# Patient Record
Sex: Female | Born: 1950 | Race: White | Hispanic: No | Smoking: Never smoker
Health system: Southern US, Community
[De-identification: ages and names within clinical notes are randomized; demographics above are authoritative.]

## PROBLEM LIST (undated history)

## (undated) ENCOUNTER — Emergency Department (HOSPITAL_COMMUNITY): Payer: No Typology Code available for payment source

## (undated) DIAGNOSIS — M1990 Unspecified osteoarthritis, unspecified site: Secondary | ICD-10-CM

## (undated) DIAGNOSIS — Z8572 Personal history of non-Hodgkin lymphomas: Secondary | ICD-10-CM

## (undated) DIAGNOSIS — H539 Unspecified visual disturbance: Secondary | ICD-10-CM

## (undated) DIAGNOSIS — F32A Depression, unspecified: Secondary | ICD-10-CM

## (undated) DIAGNOSIS — T7840XA Allergy, unspecified, initial encounter: Secondary | ICD-10-CM

## (undated) DIAGNOSIS — I10 Essential (primary) hypertension: Secondary | ICD-10-CM

## (undated) DIAGNOSIS — R51 Headache: Secondary | ICD-10-CM

## (undated) DIAGNOSIS — F419 Anxiety disorder, unspecified: Secondary | ICD-10-CM

## (undated) DIAGNOSIS — R011 Cardiac murmur, unspecified: Secondary | ICD-10-CM

## (undated) DIAGNOSIS — C801 Malignant (primary) neoplasm, unspecified: Secondary | ICD-10-CM

## (undated) DIAGNOSIS — R519 Headache, unspecified: Secondary | ICD-10-CM

## (undated) HISTORY — DX: Allergy, unspecified, initial encounter: T78.40XA

## (undated) HISTORY — DX: Cardiac murmur, unspecified: R01.1

## (undated) HISTORY — DX: Unspecified osteoarthritis, unspecified site: M19.90

## (undated) HISTORY — DX: Headache: R51

## (undated) HISTORY — PX: ABDOMINAL HYSTERECTOMY: SHX81

## (undated) HISTORY — DX: Headache, unspecified: R51.9

## (undated) HISTORY — PX: COLECTOMY: SHX59

## (undated) HISTORY — DX: Depression, unspecified: F32.A

## (undated) HISTORY — DX: Anxiety disorder, unspecified: F41.9

## (undated) HISTORY — DX: Unspecified visual disturbance: H53.9

## (undated) HISTORY — DX: Personal history of non-Hodgkin lymphomas: Z85.72

## (undated) HISTORY — PX: APPENDECTOMY: SHX54

---

## 1997-10-30 ENCOUNTER — Ambulatory Visit: Admission: RE | Admit: 1997-10-30 | Discharge: 1997-10-30 | Payer: Self-pay | Admitting: Family Medicine

## 1998-07-15 ENCOUNTER — Ambulatory Visit (HOSPITAL_COMMUNITY): Admission: RE | Admit: 1998-07-15 | Discharge: 1998-07-15 | Payer: Self-pay | Admitting: Obstetrics and Gynecology

## 1998-07-15 ENCOUNTER — Encounter: Payer: Self-pay | Admitting: Obstetrics and Gynecology

## 1999-08-10 ENCOUNTER — Encounter: Payer: Self-pay | Admitting: Obstetrics and Gynecology

## 1999-08-10 ENCOUNTER — Ambulatory Visit (HOSPITAL_COMMUNITY): Admission: RE | Admit: 1999-08-10 | Discharge: 1999-08-10 | Payer: Self-pay | Admitting: Obstetrics and Gynecology

## 2000-08-29 ENCOUNTER — Ambulatory Visit (HOSPITAL_COMMUNITY): Admission: RE | Admit: 2000-08-29 | Discharge: 2000-08-29 | Payer: Self-pay | Admitting: Obstetrics and Gynecology

## 2000-08-29 ENCOUNTER — Encounter: Payer: Self-pay | Admitting: Obstetrics and Gynecology

## 2001-09-01 ENCOUNTER — Ambulatory Visit (HOSPITAL_COMMUNITY): Admission: RE | Admit: 2001-09-01 | Discharge: 2001-09-01 | Payer: Self-pay | Admitting: Obstetrics & Gynecology

## 2001-09-01 ENCOUNTER — Encounter: Payer: Self-pay | Admitting: Obstetrics & Gynecology

## 2001-09-11 ENCOUNTER — Encounter: Admission: RE | Admit: 2001-09-11 | Discharge: 2001-09-11 | Payer: Self-pay | Admitting: Obstetrics & Gynecology

## 2001-09-11 ENCOUNTER — Encounter: Payer: Self-pay | Admitting: Family Medicine

## 2002-09-28 ENCOUNTER — Encounter: Payer: Self-pay | Admitting: Family Medicine

## 2002-09-28 ENCOUNTER — Ambulatory Visit (HOSPITAL_COMMUNITY): Admission: RE | Admit: 2002-09-28 | Discharge: 2002-09-28 | Payer: Self-pay | Admitting: Family Medicine

## 2006-01-28 ENCOUNTER — Emergency Department (HOSPITAL_COMMUNITY): Admission: EM | Admit: 2006-01-28 | Discharge: 2006-01-28 | Payer: Self-pay | Admitting: Emergency Medicine

## 2006-03-03 ENCOUNTER — Ambulatory Visit (HOSPITAL_COMMUNITY): Admission: RE | Admit: 2006-03-03 | Discharge: 2006-03-03 | Payer: Self-pay | Admitting: Obstetrics & Gynecology

## 2007-03-07 ENCOUNTER — Ambulatory Visit (HOSPITAL_COMMUNITY): Admission: RE | Admit: 2007-03-07 | Discharge: 2007-03-07 | Payer: Self-pay | Admitting: Family Medicine

## 2008-03-12 ENCOUNTER — Ambulatory Visit (HOSPITAL_COMMUNITY): Admission: RE | Admit: 2008-03-12 | Discharge: 2008-03-12 | Payer: Self-pay | Admitting: Family Medicine

## 2008-07-23 ENCOUNTER — Encounter: Admission: RE | Admit: 2008-07-23 | Discharge: 2008-07-23 | Payer: Self-pay | Admitting: Family Medicine

## 2009-03-13 ENCOUNTER — Ambulatory Visit (HOSPITAL_COMMUNITY): Admission: RE | Admit: 2009-03-13 | Discharge: 2009-03-13 | Payer: Self-pay | Admitting: Family Medicine

## 2009-04-08 ENCOUNTER — Emergency Department (HOSPITAL_COMMUNITY): Admission: EM | Admit: 2009-04-08 | Discharge: 2009-04-08 | Payer: Self-pay | Admitting: Emergency Medicine

## 2009-04-18 ENCOUNTER — Encounter: Admission: RE | Admit: 2009-04-18 | Discharge: 2009-04-18 | Payer: Self-pay | Admitting: Gastroenterology

## 2009-04-30 ENCOUNTER — Ambulatory Visit: Payer: Self-pay | Admitting: Hematology & Oncology

## 2009-05-05 ENCOUNTER — Ambulatory Visit: Payer: Self-pay | Admitting: Oncology

## 2009-05-06 ENCOUNTER — Ambulatory Visit: Payer: Self-pay | Admitting: Oncology

## 2009-05-07 LAB — CBC & DIFF AND RETIC
BASO%: 0.7 % (ref 0.0–2.0)
Basophils Absolute: 0.1 10*3/uL (ref 0.0–0.1)
EOS%: 2.5 % (ref 0.0–7.0)
Eosinophils Absolute: 0.2 10*3/uL (ref 0.0–0.5)
HCT: 34.3 % — ABNORMAL LOW (ref 34.8–46.6)
HGB: 9.9 g/dL — ABNORMAL LOW (ref 11.6–15.9)
Immature Retic Fract: 24.8 % — ABNORMAL HIGH (ref 0.00–10.70)
LYMPH%: 30.4 % (ref 14.0–49.7)
MCH: 19 pg — ABNORMAL LOW (ref 25.1–34.0)
MCHC: 28.9 g/dL — ABNORMAL LOW (ref 31.5–36.0)
MCV: 65.8 fL — ABNORMAL LOW (ref 79.5–101.0)
MONO#: 0.5 10*3/uL (ref 0.1–0.9)
MONO%: 5.6 % (ref 0.0–14.0)
NEUT#: 5.6 10*3/uL (ref 1.5–6.5)
NEUT%: 60.8 % (ref 38.4–76.8)
Platelets: 440 10*3/uL — ABNORMAL HIGH (ref 145–400)
RBC: 5.21 10*6/uL (ref 3.70–5.45)
RDW: 25 % — ABNORMAL HIGH (ref 11.2–14.5)
Retic %: 1.9 % — ABNORMAL HIGH (ref 0.50–1.50)
Retic Ct Abs: 98.99 10*3/uL — ABNORMAL HIGH (ref 18.30–72.70)
WBC: 9.2 10*3/uL (ref 3.9–10.3)
lymph#: 2.8 10*3/uL (ref 0.9–3.3)

## 2009-05-07 LAB — MORPHOLOGY: PLT EST: INCREASED

## 2009-05-07 LAB — PROTIME-INR
INR: 1.1 — ABNORMAL LOW (ref 2.00–3.50)
Protime: 13.2 Seconds (ref 10.6–13.4)

## 2009-05-08 ENCOUNTER — Other Ambulatory Visit: Admission: RE | Admit: 2009-05-08 | Discharge: 2009-05-08 | Payer: Self-pay | Admitting: Oncology

## 2009-05-08 LAB — SEDIMENTATION RATE: Sed Rate: 30 mm/hr — ABNORMAL HIGH (ref 0–22)

## 2009-05-09 LAB — EPSTEIN-BARR VIRUS NUCLEAR ANTIGEN ANTIBODY, IGG: EBV NA IgG: 0.38 {ISR}

## 2009-05-09 LAB — COMPREHENSIVE METABOLIC PANEL
ALT: 12 U/L (ref 0–35)
AST: 16 U/L (ref 0–37)
Albumin: 4.2 g/dL (ref 3.5–5.2)
Alkaline Phosphatase: 87 U/L (ref 39–117)
BUN: 9 mg/dL (ref 6–23)
CO2: 27 mEq/L (ref 19–32)
Calcium: 9.4 mg/dL (ref 8.4–10.5)
Chloride: 98 mEq/L (ref 96–112)
Creatinine, Ser: 0.81 mg/dL (ref 0.40–1.20)
Glucose, Bld: 110 mg/dL — ABNORMAL HIGH (ref 70–99)
Potassium: 3.6 mEq/L (ref 3.5–5.3)
Sodium: 138 mEq/L (ref 135–145)
Total Bilirubin: 0.4 mg/dL (ref 0.3–1.2)
Total Protein: 7.5 g/dL (ref 6.0–8.3)

## 2009-05-09 LAB — EPSTEIN-BARR VIRUS VCA, IGM: EBV VCA IgM: 0.05 {ISR}

## 2009-05-09 LAB — HEPATITIS B SURFACE ANTIGEN: Hepatitis B Surface Ag: NEGATIVE

## 2009-05-09 LAB — HEPATITIS C ANTIBODY: HCV Ab: NEGATIVE

## 2009-05-09 LAB — CMV IGM: CMV IgM: <8 AU/mL (ref <30.0)

## 2009-05-09 LAB — LACTATE DEHYDROGENASE: LDH: 157 U/L (ref 94–250)

## 2009-05-09 LAB — HEPATITIS A ANTIBODY, IGM: Hep A IgM: NEGATIVE

## 2009-05-09 LAB — HIV ANTIBODY (ROUTINE TESTING W REFLEX)

## 2009-05-09 LAB — CYTOMEGALOVIRUS ANTIBODY, IGG: Cytomegalovirus Ab-IgG: <0.2 IU/mL (ref <0.4)

## 2009-05-09 LAB — BETA 2 MICROGLOBULIN, SERUM: Beta-2 Microglobulin: 2.68 mg/L — ABNORMAL HIGH (ref 1.01–1.73)

## 2009-05-09 LAB — URIC ACID: Uric Acid, Serum: 5.9 mg/dL (ref 2.4–7.0)

## 2009-05-09 LAB — EPSTEIN-BARR VIRUS VCA, IGG: EBV VCA IgG: 5.93 {ISR} — ABNORMAL HIGH

## 2009-05-09 LAB — EPSTEIN-BARR VIRUS EARLY D ANTIGEN ANTIBODY, IGG: EBV EA IgG: 1.42 {ISR} — ABNORMAL HIGH

## 2009-05-13 ENCOUNTER — Ambulatory Visit (HOSPITAL_COMMUNITY): Admission: RE | Admit: 2009-05-13 | Discharge: 2009-05-13 | Payer: Self-pay | Admitting: Oncology

## 2009-05-13 ENCOUNTER — Encounter: Payer: Self-pay | Admitting: Oncology

## 2009-05-14 ENCOUNTER — Inpatient Hospital Stay (HOSPITAL_COMMUNITY): Admission: RE | Admit: 2009-05-14 | Discharge: 2009-05-19 | Payer: Self-pay | Admitting: Surgery

## 2009-05-14 ENCOUNTER — Encounter (INDEPENDENT_AMBULATORY_CARE_PROVIDER_SITE_OTHER): Payer: Self-pay | Admitting: Surgery

## 2009-06-05 ENCOUNTER — Ambulatory Visit: Payer: Self-pay | Admitting: Oncology

## 2009-06-05 ENCOUNTER — Ambulatory Visit (HOSPITAL_COMMUNITY): Admission: RE | Admit: 2009-06-05 | Discharge: 2009-06-05 | Payer: Self-pay | Admitting: Surgery

## 2009-06-06 LAB — CBC WITH DIFFERENTIAL/PLATELET
BASO%: 0.6 % (ref 0.0–2.0)
Basophils Absolute: 0.1 10*3/uL (ref 0.0–0.1)
EOS%: 6.1 % (ref 0.0–7.0)
Eosinophils Absolute: 0.5 10*3/uL (ref 0.0–0.5)
HCT: 34.5 % — ABNORMAL LOW (ref 34.8–46.6)
HGB: 10.2 g/dL — ABNORMAL LOW (ref 11.6–15.9)
LYMPH%: 36.9 % (ref 14.0–49.7)
MCH: 20.4 pg — ABNORMAL LOW (ref 25.1–34.0)
MCHC: 29.6 g/dL — ABNORMAL LOW (ref 31.5–36.0)
MCV: 69 fL — ABNORMAL LOW (ref 79.5–101.0)
MONO#: 0.7 10*3/uL (ref 0.1–0.9)
MONO%: 8.5 % (ref 0.0–14.0)
NEUT#: 4.1 10*3/uL (ref 1.5–6.5)
NEUT%: 47.9 % (ref 38.4–76.8)
Platelets: 320 10*3/uL (ref 145–400)
RBC: 5 10*6/uL (ref 3.70–5.45)
RDW: 24.7 % — ABNORMAL HIGH (ref 11.2–14.5)
WBC: 8.5 10*3/uL (ref 3.9–10.3)
lymph#: 3.2 10*3/uL (ref 0.9–3.3)
nRBC: 0 % (ref 0–0)

## 2009-06-12 ENCOUNTER — Ambulatory Visit (HOSPITAL_COMMUNITY): Admission: RE | Admit: 2009-06-12 | Discharge: 2009-06-12 | Payer: Self-pay | Admitting: Oncology

## 2009-06-13 LAB — COMPREHENSIVE METABOLIC PANEL
ALT: 13 U/L (ref 0–35)
AST: 5 U/L (ref 0–37)
Albumin: 3.9 g/dL (ref 3.5–5.2)
Alkaline Phosphatase: 72 U/L (ref 39–117)
BUN: 10 mg/dL (ref 6–23)
CO2: 23 mEq/L (ref 19–32)
Calcium: 8.4 mg/dL (ref 8.4–10.5)
Chloride: 99 mEq/L (ref 96–112)
Creatinine, Ser: 0.75 mg/dL (ref 0.40–1.20)
Glucose, Bld: 186 mg/dL — ABNORMAL HIGH (ref 70–99)
Potassium: 3.9 mEq/L (ref 3.5–5.3)
Sodium: 136 mEq/L (ref 135–145)
Total Bilirubin: 0.9 mg/dL (ref 0.3–1.2)
Total Protein: 7 g/dL (ref 6.0–8.3)

## 2009-06-13 LAB — CBC WITH DIFFERENTIAL/PLATELET
BASO%: 1.4 % (ref 0.0–2.0)
Basophils Absolute: 0 10*3/uL (ref 0.0–0.1)
EOS%: 17.1 % — ABNORMAL HIGH (ref 0.0–7.0)
Eosinophils Absolute: 0.1 10*3/uL (ref 0.0–0.5)
HCT: 32.7 % — ABNORMAL LOW (ref 34.8–46.6)
HGB: 9.9 g/dL — ABNORMAL LOW (ref 11.6–15.9)
LYMPH%: 75.7 % — ABNORMAL HIGH (ref 14.0–49.7)
MCH: 20.6 pg — ABNORMAL LOW (ref 25.1–34.0)
MCHC: 30.3 g/dL — ABNORMAL LOW (ref 31.5–36.0)
MCV: 68.1 fL — ABNORMAL LOW (ref 79.5–101.0)
MONO#: 0 10*3/uL — ABNORMAL LOW (ref 0.1–0.9)
MONO%: 2.9 % (ref 0.0–14.0)
NEUT#: 0 10*3/uL — CL (ref 1.5–6.5)
NEUT%: 2.9 % — ABNORMAL LOW (ref 38.4–76.8)
Platelets: 91 10*3/uL — ABNORMAL LOW (ref 145–400)
RBC: 4.8 10*6/uL (ref 3.70–5.45)
RDW: 23.1 % — ABNORMAL HIGH (ref 11.2–14.5)
WBC: 0.7 10*3/uL — CL (ref 3.9–10.3)
lymph#: 0.5 10*3/uL — ABNORMAL LOW (ref 0.9–3.3)
nRBC: 0 % (ref 0–0)

## 2009-06-13 LAB — URIC ACID: Uric Acid, Serum: 2.4 mg/dL (ref 2.4–7.0)

## 2009-06-13 LAB — MORPHOLOGY: PLT EST: DECREASED

## 2009-06-13 LAB — LACTATE DEHYDROGENASE: LDH: 92 U/L — ABNORMAL LOW (ref 94–250)

## 2009-06-18 LAB — CBC WITH DIFFERENTIAL/PLATELET
BASO%: 1 % (ref 0.0–2.0)
Basophils Absolute: 0.1 10*3/uL (ref 0.0–0.1)
EOS%: 1 % (ref 0.0–7.0)
Eosinophils Absolute: 0.1 10*3/uL (ref 0.0–0.5)
HCT: 33.5 % — ABNORMAL LOW (ref 34.8–46.6)
HGB: 9.9 g/dL — ABNORMAL LOW (ref 11.6–15.9)
LYMPH%: 17.3 % (ref 14.0–49.7)
MCH: 20.8 pg — ABNORMAL LOW (ref 25.1–34.0)
MCHC: 29.6 g/dL — ABNORMAL LOW (ref 31.5–36.0)
MCV: 70.4 fL — ABNORMAL LOW (ref 79.5–101.0)
MONO#: 0.9 10*3/uL (ref 0.1–0.9)
MONO%: 7.2 % (ref 0.0–14.0)
NEUT#: 9 10*3/uL — ABNORMAL HIGH (ref 1.5–6.5)
NEUT%: 73.5 % (ref 38.4–76.8)
Platelets: 218 10*3/uL (ref 145–400)
RBC: 4.76 10*6/uL (ref 3.70–5.45)
RDW: 23.9 % — ABNORMAL HIGH (ref 11.2–14.5)
WBC: 12.2 10*3/uL — ABNORMAL HIGH (ref 3.9–10.3)
lymph#: 2.1 10*3/uL (ref 0.9–3.3)
nRBC: 1 % — ABNORMAL HIGH (ref 0–0)

## 2009-06-30 LAB — CBC WITH DIFFERENTIAL/PLATELET
BASO%: 3.8 % — ABNORMAL HIGH (ref 0.0–2.0)
Basophils Absolute: 0.3 10*3/uL — ABNORMAL HIGH (ref 0.0–0.1)
EOS%: 4.4 % (ref 0.0–7.0)
Eosinophils Absolute: 0.3 10*3/uL (ref 0.0–0.5)
HCT: 36 % (ref 34.8–46.6)
HGB: 11 g/dL — ABNORMAL LOW (ref 11.6–15.9)
LYMPH%: 26.1 % (ref 14.0–49.7)
MCH: 22 pg — ABNORMAL LOW (ref 25.1–34.0)
MCHC: 30.6 g/dL — ABNORMAL LOW (ref 31.5–36.0)
MCV: 72 fL — ABNORMAL LOW (ref 79.5–101.0)
MONO#: 0.7 10*3/uL (ref 0.1–0.9)
MONO%: 10.3 % (ref 0.0–14.0)
NEUT#: 3.7 10*3/uL (ref 1.5–6.5)
NEUT%: 55.4 % (ref 38.4–76.8)
Platelets: 492 10*3/uL — ABNORMAL HIGH (ref 145–400)
RBC: 5 10*6/uL (ref 3.70–5.45)
RDW: 24 % — ABNORMAL HIGH (ref 11.2–14.5)
WBC: 6.6 10*3/uL (ref 3.9–10.3)
lymph#: 1.7 10*3/uL (ref 0.9–3.3)
nRBC: 0 % (ref 0–0)

## 2009-07-04 LAB — COMPREHENSIVE METABOLIC PANEL
ALT: 9 U/L (ref 0–35)
AST: 9 U/L (ref 0–37)
Albumin: 3.8 g/dL (ref 3.5–5.2)
Alkaline Phosphatase: 78 U/L (ref 39–117)
BUN: 13 mg/dL (ref 6–23)
CO2: 25 mEq/L (ref 19–32)
Calcium: 8.6 mg/dL (ref 8.4–10.5)
Chloride: 103 mEq/L (ref 96–112)
Creatinine, Ser: 0.89 mg/dL (ref 0.40–1.20)
Glucose, Bld: 137 mg/dL — ABNORMAL HIGH (ref 70–99)
Potassium: 3.4 mEq/L — ABNORMAL LOW (ref 3.5–5.3)
Sodium: 140 mEq/L (ref 135–145)
Total Bilirubin: 0.5 mg/dL (ref 0.3–1.2)
Total Protein: 6.6 g/dL (ref 6.0–8.3)

## 2009-07-04 LAB — URIC ACID: Uric Acid, Serum: 2.7 mg/dL (ref 2.4–7.0)

## 2009-07-04 LAB — CBC WITH DIFFERENTIAL/PLATELET
BASO%: 1.4 % (ref 0.0–2.0)
Basophils Absolute: 0.5 10*3/uL — ABNORMAL HIGH (ref 0.0–0.1)
EOS%: 0 % (ref 0.0–7.0)
Eosinophils Absolute: 0 10*3/uL (ref 0.0–0.5)
HCT: 30.1 % — ABNORMAL LOW (ref 34.8–46.6)
HGB: 9.8 g/dL — ABNORMAL LOW (ref 11.6–15.9)
LYMPH%: 3.7 % — ABNORMAL LOW (ref 14.0–49.7)
MCH: 22.9 pg — ABNORMAL LOW (ref 25.1–34.0)
MCHC: 32.5 g/dL (ref 31.5–36.0)
MCV: 70.5 fL — ABNORMAL LOW (ref 79.5–101.0)
MONO#: 0.1 10*3/uL (ref 0.1–0.9)
MONO%: 0.2 % (ref 0.0–14.0)
NEUT#: 37.4 10*3/uL — ABNORMAL HIGH (ref 1.5–6.5)
NEUT%: 94.7 % — ABNORMAL HIGH (ref 38.4–76.8)
Platelets: 366 10*3/uL (ref 145–400)
RBC: 4.28 10*6/uL (ref 3.70–5.45)
RDW: 25.7 % — ABNORMAL HIGH (ref 11.2–14.5)
WBC: 39.5 10*3/uL — ABNORMAL HIGH (ref 3.9–10.3)
lymph#: 1.5 10*3/uL (ref 0.9–3.3)

## 2009-07-04 LAB — LACTATE DEHYDROGENASE: LDH: 148 U/L (ref 94–250)

## 2009-07-17 ENCOUNTER — Ambulatory Visit: Payer: Self-pay | Admitting: Oncology

## 2009-07-21 LAB — CBC WITH DIFFERENTIAL/PLATELET
BASO%: 2.6 % — ABNORMAL HIGH (ref 0.0–2.0)
Basophils Absolute: 0.2 10*3/uL — ABNORMAL HIGH (ref 0.0–0.1)
EOS%: 1.6 % (ref 0.0–7.0)
Eosinophils Absolute: 0.1 10*3/uL (ref 0.0–0.5)
HCT: 36.8 % (ref 34.8–46.6)
HGB: 11.2 g/dL — ABNORMAL LOW (ref 11.6–15.9)
LYMPH%: 23.7 % (ref 14.0–49.7)
MCH: 22.4 pg — ABNORMAL LOW (ref 25.1–34.0)
MCHC: 30.4 g/dL — ABNORMAL LOW (ref 31.5–36.0)
MCV: 73.6 fL — ABNORMAL LOW (ref 79.5–101.0)
MONO#: 0.6 10*3/uL (ref 0.1–0.9)
MONO%: 9.2 % (ref 0.0–14.0)
NEUT#: 3.9 10*3/uL (ref 1.5–6.5)
NEUT%: 62.9 % (ref 38.4–76.8)
Platelets: 391 10*3/uL (ref 145–400)
RBC: 5 10*6/uL (ref 3.70–5.45)
RDW: 22.6 % — ABNORMAL HIGH (ref 11.2–14.5)
WBC: 6.2 10*3/uL (ref 3.9–10.3)
lymph#: 1.5 10*3/uL (ref 0.9–3.3)

## 2009-08-04 LAB — CBC WITH DIFFERENTIAL/PLATELET
BASO%: 0.4 % (ref 0.0–2.0)
Basophils Absolute: 0 10*3/uL (ref 0.0–0.1)
EOS%: 0.9 % (ref 0.0–7.0)
Eosinophils Absolute: 0.1 10*3/uL (ref 0.0–0.5)
HCT: 33.8 % — ABNORMAL LOW (ref 34.8–46.6)
HGB: 11.2 g/dL — ABNORMAL LOW (ref 11.6–15.9)
LYMPH%: 9.5 % — ABNORMAL LOW (ref 14.0–49.7)
MCH: 24.2 pg — ABNORMAL LOW (ref 25.1–34.0)
MCHC: 33.1 g/dL (ref 31.5–36.0)
MCV: 72.9 fL — ABNORMAL LOW (ref 79.5–101.0)
MONO#: 0.6 10*3/uL (ref 0.1–0.9)
MONO%: 5.2 % (ref 0.0–14.0)
NEUT#: 9.6 10*3/uL — ABNORMAL HIGH (ref 1.5–6.5)
NEUT%: 84 % — ABNORMAL HIGH (ref 38.4–76.8)
Platelets: 243 10*3/uL (ref 145–400)
RBC: 4.64 10*6/uL (ref 3.70–5.45)
RDW: 25.1 % — ABNORMAL HIGH (ref 11.2–14.5)
WBC: 11.5 10*3/uL — ABNORMAL HIGH (ref 3.9–10.3)
lymph#: 1.1 10*3/uL (ref 0.9–3.3)

## 2009-08-04 LAB — COMPREHENSIVE METABOLIC PANEL
ALT: 14 U/L (ref 0–35)
AST: 12 U/L (ref 0–37)
Albumin: 4.1 g/dL (ref 3.5–5.2)
Alkaline Phosphatase: 86 U/L (ref 39–117)
BUN: 7 mg/dL (ref 6–23)
CO2: 23 mEq/L (ref 19–32)
Calcium: 9.1 mg/dL (ref 8.4–10.5)
Chloride: 105 mEq/L (ref 96–112)
Creatinine, Ser: 0.81 mg/dL (ref 0.40–1.20)
Glucose, Bld: 149 mg/dL — ABNORMAL HIGH (ref 70–99)
Potassium: 4 mEq/L (ref 3.5–5.3)
Sodium: 141 mEq/L (ref 135–145)
Total Bilirubin: 0.3 mg/dL (ref 0.3–1.2)
Total Protein: 6.9 g/dL (ref 6.0–8.3)

## 2009-08-04 LAB — LACTATE DEHYDROGENASE: LDH: 199 U/L (ref 94–250)

## 2009-08-11 LAB — CBC WITH DIFFERENTIAL/PLATELET
BASO%: 2.4 % — ABNORMAL HIGH (ref 0.0–2.0)
Basophils Absolute: 0.1 10*3/uL (ref 0.0–0.1)
EOS%: 1.7 % (ref 0.0–7.0)
Eosinophils Absolute: 0.1 10*3/uL (ref 0.0–0.5)
HCT: 36 % (ref 34.8–46.6)
HGB: 11.3 g/dL — ABNORMAL LOW (ref 11.6–15.9)
LYMPH%: 18.3 % (ref 14.0–49.7)
MCH: 23.6 pg — ABNORMAL LOW (ref 25.1–34.0)
MCHC: 31.4 g/dL — ABNORMAL LOW (ref 31.5–36.0)
MCV: 75.3 fL — ABNORMAL LOW (ref 79.5–101.0)
MONO#: 0.6 10*3/uL (ref 0.1–0.9)
MONO%: 10.9 % (ref 0.0–14.0)
NEUT#: 3.6 10*3/uL (ref 1.5–6.5)
NEUT%: 66.7 % (ref 38.4–76.8)
Platelets: 339 10*3/uL (ref 145–400)
RBC: 4.78 10*6/uL (ref 3.70–5.45)
RDW: 22.7 % — ABNORMAL HIGH (ref 11.2–14.5)
WBC: 5.4 10*3/uL (ref 3.9–10.3)
lymph#: 1 10*3/uL (ref 0.9–3.3)
nRBC: 0 % (ref 0–0)

## 2009-08-21 ENCOUNTER — Ambulatory Visit: Payer: Self-pay | Admitting: Oncology

## 2009-08-25 LAB — COMPREHENSIVE METABOLIC PANEL
ALT: 14 U/L (ref 0–35)
AST: 11 U/L (ref 0–37)
Albumin: 4 g/dL (ref 3.5–5.2)
Alkaline Phosphatase: 77 U/L (ref 39–117)
BUN: 6 mg/dL (ref 6–23)
CO2: 26 mEq/L (ref 19–32)
Calcium: 8.9 mg/dL (ref 8.4–10.5)
Chloride: 105 mEq/L (ref 96–112)
Creatinine, Ser: 0.92 mg/dL (ref 0.40–1.20)
Glucose, Bld: 135 mg/dL — ABNORMAL HIGH (ref 70–99)
Potassium: 3.9 mEq/L (ref 3.5–5.3)
Sodium: 142 mEq/L (ref 135–145)
Total Bilirubin: 0.4 mg/dL (ref 0.3–1.2)
Total Protein: 6.4 g/dL (ref 6.0–8.3)

## 2009-08-25 LAB — CBC WITH DIFFERENTIAL/PLATELET
BASO%: 0.5 % (ref 0.0–2.0)
Basophils Absolute: 0 10*3/uL (ref 0.0–0.1)
EOS%: 1.5 % (ref 0.0–7.0)
Eosinophils Absolute: 0.1 10*3/uL (ref 0.0–0.5)
HCT: 31.9 % — ABNORMAL LOW (ref 34.8–46.6)
HGB: 10.4 g/dL — ABNORMAL LOW (ref 11.6–15.9)
LYMPH%: 9.4 % — ABNORMAL LOW (ref 14.0–49.7)
MCH: 24.8 pg — ABNORMAL LOW (ref 25.1–34.0)
MCHC: 32.5 g/dL (ref 31.5–36.0)
MCV: 76.3 fL — ABNORMAL LOW (ref 79.5–101.0)
MONO#: 0.6 10*3/uL (ref 0.1–0.9)
MONO%: 6.9 % (ref 0.0–14.0)
NEUT#: 6.7 10*3/uL — ABNORMAL HIGH (ref 1.5–6.5)
NEUT%: 81.7 % — ABNORMAL HIGH (ref 38.4–76.8)
Platelets: 225 10*3/uL (ref 145–400)
RBC: 4.18 10*6/uL (ref 3.70–5.45)
RDW: 26 % — ABNORMAL HIGH (ref 11.2–14.5)
WBC: 8.2 10*3/uL (ref 3.9–10.3)
lymph#: 0.8 10*3/uL — ABNORMAL LOW (ref 0.9–3.3)

## 2009-08-25 LAB — LACTATE DEHYDROGENASE: LDH: 174 U/L (ref 94–250)

## 2009-09-01 LAB — CBC WITH DIFFERENTIAL/PLATELET
BASO%: 1.9 % (ref 0.0–2.0)
Basophils Absolute: 0.1 10*3/uL (ref 0.0–0.1)
EOS%: 1 % (ref 0.0–7.0)
Eosinophils Absolute: 0.1 10*3/uL (ref 0.0–0.5)
HCT: 39.2 % (ref 34.8–46.6)
HGB: 12.4 g/dL (ref 11.6–15.9)
LYMPH%: 18.2 % (ref 14.0–49.7)
MCH: 24.7 pg — ABNORMAL LOW (ref 25.1–34.0)
MCHC: 31.6 g/dL (ref 31.5–36.0)
MCV: 78.1 fL — ABNORMAL LOW (ref 79.5–101.0)
MONO#: 0.6 10*3/uL (ref 0.1–0.9)
MONO%: 8.8 % (ref 0.0–14.0)
NEUT#: 5.1 10*3/uL (ref 1.5–6.5)
NEUT%: 70.1 % (ref 38.4–76.8)
Platelets: 391 10*3/uL (ref 145–400)
RBC: 5.02 10*6/uL (ref 3.70–5.45)
RDW: 24.2 % — ABNORMAL HIGH (ref 11.2–14.5)
WBC: 7.3 10*3/uL (ref 3.9–10.3)
lymph#: 1.3 10*3/uL (ref 0.9–3.3)
nRBC: 0 % (ref 0–0)

## 2009-09-15 LAB — COMPREHENSIVE METABOLIC PANEL
ALT: 16 U/L (ref 0–35)
AST: 14 U/L (ref 0–37)
Albumin: 3.9 g/dL (ref 3.5–5.2)
Alkaline Phosphatase: 74 U/L (ref 39–117)
BUN: 4 mg/dL — ABNORMAL LOW (ref 6–23)
CO2: 27 mEq/L (ref 19–32)
Calcium: 8.5 mg/dL (ref 8.4–10.5)
Chloride: 106 mEq/L (ref 96–112)
Creatinine, Ser: 0.81 mg/dL (ref 0.40–1.20)
Glucose, Bld: 122 mg/dL — ABNORMAL HIGH (ref 70–99)
Potassium: 3.7 mEq/L (ref 3.5–5.3)
Sodium: 142 mEq/L (ref 135–145)
Total Bilirubin: 0.4 mg/dL (ref 0.3–1.2)
Total Protein: 5.9 g/dL — ABNORMAL LOW (ref 6.0–8.3)

## 2009-09-15 LAB — CBC WITH DIFFERENTIAL/PLATELET
BASO%: 0.3 % (ref 0.0–2.0)
Basophils Absolute: 0 10*3/uL (ref 0.0–0.1)
EOS%: 1.1 % (ref 0.0–7.0)
Eosinophils Absolute: 0.1 10*3/uL (ref 0.0–0.5)
HCT: 31.3 % — ABNORMAL LOW (ref 34.8–46.6)
HGB: 10.5 g/dL — ABNORMAL LOW (ref 11.6–15.9)
LYMPH%: 10.5 % — ABNORMAL LOW (ref 14.0–49.7)
MCH: 26.5 pg (ref 25.1–34.0)
MCHC: 33.6 g/dL (ref 31.5–36.0)
MCV: 78.8 fL — ABNORMAL LOW (ref 79.5–101.0)
MONO#: 0.6 10*3/uL (ref 0.1–0.9)
MONO%: 7.2 % (ref 0.0–14.0)
NEUT#: 7.2 10*3/uL — ABNORMAL HIGH (ref 1.5–6.5)
NEUT%: 80.9 % — ABNORMAL HIGH (ref 38.4–76.8)
Platelets: 223 10*3/uL (ref 145–400)
RBC: 3.97 10*6/uL (ref 3.70–5.45)
RDW: 26 % — ABNORMAL HIGH (ref 11.2–14.5)
WBC: 8.9 10*3/uL (ref 3.9–10.3)
lymph#: 0.9 10*3/uL (ref 0.9–3.3)

## 2009-09-15 LAB — LACTATE DEHYDROGENASE: LDH: 188 U/L (ref 94–250)

## 2009-09-22 ENCOUNTER — Ambulatory Visit: Payer: Self-pay | Admitting: Oncology

## 2009-09-22 LAB — CBC WITH DIFFERENTIAL/PLATELET
BASO%: 2.1 % — ABNORMAL HIGH (ref 0.0–2.0)
Basophils Absolute: 0.1 10*3/uL (ref 0.0–0.1)
EOS%: 1 % (ref 0.0–7.0)
Eosinophils Absolute: 0.1 10*3/uL (ref 0.0–0.5)
HCT: 37.6 % (ref 34.8–46.6)
HGB: 11.7 g/dL (ref 11.6–15.9)
LYMPH%: 19.9 % (ref 14.0–49.7)
MCH: 26.1 pg (ref 25.1–34.0)
MCHC: 31.1 g/dL — ABNORMAL LOW (ref 31.5–36.0)
MCV: 83.7 fL (ref 79.5–101.0)
MONO#: 0.5 10*3/uL (ref 0.1–0.9)
MONO%: 10.9 % (ref 0.0–14.0)
NEUT#: 3.2 10*3/uL (ref 1.5–6.5)
NEUT%: 66.1 % (ref 38.4–76.8)
Platelets: 296 10*3/uL (ref 145–400)
RBC: 4.49 10*6/uL (ref 3.70–5.45)
RDW: 22.7 % — ABNORMAL HIGH (ref 11.2–14.5)
WBC: 4.8 10*3/uL (ref 3.9–10.3)
lymph#: 1 10*3/uL (ref 0.9–3.3)
nRBC: 0 % (ref 0–0)

## 2009-10-09 ENCOUNTER — Ambulatory Visit (HOSPITAL_COMMUNITY): Admission: RE | Admit: 2009-10-09 | Discharge: 2009-10-09 | Payer: Self-pay | Admitting: Oncology

## 2009-10-09 LAB — COMPREHENSIVE METABOLIC PANEL
ALT: 19 U/L (ref 0–35)
AST: 18 U/L (ref 0–37)
Albumin: 4.2 g/dL (ref 3.5–5.2)
Alkaline Phosphatase: 71 U/L (ref 39–117)
BUN: 3 mg/dL — ABNORMAL LOW (ref 6–23)
CO2: 27 mEq/L (ref 19–32)
Calcium: 9.4 mg/dL (ref 8.4–10.5)
Chloride: 107 mEq/L (ref 96–112)
Creatinine, Ser: 0.86 mg/dL (ref 0.40–1.20)
Glucose, Bld: 117 mg/dL — ABNORMAL HIGH (ref 70–99)
Potassium: 4.3 mEq/L (ref 3.5–5.3)
Sodium: 141 mEq/L (ref 135–145)
Total Bilirubin: 0.5 mg/dL (ref 0.3–1.2)
Total Protein: 6.7 g/dL (ref 6.0–8.3)

## 2009-10-09 LAB — MORPHOLOGY: PLT EST: ADEQUATE

## 2009-10-09 LAB — CBC WITH DIFFERENTIAL/PLATELET
BASO%: 2.8 % — ABNORMAL HIGH (ref 0.0–2.0)
Basophils Absolute: 0.2 10*3/uL — ABNORMAL HIGH (ref 0.0–0.1)
EOS%: 0.5 % (ref 0.0–7.0)
Eosinophils Absolute: 0 10*3/uL (ref 0.0–0.5)
HCT: 35.5 % (ref 34.8–46.6)
HGB: 11.7 g/dL (ref 11.6–15.9)
LYMPH%: 9.2 % — ABNORMAL LOW (ref 14.0–49.7)
MCH: 27.4 pg (ref 25.1–34.0)
MCHC: 32.9 g/dL (ref 31.5–36.0)
MCV: 83.4 fL (ref 79.5–101.0)
MONO#: 0.4 10*3/uL (ref 0.1–0.9)
MONO%: 6.2 % (ref 0.0–14.0)
NEUT#: 5.5 10*3/uL (ref 1.5–6.5)
NEUT%: 81.3 % — ABNORMAL HIGH (ref 38.4–76.8)
Platelets: 373 10*3/uL (ref 145–400)
RBC: 4.26 10*6/uL (ref 3.70–5.45)
RDW: 21.9 % — ABNORMAL HIGH (ref 11.2–14.5)
WBC: 6.8 10*3/uL (ref 3.9–10.3)
lymph#: 0.6 10*3/uL — ABNORMAL LOW (ref 0.9–3.3)

## 2009-10-09 LAB — LACTATE DEHYDROGENASE: LDH: 247 U/L (ref 94–250)

## 2009-10-16 ENCOUNTER — Ambulatory Visit (HOSPITAL_COMMUNITY): Admission: RE | Admit: 2009-10-16 | Discharge: 2009-10-16 | Payer: Self-pay | Admitting: Oncology

## 2009-10-22 ENCOUNTER — Ambulatory Visit (HOSPITAL_COMMUNITY): Admission: RE | Admit: 2009-10-22 | Discharge: 2009-10-22 | Payer: Self-pay | Admitting: Surgery

## 2009-10-23 ENCOUNTER — Encounter: Payer: Self-pay | Admitting: Oncology

## 2009-10-23 ENCOUNTER — Ambulatory Visit: Admission: RE | Admit: 2009-10-23 | Discharge: 2009-10-23 | Payer: Self-pay | Admitting: Oncology

## 2009-10-23 ENCOUNTER — Ambulatory Visit: Payer: Self-pay | Admitting: Cardiology

## 2009-11-02 ENCOUNTER — Emergency Department (HOSPITAL_COMMUNITY): Admission: EM | Admit: 2009-11-02 | Discharge: 2009-11-02 | Payer: Self-pay | Admitting: Emergency Medicine

## 2010-02-14 ENCOUNTER — Other Ambulatory Visit: Payer: Self-pay | Admitting: Oncology

## 2010-02-14 DIAGNOSIS — C8590 Non-Hodgkin lymphoma, unspecified, unspecified site: Secondary | ICD-10-CM

## 2010-02-15 ENCOUNTER — Encounter: Payer: Self-pay | Admitting: Oncology

## 2010-03-02 ENCOUNTER — Other Ambulatory Visit (HOSPITAL_COMMUNITY): Payer: Self-pay | Admitting: Family Medicine

## 2010-03-02 DIAGNOSIS — Z1231 Encounter for screening mammogram for malignant neoplasm of breast: Secondary | ICD-10-CM

## 2010-03-16 ENCOUNTER — Ambulatory Visit (HOSPITAL_COMMUNITY): Payer: BC Managed Care – PPO

## 2010-03-17 ENCOUNTER — Ambulatory Visit (HOSPITAL_COMMUNITY)
Admission: RE | Admit: 2010-03-17 | Discharge: 2010-03-17 | Disposition: A | Discharge status: Home / Self Care | Payer: BC Managed Care – PPO | Source: Ambulatory Visit | Attending: Family Medicine | Admitting: Family Medicine

## 2010-03-17 DIAGNOSIS — Z1231 Encounter for screening mammogram for malignant neoplasm of breast: Secondary | ICD-10-CM

## 2010-03-31 ENCOUNTER — Other Ambulatory Visit: Payer: Self-pay | Admitting: Oncology

## 2010-03-31 ENCOUNTER — Ambulatory Visit (HOSPITAL_COMMUNITY)
Admission: RE | Admit: 2010-03-31 | Discharge: 2010-03-31 | Disposition: A | Discharge status: Home / Self Care | Payer: BC Managed Care – PPO | Source: Ambulatory Visit | Attending: Oncology | Admitting: Oncology

## 2010-03-31 ENCOUNTER — Encounter (HOSPITAL_BASED_OUTPATIENT_CLINIC_OR_DEPARTMENT_OTHER): Payer: BC Managed Care – PPO | Admitting: Oncology

## 2010-03-31 ENCOUNTER — Encounter (HOSPITAL_COMMUNITY): Payer: Self-pay

## 2010-03-31 DIAGNOSIS — Z5111 Encounter for antineoplastic chemotherapy: Secondary | ICD-10-CM

## 2010-03-31 DIAGNOSIS — Z5112 Encounter for antineoplastic immunotherapy: Secondary | ICD-10-CM

## 2010-03-31 DIAGNOSIS — C8589 Other specified types of non-Hodgkin lymphoma, extranodal and solid organ sites: Secondary | ICD-10-CM

## 2010-03-31 DIAGNOSIS — R634 Abnormal weight loss: Secondary | ICD-10-CM | POA: Insufficient documentation

## 2010-03-31 DIAGNOSIS — R1032 Left lower quadrant pain: Secondary | ICD-10-CM | POA: Insufficient documentation

## 2010-03-31 DIAGNOSIS — Z90710 Acquired absence of both cervix and uterus: Secondary | ICD-10-CM | POA: Insufficient documentation

## 2010-03-31 DIAGNOSIS — Z5189 Encounter for other specified aftercare: Secondary | ICD-10-CM

## 2010-03-31 DIAGNOSIS — Z9221 Personal history of antineoplastic chemotherapy: Secondary | ICD-10-CM | POA: Insufficient documentation

## 2010-03-31 DIAGNOSIS — C8590 Non-Hodgkin lymphoma, unspecified, unspecified site: Secondary | ICD-10-CM

## 2010-03-31 HISTORY — DX: Malignant (primary) neoplasm, unspecified: C80.1

## 2010-03-31 HISTORY — DX: Essential (primary) hypertension: I10

## 2010-03-31 LAB — MORPHOLOGY
PLT EST: ADEQUATE
RBC Comments: NORMAL

## 2010-03-31 LAB — CBC WITH DIFFERENTIAL/PLATELET
BASO%: 0.7 % (ref 0.0–2.0)
Basophils Absolute: 0 10*3/uL (ref 0.0–0.1)
EOS%: 3.3 % (ref 0.0–7.0)
Eosinophils Absolute: 0.2 10*3/uL (ref 0.0–0.5)
HCT: 44.5 % (ref 34.8–46.6)
HGB: 15.4 g/dL (ref 11.6–15.9)
LYMPH%: 17.9 % (ref 14.0–49.7)
MCH: 28.2 pg (ref 25.1–34.0)
MCHC: 34.5 g/dL (ref 31.5–36.0)
MCV: 81.8 fL (ref 79.5–101.0)
MONO#: 0.3 10*3/uL (ref 0.1–0.9)
MONO%: 6.8 % (ref 0.0–14.0)
NEUT#: 3.6 10*3/uL (ref 1.5–6.5)
NEUT%: 71.3 % (ref 38.4–76.8)
Platelets: 271 10*3/uL (ref 145–400)
RBC: 5.45 10*6/uL (ref 3.70–5.45)
RDW: 15.4 % — ABNORMAL HIGH (ref 11.2–14.5)
WBC: 5.1 10*3/uL (ref 3.9–10.3)
lymph#: 0.9 10*3/uL (ref 0.9–3.3)

## 2010-03-31 LAB — CMP (CANCER CENTER ONLY)
ALT(SGPT): 28 U/L (ref 10–47)
AST: 34 U/L (ref 11–38)
Albumin: 3.9 g/dL (ref 3.3–5.5)
Alkaline Phosphatase: 77 U/L (ref 26–84)
BUN, Bld: 8 mg/dL (ref 7–22)
CO2: 32 mEq/L (ref 18–33)
Calcium: 9.6 mg/dL (ref 8.0–10.3)
Chloride: 95 mEq/L — ABNORMAL LOW (ref 98–108)
Creat: 0.8 mg/dl (ref 0.6–1.2)
Glucose, Bld: 108 mg/dL (ref 73–118)
Potassium: 4.5 mEq/L (ref 3.3–4.7)
Sodium: 144 mEq/L (ref 128–145)
Total Bilirubin: 1 mg/dl (ref 0.20–1.60)
Total Protein: 7.9 g/dL (ref 6.4–8.1)

## 2010-03-31 LAB — LACTATE DEHYDROGENASE: LDH: 167 U/L (ref 94–250)

## 2010-03-31 LAB — SEDIMENTATION RATE: Sed Rate: 11 mm/hr (ref 0–22)

## 2010-03-31 MED ORDER — IOHEXOL 300 MG/ML  SOLN
100.0000 mL | Freq: Once | INTRAMUSCULAR | Status: AC | PRN
  Administered 2010-03-31: 100 mL via INTRAVENOUS

## 2010-04-07 ENCOUNTER — Encounter: Payer: BC Managed Care – PPO | Admitting: Oncology

## 2010-04-07 ENCOUNTER — Other Ambulatory Visit: Payer: Self-pay | Admitting: Oncology

## 2010-04-07 DIAGNOSIS — C8590 Non-Hodgkin lymphoma, unspecified, unspecified site: Secondary | ICD-10-CM

## 2010-04-09 LAB — CBC
HCT: 36.5 % (ref 36.0–46.0)
Hemoglobin: 12.1 g/dL (ref 12.0–15.0)
MCH: 27.6 pg (ref 26.0–34.0)
MCHC: 33.2 g/dL (ref 30.0–36.0)
MCV: 83.3 fL (ref 78.0–100.0)
Platelets: 245 10*3/uL (ref 150–400)
RBC: 4.39 MIL/uL (ref 3.87–5.11)
RDW: 17.9 % — ABNORMAL HIGH (ref 11.5–15.5)
WBC: 4.2 10*3/uL (ref 4.0–10.5)

## 2010-04-09 LAB — DIFFERENTIAL
Basophils Absolute: 0.1 10*3/uL (ref 0.0–0.1)
Basophils Relative: 1 % (ref 0–1)
Eosinophils Absolute: 0.3 10*3/uL (ref 0.0–0.7)
Eosinophils Relative: 7 % — ABNORMAL HIGH (ref 0–5)
Lymphocytes Relative: 25 % (ref 12–46)
Lymphs Abs: 1.1 10*3/uL (ref 0.7–4.0)
Monocytes Absolute: 0.5 10*3/uL (ref 0.1–1.0)
Monocytes Relative: 11 % (ref 3–12)
Neutro Abs: 2.4 10*3/uL (ref 1.7–7.7)
Neutrophils Relative %: 56 % (ref 43–77)

## 2010-04-09 LAB — POCT CARDIAC MARKERS
CKMB, poc: <1 ng/mL — ABNORMAL LOW (ref 1.0–8.0)
CKMB, poc: <1 ng/mL — ABNORMAL LOW (ref 1.0–8.0)
Myoglobin, poc: 32.2 ng/mL (ref 12–200)
Myoglobin, poc: 41.1 ng/mL (ref 12–200)
Troponin i, poc: <0.05 ng/mL (ref 0.00–0.09)
Troponin i, poc: <0.05 ng/mL (ref 0.00–0.09)

## 2010-04-09 LAB — BASIC METABOLIC PANEL
BUN: 8 mg/dL (ref 6–23)
CO2: 25 mEq/L (ref 19–32)
Calcium: 9.3 mg/dL (ref 8.4–10.5)
Chloride: 106 mEq/L (ref 96–112)
Creatinine, Ser: 0.77 mg/dL (ref 0.4–1.2)
GFR calc Af Amer: >60 mL/min (ref >60)
GFR calc non Af Amer: >60 mL/min (ref >60)
Glucose, Bld: 103 mg/dL — ABNORMAL HIGH (ref 70–99)
Potassium: 3.2 mEq/L — ABNORMAL LOW (ref 3.5–5.1)
Sodium: 140 mEq/L (ref 135–145)

## 2010-04-09 LAB — SURGICAL PCR SCREEN: Staphylococcus aureus: POSITIVE — AB

## 2010-04-09 LAB — GLUCOSE, CAPILLARY
Glucose-Capillary: 117 mg/dL — ABNORMAL HIGH (ref 70–99)
Glucose-Capillary: 127 mg/dL — ABNORMAL HIGH (ref 70–99)

## 2010-04-09 LAB — D-DIMER, QUANTITATIVE: D-Dimer, Quant: 0.76 ug/mL-FEU — ABNORMAL HIGH (ref 0.00–0.48)

## 2010-04-13 LAB — GLUCOSE, CAPILLARY: Glucose-Capillary: 104 mg/dL — ABNORMAL HIGH (ref 70–99)

## 2010-04-14 LAB — GLUCOSE, CAPILLARY
Glucose-Capillary: 120 mg/dL — ABNORMAL HIGH (ref 70–99)
Glucose-Capillary: 100 mg/dL — ABNORMAL HIGH (ref 70–99)
Glucose-Capillary: 110 mg/dL — ABNORMAL HIGH (ref 70–99)
Glucose-Capillary: 111 mg/dL — ABNORMAL HIGH (ref 70–99)
Glucose-Capillary: 111 mg/dL — ABNORMAL HIGH (ref 70–99)
Glucose-Capillary: 113 mg/dL — ABNORMAL HIGH (ref 70–99)
Glucose-Capillary: 120 mg/dL — ABNORMAL HIGH (ref 70–99)
Glucose-Capillary: 121 mg/dL — ABNORMAL HIGH (ref 70–99)
Glucose-Capillary: 127 mg/dL — ABNORMAL HIGH (ref 70–99)
Glucose-Capillary: 127 mg/dL — ABNORMAL HIGH (ref 70–99)
Glucose-Capillary: 128 mg/dL — ABNORMAL HIGH (ref 70–99)
Glucose-Capillary: 130 mg/dL — ABNORMAL HIGH (ref 70–99)
Glucose-Capillary: 136 mg/dL — ABNORMAL HIGH (ref 70–99)
Glucose-Capillary: 142 mg/dL — ABNORMAL HIGH (ref 70–99)
Glucose-Capillary: 146 mg/dL — ABNORMAL HIGH (ref 70–99)
Glucose-Capillary: 151 mg/dL — ABNORMAL HIGH (ref 70–99)
Glucose-Capillary: 160 mg/dL — ABNORMAL HIGH (ref 70–99)
Glucose-Capillary: 160 mg/dL — ABNORMAL HIGH (ref 70–99)
Glucose-Capillary: 170 mg/dL — ABNORMAL HIGH (ref 70–99)
Glucose-Capillary: 171 mg/dL — ABNORMAL HIGH (ref 70–99)
Glucose-Capillary: 178 mg/dL — ABNORMAL HIGH (ref 70–99)
Glucose-Capillary: 180 mg/dL — ABNORMAL HIGH (ref 70–99)
Glucose-Capillary: 208 mg/dL — ABNORMAL HIGH (ref 70–99)
Glucose-Capillary: 95 mg/dL (ref 70–99)
Glucose-Capillary: 99 mg/dL (ref 70–99)

## 2010-04-14 LAB — CROSSMATCH
ABO/RH(D): B POS
Antibody Screen: NEGATIVE

## 2010-04-14 LAB — CBC
HCT: 31.6 % — ABNORMAL LOW (ref 36.0–46.0)
HCT: 27.4 % — ABNORMAL LOW (ref 36.0–46.0)
HCT: 28.9 % — ABNORMAL LOW (ref 36.0–46.0)
Hemoglobin: 10.1 g/dL — ABNORMAL LOW (ref 12.0–15.0)
Hemoglobin: 8.5 g/dL — ABNORMAL LOW (ref 12.0–15.0)
Hemoglobin: 8.8 g/dL — ABNORMAL LOW (ref 12.0–15.0)
MCHC: 32 g/dL (ref 30.0–36.0)
MCHC: 30.6 g/dL (ref 30.0–36.0)
MCHC: 30.9 g/dL (ref 30.0–36.0)
MCV: 66.5 fL — ABNORMAL LOW (ref 78.0–100.0)
MCV: 66 fL — ABNORMAL LOW (ref 78.0–100.0)
MCV: 66 fL — ABNORMAL LOW (ref 78.0–100.0)
Platelets: 346 10*3/uL (ref 150–400)
Platelets: 375 10*3/uL (ref 150–400)
Platelets: 379 10*3/uL (ref 150–400)
RBC: 4.76 MIL/uL (ref 3.87–5.11)
RBC: 4.15 MIL/uL (ref 3.87–5.11)
RBC: 4.38 MIL/uL (ref 3.87–5.11)
RDW: 28.5 % — ABNORMAL HIGH (ref 11.5–15.5)
RDW: 31 % — ABNORMAL HIGH (ref 11.5–15.5)
RDW: 31.3 % — ABNORMAL HIGH (ref 11.5–15.5)
WBC: 6.5 10*3/uL (ref 4.0–10.5)
WBC: 12.3 10*3/uL — ABNORMAL HIGH (ref 4.0–10.5)
WBC: 9.4 10*3/uL (ref 4.0–10.5)

## 2010-04-14 LAB — BASIC METABOLIC PANEL
BUN: 5 mg/dL — ABNORMAL LOW (ref 6–23)
CO2: 29 mEq/L (ref 19–32)
Calcium: 9 mg/dL (ref 8.4–10.5)
Chloride: 107 mEq/L (ref 96–112)
Creatinine, Ser: 0.91 mg/dL (ref 0.4–1.2)
GFR calc Af Amer: >60 mL/min (ref >60)
GFR calc non Af Amer: >60 mL/min (ref >60)
Glucose, Bld: 109 mg/dL — ABNORMAL HIGH (ref 70–99)
Potassium: 3.7 mEq/L (ref 3.5–5.1)
Sodium: 140 mEq/L (ref 135–145)

## 2010-04-14 LAB — ABO/RH: ABO/RH(D): B POS

## 2010-04-15 LAB — BONE MARROW EXAM: Bone Marrow Exam: 277

## 2010-04-15 LAB — TISSUE HYBRIDIZATION (BONE MARROW)-NCBH

## 2010-04-15 LAB — CHROMOSOME ANALYSIS, BONE MARROW

## 2010-04-20 LAB — LIPASE, BLOOD: Lipase: 40 U/L (ref 11–59)

## 2010-04-20 LAB — COMPREHENSIVE METABOLIC PANEL
ALT: 13 U/L (ref 0–35)
AST: 17 U/L (ref 0–37)
Albumin: 3.6 g/dL (ref 3.5–5.2)
Alkaline Phosphatase: 82 U/L (ref 39–117)
BUN: 5 mg/dL — ABNORMAL LOW (ref 6–23)
CO2: 29 mEq/L (ref 19–32)
Calcium: 8.9 mg/dL (ref 8.4–10.5)
Chloride: 102 mEq/L (ref 96–112)
Creatinine, Ser: 0.84 mg/dL (ref 0.4–1.2)
GFR calc Af Amer: >60 mL/min (ref >60)
GFR calc non Af Amer: >60 mL/min (ref >60)
Glucose, Bld: 120 mg/dL — ABNORMAL HIGH (ref 70–99)
Potassium: 3.7 mEq/L (ref 3.5–5.1)
Sodium: 139 mEq/L (ref 135–145)
Total Bilirubin: 0.6 mg/dL (ref 0.3–1.2)
Total Protein: 7.5 g/dL (ref 6.0–8.3)

## 2010-04-20 LAB — DIFFERENTIAL
Band Neutrophils: 0 % (ref 0–10)
Basophils Absolute: 0 10*3/uL (ref 0.0–0.1)
Basophils Relative: 0 % (ref 0–1)
Eosinophils Absolute: 0 10*3/uL (ref 0.0–0.7)
Eosinophils Relative: 0 % (ref 0–5)
Lymphocytes Relative: 0 % — ABNORMAL LOW (ref 12–46)
Lymphs Abs: 0 10*3/uL — ABNORMAL LOW (ref 0.7–4.0)
Monocytes Absolute: 0 10*3/uL — ABNORMAL LOW (ref 0.1–1.0)
Monocytes Relative: 0 % — ABNORMAL LOW (ref 3–12)
Neutrophils Relative %: 0 % — ABNORMAL LOW (ref 43–77)
nRBC: 0 /100 WBC

## 2010-04-20 LAB — CBC
HCT: UNDETERMINED % (ref 36.0–46.0)
Hemoglobin: 8.6 g/dL — ABNORMAL LOW (ref 12.0–15.0)
MCHC: UNDETERMINED g/dL (ref 30.0–36.0)
MCV: UNDETERMINED fL (ref 78.0–100.0)
Platelets: 494 10*3/uL — ABNORMAL HIGH (ref 150–400)
RBC: UNDETERMINED MIL/uL (ref 3.87–5.11)
RDW: UNDETERMINED % (ref 11.5–15.5)
WBC: 8.3 10*3/uL (ref 4.0–10.5)

## 2010-08-20 ENCOUNTER — Encounter (HOSPITAL_BASED_OUTPATIENT_CLINIC_OR_DEPARTMENT_OTHER): Payer: BC Managed Care – PPO | Admitting: Oncology

## 2010-08-20 ENCOUNTER — Other Ambulatory Visit: Payer: Self-pay | Admitting: Oncology

## 2010-08-20 ENCOUNTER — Ambulatory Visit (HOSPITAL_COMMUNITY)
Admission: RE | Admit: 2010-08-20 | Discharge: 2010-08-20 | Disposition: A | Discharge status: Home / Self Care | Payer: BC Managed Care – PPO | Source: Ambulatory Visit | Attending: Oncology | Admitting: Oncology

## 2010-08-20 DIAGNOSIS — C8589 Other specified types of non-Hodgkin lymphoma, extranodal and solid organ sites: Secondary | ICD-10-CM | POA: Insufficient documentation

## 2010-08-20 DIAGNOSIS — C8590 Non-Hodgkin lymphoma, unspecified, unspecified site: Secondary | ICD-10-CM

## 2010-08-20 DIAGNOSIS — R1032 Left lower quadrant pain: Secondary | ICD-10-CM | POA: Insufficient documentation

## 2010-08-20 LAB — CBC WITH DIFFERENTIAL/PLATELET
BASO%: 0.8 % (ref 0.0–2.0)
Basophils Absolute: 0 10*3/uL (ref 0.0–0.1)
EOS%: 4.6 % (ref 0.0–7.0)
Eosinophils Absolute: 0.2 10*3/uL (ref 0.0–0.5)
HCT: 42.2 % (ref 34.8–46.6)
HGB: 14.6 g/dL (ref 11.6–15.9)
LYMPH%: 26.6 % (ref 14.0–49.7)
MCH: 29.6 pg (ref 25.1–34.0)
MCHC: 34.6 g/dL (ref 31.5–36.0)
MCV: 85.4 fL (ref 79.5–101.0)
MONO#: 0.4 10*3/uL (ref 0.1–0.9)
MONO%: 7.3 % (ref 0.0–14.0)
NEUT#: 3.2 10*3/uL (ref 1.5–6.5)
NEUT%: 60.7 % (ref 38.4–76.8)
Platelets: 250 10*3/uL (ref 145–400)
RBC: 4.95 10*6/uL (ref 3.70–5.45)
RDW: 13.1 % (ref 11.2–14.5)
WBC: 5.3 10*3/uL (ref 3.9–10.3)
lymph#: 1.4 10*3/uL (ref 0.9–3.3)

## 2010-08-20 LAB — MORPHOLOGY
PLT EST: ADEQUATE
RBC Comments: NORMAL

## 2010-08-20 LAB — CMP (CANCER CENTER ONLY)
ALT(SGPT): 25 U/L (ref 10–47)
AST: 31 U/L (ref 11–38)
Albumin: 3.6 g/dL (ref 3.3–5.5)
Alkaline Phosphatase: 81 U/L (ref 26–84)
BUN, Bld: 8 mg/dL (ref 7–22)
CO2: 30 mEq/L (ref 18–33)
Calcium: 9 mg/dL (ref 8.0–10.3)
Chloride: 97 mEq/L — ABNORMAL LOW (ref 98–108)
Creat: 0.7 mg/dl (ref 0.6–1.2)
Glucose, Bld: 109 mg/dL (ref 73–118)
Potassium: 4.2 mEq/L (ref 3.3–4.7)
Sodium: 144 mEq/L (ref 128–145)
Total Bilirubin: 1.4 mg/dl (ref 0.20–1.60)
Total Protein: 7.1 g/dL (ref 6.4–8.1)

## 2010-08-20 LAB — LACTATE DEHYDROGENASE: LDH: 175 U/L (ref 94–250)

## 2010-08-20 LAB — SEDIMENTATION RATE: Sed Rate: 13 mm/hr (ref 0–22)

## 2010-08-20 MED ORDER — IOHEXOL 300 MG/ML  SOLN
100.0000 mL | Freq: Once | INTRAMUSCULAR | Status: AC | PRN
  Administered 2010-08-20: 100 mL via INTRAVENOUS

## 2010-08-25 ENCOUNTER — Other Ambulatory Visit: Payer: Self-pay | Admitting: Oncology

## 2010-08-25 ENCOUNTER — Encounter (HOSPITAL_BASED_OUTPATIENT_CLINIC_OR_DEPARTMENT_OTHER): Payer: BC Managed Care – PPO | Admitting: Oncology

## 2010-08-25 DIAGNOSIS — C8590 Non-Hodgkin lymphoma, unspecified, unspecified site: Secondary | ICD-10-CM

## 2010-08-25 DIAGNOSIS — C8589 Other specified types of non-Hodgkin lymphoma, extranodal and solid organ sites: Secondary | ICD-10-CM

## 2010-12-09 ENCOUNTER — Telehealth: Payer: Self-pay

## 2010-12-09 NOTE — Telephone Encounter (Signed)
Received call from pt wanting to report to Dr Cyndie Chime that she has noticed an "enlarged area right above my left clavicle."  Pt reports it feels "bony; not like soft tissue."    Pt is scheduled for CT CAP tomorrow and wanted Dr Cyndie Chime to review this area when he receives scan results.   Note to MD.   dph

## 2010-12-10 ENCOUNTER — Telehealth: Payer: Self-pay | Admitting: *Deleted

## 2010-12-10 ENCOUNTER — Other Ambulatory Visit: Payer: Self-pay | Admitting: Oncology

## 2010-12-10 ENCOUNTER — Ambulatory Visit (HOSPITAL_COMMUNITY)
Admission: RE | Admit: 2010-12-10 | Discharge: 2010-12-10 | Disposition: A | Discharge status: Home / Self Care | Payer: BC Managed Care – PPO | Source: Ambulatory Visit | Attending: Oncology | Admitting: Oncology

## 2010-12-10 ENCOUNTER — Other Ambulatory Visit (HOSPITAL_BASED_OUTPATIENT_CLINIC_OR_DEPARTMENT_OTHER): Payer: BC Managed Care – PPO | Admitting: Lab

## 2010-12-10 DIAGNOSIS — C8589 Other specified types of non-Hodgkin lymphoma, extranodal and solid organ sites: Secondary | ICD-10-CM | POA: Insufficient documentation

## 2010-12-10 DIAGNOSIS — E876 Hypokalemia: Secondary | ICD-10-CM

## 2010-12-10 DIAGNOSIS — M47814 Spondylosis without myelopathy or radiculopathy, thoracic region: Secondary | ICD-10-CM | POA: Insufficient documentation

## 2010-12-10 DIAGNOSIS — M51379 Other intervertebral disc degeneration, lumbosacral region without mention of lumbar back pain or lower extremity pain: Secondary | ICD-10-CM | POA: Insufficient documentation

## 2010-12-10 DIAGNOSIS — C8590 Non-Hodgkin lymphoma, unspecified, unspecified site: Secondary | ICD-10-CM

## 2010-12-10 DIAGNOSIS — Z90710 Acquired absence of both cervix and uterus: Secondary | ICD-10-CM | POA: Insufficient documentation

## 2010-12-10 DIAGNOSIS — M5137 Other intervertebral disc degeneration, lumbosacral region: Secondary | ICD-10-CM | POA: Insufficient documentation

## 2010-12-10 LAB — CBC WITH DIFFERENTIAL/PLATELET
BASO%: 0.3 % (ref 0.0–2.0)
Basophils Absolute: 0 10*3/uL (ref 0.0–0.1)
EOS%: 4.4 % (ref 0.0–7.0)
Eosinophils Absolute: 0.3 10*3/uL (ref 0.0–0.5)
HCT: 42.9 % (ref 34.8–46.6)
HGB: 14.9 g/dL (ref 11.6–15.9)
LYMPH%: 30.8 % (ref 14.0–49.7)
MCH: 30.1 pg (ref 25.1–34.0)
MCHC: 34.8 g/dL (ref 31.5–36.0)
MCV: 86.3 fL (ref 79.5–101.0)
MONO#: 0.4 10*3/uL (ref 0.1–0.9)
MONO%: 6.8 % (ref 0.0–14.0)
NEUT#: 3.4 10*3/uL (ref 1.5–6.5)
NEUT%: 57.7 % (ref 38.4–76.8)
Platelets: 252 10*3/uL (ref 145–400)
RBC: 4.97 10*6/uL (ref 3.70–5.45)
RDW: 12.7 % (ref 11.2–14.5)
WBC: 5.8 10*3/uL (ref 3.9–10.3)
lymph#: 1.8 10*3/uL (ref 0.9–3.3)

## 2010-12-10 LAB — CMP (CANCER CENTER ONLY)
ALT(SGPT): 28 U/L (ref 10–47)
AST: 26 U/L (ref 11–38)
Albumin: 3.7 g/dL (ref 3.3–5.5)
Alkaline Phosphatase: 78 U/L (ref 26–84)
BUN, Bld: 8 mg/dL (ref 7–22)
CO2: 32 mEq/L (ref 18–33)
Calcium: 9.1 mg/dL (ref 8.0–10.3)
Chloride: 89 mEq/L — ABNORMAL LOW (ref 98–108)
Creat: 0.5 mg/dl — ABNORMAL LOW (ref 0.6–1.2)
Glucose, Bld: 118 mg/dL (ref 73–118)
Potassium: 3.1 mEq/L — ABNORMAL LOW (ref 3.3–4.7)
Sodium: 141 mEq/L (ref 128–145)
Total Bilirubin: 1.2 mg/dl (ref 0.20–1.60)
Total Protein: 7.5 g/dL (ref 6.4–8.1)

## 2010-12-10 LAB — LACTATE DEHYDROGENASE: LDH: 178 U/L (ref 94–250)

## 2010-12-10 MED ORDER — IOHEXOL 300 MG/ML  SOLN
100.0000 mL | Freq: Once | INTRAMUSCULAR | Status: AC | PRN
  Administered 2010-12-10: 100 mL via INTRAVENOUS

## 2010-12-10 MED ORDER — POTASSIUM CHLORIDE CRYS ER 20 MEQ PO TBCR: 20.0000 meq | EXTENDED_RELEASE_TABLET | Freq: Two times a day (BID) | ORAL | Status: DC

## 2010-12-10 NOTE — Telephone Encounter (Signed)
Pt. notified per Dr. Cyndie Chime that K+ low & to start K dur 20 meq bid.  I will call script to [facility], [address].

## 2011-02-08 ENCOUNTER — Ambulatory Visit (HOSPITAL_BASED_OUTPATIENT_CLINIC_OR_DEPARTMENT_OTHER): Payer: BC Managed Care – PPO | Admitting: Oncology

## 2011-02-08 ENCOUNTER — Ambulatory Visit (HOSPITAL_COMMUNITY)
Admission: RE | Admit: 2011-02-08 | Discharge: 2011-02-08 | Disposition: A | Discharge status: Home / Self Care | Payer: BC Managed Care – PPO | Source: Ambulatory Visit | Attending: Oncology | Admitting: Oncology

## 2011-02-08 ENCOUNTER — Telehealth: Payer: Self-pay

## 2011-02-08 ENCOUNTER — Encounter: Payer: Self-pay | Admitting: Oncology

## 2011-02-08 ENCOUNTER — Telehealth: Payer: Self-pay | Admitting: Oncology

## 2011-02-08 VITALS — BP 123/79 | HR 84 | Temp 97.8°F | Ht 66.0 in | Wt 195.8 lb

## 2011-02-08 DIAGNOSIS — F419 Anxiety disorder, unspecified: Secondary | ICD-10-CM

## 2011-02-08 DIAGNOSIS — C8589 Other specified types of non-Hodgkin lymphoma, extranodal and solid organ sites: Secondary | ICD-10-CM | POA: Insufficient documentation

## 2011-02-08 DIAGNOSIS — R229 Localized swelling, mass and lump, unspecified: Secondary | ICD-10-CM | POA: Insufficient documentation

## 2011-02-08 DIAGNOSIS — Z8572 Personal history of non-Hodgkin lymphomas: Secondary | ICD-10-CM | POA: Insufficient documentation

## 2011-02-08 DIAGNOSIS — C8590 Non-Hodgkin lymphoma, unspecified, unspecified site: Secondary | ICD-10-CM

## 2011-02-08 DIAGNOSIS — R079 Chest pain, unspecified: Secondary | ICD-10-CM | POA: Insufficient documentation

## 2011-02-08 DIAGNOSIS — F411 Generalized anxiety disorder: Secondary | ICD-10-CM

## 2011-02-08 HISTORY — DX: Personal history of non-Hodgkin lymphomas: Z85.72

## 2011-02-08 HISTORY — DX: Anxiety disorder, unspecified: F41.9

## 2011-02-08 NOTE — Telephone Encounter (Signed)
Sent pt to [facility] for xray of the sternum.  gv pt appt for march2013 and ct and cxr for 04/13/2011 @ 9am @ [facility]

## 2011-02-08 NOTE — Telephone Encounter (Signed)
Pt notified per Dr Cyndie Chime "xray normal."  Pt verbalizes understanding and appreciation. dph

## 2011-02-08 NOTE — Progress Notes (Signed)
Hematology and Oncology Follow Up Visit  Hannah Frazier [id number] [birth year] 61 y.o. 02/08/2011 7:28 PM   Principle Diagnosis: Encounter Diagnoses  Name Primary?  . NHL (non-Hodgkin's lymphoma) Yes  . Lymphoma, high grade   . 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 She underwent surgical resection 05/14/2009 followed by 6 cycles of CHOP Rituxan chemotherapy between May 13 and 09/22/2009. She's remained in a clinical and radiographic remission since that time including survey scans done in anticipation of today's visit on  12/10/2010.  She's had no interim medical problems. No fever or infection. She has noted asymmetric swelling in intermittent soreness at the sternal notch to the left side.  Medications: reviewed  Allergies:  Allergies  Allergen Reactions  . Lisinopril   . Simvastatin     Review of Systems: Constitutional:   No constitutional symptoms Respiratory: No dyspnea or cough Cardiovascular:  No chest pain or palpitations Gastrointestinal: No abdominal pain or change in bowel habit Genito-Urinary: No urinary tract symptoms Musculoskeletal: Pain in  upper sternal area Neurologic: No headache or change in vision Skin: No rash Remaining ROS negative.  Physical Exam: Blood pressure 123/79, pulse 84, temperature 97.8 F (36.6 C), temperature source Oral, height 5\' 6"  (1.676 m), weight 195 lb 12.8 oz (88.814 kg). Wt Readings from Last 3 Encounters:  02/08/11 195 lb 12.8 oz (88.814 kg)     General appearance: Well-nourished Caucasian woman HENNT: No erythema or exudate in the pharynx Lymph nodes: No cervical supraclavicular axillary or inguinal lymph nodes Breasts: Not examined Lungs: Clear to auscultation  resonant to percussion. Asymmetric prominence of the head of the sternum on the left with minimal tenderness on palpation no erythema skin lesion or skin breakdown. Heart: Regular cardiac rhythm no murmur Abdomen: Soft nontender no mass no organomegaly Extremities: No edema no calf tenderness Vascular: No cyanosis Neurologic: No focal deficit Skin: No rash or ecchymosis  Lab Results: Lab Results  Component Value Date   WBC 5.8 12/10/2010   HGB 14.9 12/10/2010   HCT 42.9 12/10/2010   MCV 86.3 12/10/2010   PLT 252 12/10/2010     Chemistry      Component Value Date/Time   NA 141 12/10/2010 0742   NA 140 11/02/2009 2004   K 3.1* 12/10/2010 0742   K 3.2* 11/02/2009 2004   CL 89* 12/10/2010 0742   CL 106 11/02/2009 2004   CO2 32 12/10/2010 0742   CO2 25 11/02/2009 2004   BUN 8 12/10/2010 0742   BUN 8 11/02/2009 2004   CREATININE 0.5* 12/10/2010 0742   CREATININE 0.77 11/02/2009 2004      Component Value Date/Time   CALCIUM 9.1 12/10/2010 0742   CALCIUM 9.3 11/02/2009 2004   ALKPHOS 78 12/10/2010 0742   ALKPHOS 71 10/09/2009 0735   AST 26 12/10/2010 0742   AST 18 10/09/2009 0735   ALT 19 10/09/2009 0735   BILITOT 1.20 12/10/2010 0742   BILITOT 0.5 10/09/2009 0735       Radiological Studies: I personally reviewed CT scan done November 15/ 12. No evidence for recurrent lymphoma.   Impression and Plan: #1. Extranodal high-grade B-cell non-Hodgkin's lymphoma involving the terminal ileum. Complete response to surgical resection followed by CHOP Rituxan chemotherapy. No  evidence for new disease now out almost 2 years from diagnosis in March of 2011.  #2. Soreness over the head of the left sternum. I doubt this is in anywhere related to her lymphoma. As means of reassurance I went ahead and sent her down for x-rays of the sternum and there are no abnormalities.  #3. Essential hypertension.  #4. Hyperlipidemia.  #5. Chronic anxiety and depression.   CC:. Dr. Benetta Spar Rankin's;  Dr. Luretha Murphy; Dr. Willis Modena   Levert Feinstein, MD 1/14/20137:28 PM

## 2011-03-04 ENCOUNTER — Other Ambulatory Visit (HOSPITAL_COMMUNITY): Payer: Self-pay | Admitting: Family Medicine

## 2011-03-04 DIAGNOSIS — Z1231 Encounter for screening mammogram for malignant neoplasm of breast: Secondary | ICD-10-CM

## 2011-03-31 ENCOUNTER — Ambulatory Visit (HOSPITAL_COMMUNITY)
Admission: RE | Admit: 2011-03-31 | Discharge: 2011-03-31 | Disposition: A | Discharge status: Home / Self Care | Payer: BC Managed Care – PPO | Source: Ambulatory Visit | Attending: Family Medicine | Admitting: Family Medicine

## 2011-03-31 DIAGNOSIS — Z1231 Encounter for screening mammogram for malignant neoplasm of breast: Secondary | ICD-10-CM

## 2011-04-13 ENCOUNTER — Ambulatory Visit (HOSPITAL_COMMUNITY)
Admission: RE | Admit: 2011-04-13 | Discharge: 2011-04-13 | Disposition: A | Discharge status: Home / Self Care | Payer: BC Managed Care – PPO | Source: Ambulatory Visit | Attending: Oncology | Admitting: Oncology

## 2011-04-13 ENCOUNTER — Other Ambulatory Visit (HOSPITAL_BASED_OUTPATIENT_CLINIC_OR_DEPARTMENT_OTHER): Payer: BC Managed Care – PPO | Admitting: Lab

## 2011-04-13 ENCOUNTER — Telehealth: Payer: Self-pay

## 2011-04-13 ENCOUNTER — Other Ambulatory Visit: Payer: Self-pay

## 2011-04-13 DIAGNOSIS — C8589 Other specified types of non-Hodgkin lymphoma, extranodal and solid organ sites: Secondary | ICD-10-CM | POA: Insufficient documentation

## 2011-04-13 DIAGNOSIS — I10 Essential (primary) hypertension: Secondary | ICD-10-CM | POA: Insufficient documentation

## 2011-04-13 DIAGNOSIS — C8590 Non-Hodgkin lymphoma, unspecified, unspecified site: Secondary | ICD-10-CM

## 2011-04-13 DIAGNOSIS — R222 Localized swelling, mass and lump, trunk: Secondary | ICD-10-CM | POA: Insufficient documentation

## 2011-04-13 LAB — CBC WITH DIFFERENTIAL/PLATELET
BASO%: 0.8 % (ref 0.0–2.0)
Basophils Absolute: 0.1 10*3/uL (ref 0.0–0.1)
EOS%: 4.4 % (ref 0.0–7.0)
Eosinophils Absolute: 0.3 10*3/uL (ref 0.0–0.5)
HCT: 42.2 % (ref 34.8–46.6)
HGB: 14.6 g/dL (ref 11.6–15.9)
LYMPH%: 29.5 % (ref 14.0–49.7)
MCH: 29.4 pg (ref 25.1–34.0)
MCHC: 34.6 g/dL (ref 31.5–36.0)
MCV: 85.1 fL (ref 79.5–101.0)
MONO#: 0.5 10*3/uL (ref 0.1–0.9)
MONO%: 7.3 % (ref 0.0–14.0)
NEUT#: 4.2 10*3/uL (ref 1.5–6.5)
NEUT%: 58 % (ref 38.4–76.8)
Platelets: 236 10*3/uL (ref 145–400)
RBC: 4.96 10*6/uL (ref 3.70–5.45)
RDW: 12.8 % (ref 11.2–14.5)
WBC: 7.3 10*3/uL (ref 3.9–10.3)
lymph#: 2.2 10*3/uL (ref 0.9–3.3)

## 2011-04-13 LAB — CMP (CANCER CENTER ONLY)
ALT(SGPT): 25 U/L (ref 10–47)
AST: 28 U/L (ref 11–38)
Albumin: 3.6 g/dL (ref 3.3–5.5)
Alkaline Phosphatase: 80 U/L (ref 26–84)
BUN, Bld: 10 mg/dL (ref 7–22)
CO2: 32 mEq/L (ref 18–33)
Calcium: 9.1 mg/dL (ref 8.0–10.3)
Chloride: 95 mEq/L — ABNORMAL LOW (ref 98–108)
Creat: 0.8 mg/dl (ref 0.6–1.2)
Glucose, Bld: 104 mg/dL (ref 73–118)
Potassium: 3.4 mEq/L (ref 3.3–4.7)
Sodium: 141 mEq/L (ref 128–145)
Total Bilirubin: 1.1 mg/dl (ref 0.20–1.60)
Total Protein: 7.5 g/dL (ref 6.4–8.1)

## 2011-04-13 LAB — MORPHOLOGY
PLT EST: ADEQUATE
RBC Comments: NORMAL

## 2011-04-13 LAB — LACTATE DEHYDROGENASE: LDH: 166 U/L (ref 94–250)

## 2011-04-13 MED ORDER — IOHEXOL 300 MG/ML  SOLN
100.0000 mL | Freq: Once | INTRAMUSCULAR | Status: AC | PRN
  Administered 2011-04-13: 100 mL via INTRAVENOUS

## 2011-04-13 NOTE — Telephone Encounter (Signed)
Pt notified of results per Dr Patsy Lager attached note. Verbalizes appreciation. dph

## 2011-04-13 NOTE — Telephone Encounter (Signed)
Message copied by Albertha Ghee on Tue Apr 13, 2011 12:31 PM ------      Message from: Levert Feinstein      Created: Tue Apr 13, 2011 12:23 PM       Call pt CT & CXR normal - no sign of lymphoma

## 2011-04-16 ENCOUNTER — Ambulatory Visit (HOSPITAL_BASED_OUTPATIENT_CLINIC_OR_DEPARTMENT_OTHER): Payer: BC Managed Care – PPO | Admitting: Nurse Practitioner

## 2011-04-16 ENCOUNTER — Telehealth: Payer: Self-pay | Admitting: Oncology

## 2011-04-16 VITALS — BP 134/88 | HR 78 | Temp 97.1°F | Ht 66.0 in | Wt 195.6 lb

## 2011-04-16 DIAGNOSIS — E785 Hyperlipidemia, unspecified: Secondary | ICD-10-CM

## 2011-04-16 DIAGNOSIS — I10 Essential (primary) hypertension: Secondary | ICD-10-CM

## 2011-04-16 DIAGNOSIS — C8589 Other specified types of non-Hodgkin lymphoma, extranodal and solid organ sites: Secondary | ICD-10-CM

## 2011-04-16 DIAGNOSIS — C8590 Non-Hodgkin lymphoma, unspecified, unspecified site: Secondary | ICD-10-CM

## 2011-04-16 DIAGNOSIS — F411 Generalized anxiety disorder: Secondary | ICD-10-CM

## 2011-04-16 NOTE — Telephone Encounter (Signed)
Gv pt appts fro sept2013.  scheduled cxr and ct scan for 09/17 @ [facility]

## 2011-04-16 NOTE — Progress Notes (Signed)
OFFICE PROGRESS NOTE  Interval history:  Hannah Frazier is a 61 year old woman diagnosed with stage IB extranodal high-grade B-cell non-Hodgkin's lymphoma in April 2011. She initially presented with abdominal pain and anemia. She was found to have a large soft tissue mass in the terminal ileum. She underwent surgical resection on 05/14/2009 followed by 6 cycles of CHOP/Rituxan chemotherapy between 06/06/2009 and 09/22/2009.  Restaging CT scans of the chest and abdomen on 04/13/2011 showed no evidence of active lymphoma. Chest x-ray, also on 04/13/2011, showed no acute or active cardiopulmonary or pleural abnormalities. No adenopathy or mass evident.  Hannah Frazier reports that overall she feels well. She is currently getting over a cold. No fevers or sweats. She has a good appetite. She is gaining weight. She continues to note swelling to the left of the sternum. She thinks the swelling is "less prominent". Bowels moving regularly. No abdominal pain. No hematuria or dysuria.   Objective: Blood pressure 134/88, pulse 78, temperature 97.1 F (36.2 C), temperature source Oral, height 5\' 6"  (1.676 m), weight 195 lb 9.6 oz (88.724 kg).  Oropharynx is without thrush or ulceration. No palpable cervical, supraclavicular, axillary or inguinal lymph nodes. Lungs are clear. No wheezes or rales. Regular cardiac rhythm. Abdomen is soft and nontender. No organomegaly. Extremities are without edema. Calves are soft and nontender. Slight fullness at the region of the left medial clavicle.  Lab Results: Lab Results  Component Value Date   WBC 7.3 04/13/2011   HGB 14.6 04/13/2011   HCT 42.2 04/13/2011   MCV 85.1 04/13/2011   PLT 236 04/13/2011    Chemistry:    Chemistry      Component Value Date/Time   NA 141 04/13/2011 0808   NA 140 11/02/2009 2004   K 3.4 04/13/2011 0808   K 3.2* 11/02/2009 2004   CL 95* 04/13/2011 0808   CL 106 11/02/2009 2004   CO2 32 04/13/2011 0808   CO2 25 11/02/2009 2004   BUN 10  04/13/2011 0808   BUN 8 11/02/2009 2004   CREATININE 0.8 04/13/2011 0808   CREATININE 0.77 11/02/2009 2004      Component Value Date/Time   CALCIUM 9.1 04/13/2011 0808   CALCIUM 9.3 11/02/2009 2004   ALKPHOS 80 04/13/2011 0808   ALKPHOS 71 10/09/2009 0735   AST 28 04/13/2011 0808   AST 18 10/09/2009 0735   ALT 19 10/09/2009 0735   BILITOT 1.10 04/13/2011 0808   BILITOT 0.5 10/09/2009 0735       Studies/Results: Dg Chest 2 View  04/13/2011  *RADIOLOGY REPORT*  Clinical Data: History of gastrointestinal lymphoma.  Followup. History of non-Hodgkins lymphoma.  History of knot medial to the left clavicle near junction with the sternum.  Hypertension.  CHEST - 2 VIEW  Comparison: 02/08/2011. 11/02/2009.  Findings: The cardiac silhouette is normal size and shape. Mediastinal and hilar contours appear stable.  No pulmonary infiltrates or nodules were evident. No pleural abnormality is evident.  No skeletal lesions are seen.  IMPRESSION: No acute or active cardiopulmonary or pleural abnormalities are seen.  No adenopathy or mass is evident.  History of knot medial to the left clavicle near junction with the sternum. I cannot visualize abnormality in this area by plain radiographic imaging.  Original Report Authenticated By: Crawford Givens, M.D.   Ct Abdomen Pelvis W Contrast  04/13/2011  *RADIOLOGY REPORT*  Clinical Data: Follow up of non-Hodgkins lymphoma of small bowel. Chemotherapy completed in 2011.  Partial bowel resection.  History of diabetes, hypertension, and  anxiety.  CT ABDOMEN AND PELVIS WITH CONTRAST  Technique:  Multidetector CT imaging of the abdomen and pelvis was performed following the standard protocol during bolus administration of intravenous contrast.  Contrast: OMNIPAQUE IOHEXOL 300 MG/ML IJ SOLN  Comparison: 12/10/2010  Findings: Minimal motion degradation at the lung bases. Clear lung bases.  Normal heart size without pericardial or pleural effusion. Normal liver, spleen, stomach,  pancreas, gallbladder, biliary tract, adrenal glands, kidneys.  Mildly age advanced atherosclerosis at the origin of bilateral renal arteries. No retroperitoneal or retrocrural adenopathy.  Normal colon and terminal ileum.  Normal small bowel without abdominal ascites.  No mesenteric adenopathy.  No pelvic adenopathy.  Air within the urinary bladder may be iatrogenic.  Hysterectomy.  No adnexal mass or significant free pelvic fluid. No acute osseous abnormality.  There is a prominent osteophyte posteriorly at L2-L3, causing central canal stenosis.  IMPRESSION: No acute process or evidence of active lymphoma within the abdomen or pelvis.  Air within urinary bladder, possibly iatrogenic.  Correlate with instrumentation.  Original Report Authenticated By: Consuello Bossier, M.D.   Mm Digital Screening  04/01/2011  DG SCREEN MAMMOGRAM BILATERAL Bilateral CC and MLO view(s) were taken.  DIGITAL SCREENING MAMMOGRAM WITH CAD:  Comparison:  Prior studies.  There are scattered fibroglandular densities.  There is no dominant mass, architectural distortion  or calcification to suggest malignancy.  Images were processed with CAD.  IMPRESSION:  No mammographic evidence of malignancy.  Suggest yearly screening mammography.  A result letter of this screening mammogram will be mailed directly to the patient.  ASSESSMENT: Negative - BI-RADS 1  Screening mammogram in 1 year. ,    Medications: I have reviewed the patient's current medications.  Assessment/Plan:  1. Non-Hodgkin's lymphoma, stage IB, extranodal diagnosed April 2011 presenting with abdominal pain and anemia. She was found to have a large soft tissue mass in the terminal ileum. She underwent surgical resection on 05/14/2009 followed by 6 cycles of CHOP/Rituxan chemotherapy between 06/06/2009 and 09/22/2009. 2. Essential hypertension. 3. Hyperlipidemia.  4. Chronic anxiety and depression.  Disposition-Hannah Frazier appears stable. She remains in remission from  the non-Hodgkin's lymphoma. She is now 2 years out from the initial diagnosis. She will return in 6 months for a followup visit with Dr. Cyndie Chime with restaging CT scans 1 week prior. She will contact the office in the interim with any problems.  Lonna Cobb ANP/GNP-BC

## 2011-05-26 ENCOUNTER — Telehealth: Payer: Self-pay | Admitting: *Deleted

## 2011-05-26 ENCOUNTER — Other Ambulatory Visit (HOSPITAL_COMMUNITY): Payer: Self-pay | Admitting: Orthopedic Surgery

## 2011-05-26 DIAGNOSIS — Z8572 Personal history of non-Hodgkin lymphomas: Secondary | ICD-10-CM

## 2011-05-26 DIAGNOSIS — M2540 Effusion, unspecified joint: Secondary | ICD-10-CM

## 2011-05-26 NOTE — Telephone Encounter (Signed)
Pt reports that she has had some swelling of Left clavicle & Dr. Cyndie Chime thinks this is from her arthritis.  Dr. Darrelyn Hillock wants her to have a CT of this area b/c the recent CT did not specifically show this area.  He thinks that it is also arthritis but can't be positive without CT.  She wants to know what Dr. Cyndie Chime thinks about the CT & would this be a problem in regard to too much radiation.  She can be reached at [phone].  Note to Dr. Cyndie Chime.

## 2011-05-27 ENCOUNTER — Telehealth: Payer: Self-pay | Admitting: *Deleted

## 2011-05-27 NOTE — Telephone Encounter (Signed)
Returned call to pt & informed that Dr.Granfortuna is OK with her having a CT & should not require that much radiation to evaluate a small area.

## 2011-05-28 ENCOUNTER — Ambulatory Visit (HOSPITAL_COMMUNITY)
Admission: RE | Admit: 2011-05-28 | Discharge: 2011-05-28 | Disposition: A | Discharge status: Home / Self Care | Payer: BC Managed Care – PPO | Source: Ambulatory Visit | Attending: Orthopedic Surgery | Admitting: Orthopedic Surgery

## 2011-05-28 DIAGNOSIS — M19019 Primary osteoarthritis, unspecified shoulder: Secondary | ICD-10-CM | POA: Insufficient documentation

## 2011-05-28 DIAGNOSIS — Z8572 Personal history of non-Hodgkin lymphomas: Secondary | ICD-10-CM

## 2011-05-28 DIAGNOSIS — M2540 Effusion, unspecified joint: Secondary | ICD-10-CM

## 2011-05-28 DIAGNOSIS — M25519 Pain in unspecified shoulder: Secondary | ICD-10-CM | POA: Insufficient documentation

## 2011-05-28 DIAGNOSIS — M898X9 Other specified disorders of bone, unspecified site: Secondary | ICD-10-CM | POA: Insufficient documentation

## 2011-10-11 ENCOUNTER — Other Ambulatory Visit: Payer: BC Managed Care – PPO | Admitting: Lab

## 2011-10-12 ENCOUNTER — Other Ambulatory Visit: Payer: BC Managed Care – PPO | Admitting: Lab

## 2011-10-12 ENCOUNTER — Ambulatory Visit (HOSPITAL_COMMUNITY)
Admission: RE | Admit: 2011-10-12 | Discharge: 2011-10-12 | Disposition: A | Discharge status: Home / Self Care | Payer: BC Managed Care – PPO | Source: Ambulatory Visit | Attending: Nurse Practitioner | Admitting: Nurse Practitioner

## 2011-10-12 ENCOUNTER — Other Ambulatory Visit (HOSPITAL_BASED_OUTPATIENT_CLINIC_OR_DEPARTMENT_OTHER): Payer: BC Managed Care – PPO

## 2011-10-12 DIAGNOSIS — C8590 Non-Hodgkin lymphoma, unspecified, unspecified site: Secondary | ICD-10-CM

## 2011-10-12 DIAGNOSIS — C8589 Other specified types of non-Hodgkin lymphoma, extranodal and solid organ sites: Secondary | ICD-10-CM

## 2011-10-12 DIAGNOSIS — R1032 Left lower quadrant pain: Secondary | ICD-10-CM | POA: Insufficient documentation

## 2011-10-12 DIAGNOSIS — N83209 Unspecified ovarian cyst, unspecified side: Secondary | ICD-10-CM | POA: Insufficient documentation

## 2011-10-12 LAB — COMPREHENSIVE METABOLIC PANEL (CC13)
ALT: 21 U/L (ref 0–55)
AST: 25 U/L (ref 5–34)
Albumin: 3.9 g/dL (ref 3.5–5.0)
Alkaline Phosphatase: 93 U/L (ref 40–150)
BUN: 9 mg/dL (ref 7.0–26.0)
CO2: 27 mEq/L (ref 22–29)
Calcium: 9.7 mg/dL (ref 8.4–10.4)
Chloride: 100 mEq/L (ref 98–107)
Creatinine: 0.9 mg/dL (ref 0.6–1.1)
Glucose: 121 mg/dl — ABNORMAL HIGH (ref 70–99)
Potassium: 3.5 mEq/L (ref 3.5–5.1)
Sodium: 139 mEq/L (ref 136–145)
Total Bilirubin: 1.3 mg/dL — ABNORMAL HIGH (ref 0.20–1.20)
Total Protein: 7.9 g/dL (ref 6.4–8.3)

## 2011-10-12 LAB — CBC WITH DIFFERENTIAL/PLATELET
BASO%: 1 % (ref 0.0–2.0)
Basophils Absolute: 0.1 10*3/uL (ref 0.0–0.1)
EOS%: 4.3 % (ref 0.0–7.0)
Eosinophils Absolute: 0.3 10*3/uL (ref 0.0–0.5)
HCT: 43 % (ref 34.8–46.6)
HGB: 14.7 g/dL (ref 11.6–15.9)
LYMPH%: 39.4 % (ref 14.0–49.7)
MCH: 29.2 pg (ref 25.1–34.0)
MCHC: 34.2 g/dL (ref 31.5–36.0)
MCV: 85.3 fL (ref 79.5–101.0)
MONO#: 0.4 10*3/uL (ref 0.1–0.9)
MONO%: 6.9 % (ref 0.0–14.0)
NEUT#: 3.1 10*3/uL (ref 1.5–6.5)
NEUT%: 48.4 % (ref 38.4–76.8)
Platelets: 241 10*3/uL (ref 145–400)
RBC: 5.04 10*6/uL (ref 3.70–5.45)
RDW: 13 % (ref 11.2–14.5)
WBC: 6.5 10*3/uL (ref 3.9–10.3)
lymph#: 2.6 10*3/uL (ref 0.9–3.3)

## 2011-10-12 LAB — LACTATE DEHYDROGENASE (CC13): LDH: 187 U/L (ref 125–220)

## 2011-10-12 MED ORDER — IOHEXOL 300 MG/ML  SOLN
100.0000 mL | Freq: Once | INTRAMUSCULAR | Status: AC | PRN
  Administered 2011-10-12: 100 mL via INTRAVENOUS

## 2011-10-13 ENCOUNTER — Telehealth: Payer: Self-pay | Admitting: *Deleted

## 2011-10-13 NOTE — Telephone Encounter (Signed)
Pt called yest & this am for report of labs & CT.  Returned call this am & reported per Dr Cyndie Chime that labs, CXR & CT good.

## 2011-10-13 NOTE — Telephone Encounter (Signed)
Message copied by Sabino Snipes on Wed Oct 13, 2011 10:00 AM ------      Message from: Levert Feinstein      Created: Wed Oct 13, 2011  8:58 AM       Call pt  CT negative

## 2011-10-18 ENCOUNTER — Telehealth: Payer: Self-pay | Admitting: Oncology

## 2011-10-18 ENCOUNTER — Ambulatory Visit (HOSPITAL_BASED_OUTPATIENT_CLINIC_OR_DEPARTMENT_OTHER): Payer: BC Managed Care – PPO | Admitting: Oncology

## 2011-10-18 VITALS — BP 131/85 | HR 80 | Temp 98.5°F | Resp 18 | Ht 66.5 in | Wt 200.8 lb

## 2011-10-18 DIAGNOSIS — C8510 Unspecified B-cell lymphoma, unspecified site: Secondary | ICD-10-CM

## 2011-10-18 DIAGNOSIS — C8589 Other specified types of non-Hodgkin lymphoma, extranodal and solid organ sites: Secondary | ICD-10-CM

## 2011-10-18 NOTE — Telephone Encounter (Signed)
gve the pt her march 2014 appt calendar along with the ct scan appt °

## 2011-10-18 NOTE — Progress Notes (Signed)
Hematology and Oncology Follow Up Visit  Hannah Frazier [id number] 1950/03/31 61 y.o. 10/18/2011 5:57 PM   Principle Diagnosis: Encounter Diagnosis  Name Primary?  . High Grade B-Cell Lymphoma Yes     Interim History:    Followup visit for this 61 year old woman with history of stage I B,  Extranodal,  high-grade B-cell non-Hodgkin's lymphoma diagnosed in April 2011 when she presented with abdominal pain and anemia and was found to have a large soft tissue mass in the terminal ileum. She underwent surgical resection 05/14/2009 followed by 6 cycles of CHOP Rituxan chemotherapy between May 13 and 09/22/2009. She has remained in a clinical and radiographic remission since that time including survey scans done in anticipation of today's visit on 10/12/2011 which I personally reviewed with her today.  She has had no interim medical problems. No infections.  Medications: reviewed  Allergies:  Allergies  Allergen Reactions  . Lisinopril   . Simvastatin     Review of Systems: Constitutional:  No constitutional symptoms  Respiratory: No cough or dyspnea Cardiovascular: No chest pain or palpitations  Gastrointestinal: No abdominal pain, cramps, change in bowel habit, or hematochezia Genito-Urinary: No urinary tract symptoms Musculoskeletal: No musculoskeletal complaints Neurologic: No headache or change in vision Skin: No rash or ecchymosis Remaining ROS negative.  Physical Exam: Blood pressure 131/85, pulse 80, temperature 98.5 F (36.9 C), temperature source Oral, resp. rate 18, height 5' 6.5" (1.689 m), weight 200 lb 12.8 oz (91.082 kg). Wt Readings from Last 3 Encounters:  10/18/11 200 lb 12.8 oz (91.082 kg)  04/16/11 195 lb 9.6 oz (88.724 kg)  02/08/11 195 lb 12.8 oz (88.814 kg)     General appearance: Well-nourished Caucasian woman HENNT: Pharynx no erythema or exudate Lymph nodes: No cervical, supraclavicular, axillary, or inguinal lymphadenopathy Breasts: Lungs:  Clear to auscultation resonant to percussion Heart: Regular rhythm no murmur Abdomen: Soft nontender, no mass, no organomegaly Extremities: No edema no calf tenderness Vascular: No cyanosis Neurologic: Motor strength 5 over 5, reflexes absent but symmetric at the knees 1+ symmetric at the biceps Skin: No rash or ecchymosis  Lab Results: Lab Results  Component Value Date   WBC 6.5 10/12/2011   HGB 14.7 10/12/2011   HCT 43.0 10/12/2011   MCV 85.3 10/12/2011   PLT 241 10/12/2011     Chemistry      Component Value Date/Time   NA 139 10/12/2011 0837   NA 141 04/13/2011 0808   NA 140 11/02/2009 2004   K 3.5 10/12/2011 0837   K 3.4 04/13/2011 0808   K 3.2* 11/02/2009 2004   CL 100 10/12/2011 0837   CL 95* 04/13/2011 0808   CL 106 11/02/2009 2004   CO2 27 10/12/2011 0837   CO2 32 04/13/2011 0808   CO2 25 11/02/2009 2004   BUN 9.0 10/12/2011 0837   BUN 10 04/13/2011 0808   BUN 8 11/02/2009 2004   CREATININE 0.9 10/12/2011 0837   CREATININE 0.8 04/13/2011 0808   CREATININE 0.77 11/02/2009 2004      Component Value Date/Time   CALCIUM 9.7 10/12/2011 0837   CALCIUM 9.1 04/13/2011 0808   CALCIUM 9.3 11/02/2009 2004   ALKPHOS 93 10/12/2011 0837   ALKPHOS 80 04/13/2011 0808   ALKPHOS 71 10/09/2009 0735   AST 25 10/12/2011 0837   AST 28 04/13/2011 0808   AST 18 10/09/2009 0735   ALT 21 10/12/2011 0837   ALT 19 10/09/2009 0735   BILITOT 1.30* 10/12/2011 0837   BILITOT 1.10 04/13/2011  1610   BILITOT 0.5 10/09/2009 0735       Radiological Studies: Dg Chest 2 View  10/12/2011  *RADIOLOGY REPORT*  Clinical Data: Non-Hodgkins lymphoma  CHEST - 2 VIEW  Comparison: 04/13/2011  Findings: Cardiomediastinal silhouette is stable.  No acute infiltrate or pulmonary edema.  Stable mild degenerative changes thoracic spine.  No adenopathy is noted.  IMPRESSION: No active disease.  No significant change.   Original Report Authenticated By: Natasha Mead, M.D.    Ct Abdomen Pelvis W Contrast  10/12/2011  *RADIOLOGY REPORT*   Clinical Data: Non-Hodgkins lymphoma.  History of partial colectomy.  Left lower quadrant abdominal pain.  CT ABDOMEN AND PELVIS WITH CONTRAST  Technique:  Multidetector CT imaging of the abdomen and pelvis was performed following the standard protocol during bolus administration of intravenous contrast.  Contrast: OMNIPAQUE IOHEXOL 300 MG/ML  SOLN  Comparison: CT scan 04/13/2011  Findings: The lung bases are clear.  No pulmonary nodules or pleural effusion.  There is mild diffuse fatty infiltration of the liver but no focal hepatic lesions or intrahepatic ductal dilatation.  The gallbladder is normal.  No common bile duct dilatation.  The pancreas is normal.  The spleen is normal in size.  No focal lesions.  The adrenal glands and kidneys are normal and stable.  The stomach, duodenum, small bowel and colon are unremarkable.  No wall thickening or inflammatory changes.  No mesenteric or retroperitoneal masses or adenopathy.  The aorta is normal in caliber.  The major branch vessels are patent.  The uterus is surgically absent.  The right ovary is still present and appears normal except for a small cyst.  The bladder is normal. No pelvic mass, adenopathy or free pelvic fluid collections.  No inguinal mass or adenopathy.  The bony structures are intact.  IMPRESSION: Unremarkable CT abdomen/pelvis.  No findings for recurrent lymphoma or acute abdominal/pelvic abnormality.   Original Report Authenticated By: P. Loralie Champagne, M.D.     Impression and Plan: #1. Stage IB extranodal high-grade B-cell non-Hodgkin's lymphoma treated as outlined above. She remains free of any obvious recurrence now out 2-1/2 years from diagnosis. Plan: I will see her again in 6 months and obtain scans and lab in advance. If the 3 year scans are stable, I was just follow her clinically subsequent to that.  #2. Essential hypertension  #3. Chronic anxiety  #4. Hyperlipidemia  CC:. Dr. Barton Fanny; Dr. Luretha Murphy; Dr.  Willis Modena   Levert Feinstein, MD 9/23/20135:57 PM he is

## 2011-11-01 ENCOUNTER — Encounter: Payer: Self-pay | Admitting: Oncology

## 2011-11-03 ENCOUNTER — Telehealth: Payer: Self-pay | Admitting: *Deleted

## 2011-11-03 NOTE — Telephone Encounter (Signed)
Dr. Cyndie Chime called patient two evenings and reached her successfully last night.  Instructed patient to contact her OB/GYN for evaluation due to her concern of the CT on 10-12-2011.

## 2012-01-25 ENCOUNTER — Other Ambulatory Visit: Payer: Self-pay | Admitting: Family Medicine

## 2012-01-25 DIAGNOSIS — N644 Mastodynia: Secondary | ICD-10-CM

## 2012-02-01 ENCOUNTER — Ambulatory Visit
Admission: RE | Admit: 2012-02-01 | Discharge: 2012-02-01 | Disposition: A | Discharge status: Home / Self Care | Payer: BC Managed Care – PPO | Source: Ambulatory Visit | Attending: Family Medicine | Admitting: Family Medicine

## 2012-02-01 DIAGNOSIS — N644 Mastodynia: Secondary | ICD-10-CM

## 2012-03-07 ENCOUNTER — Other Ambulatory Visit: Payer: Self-pay | Admitting: Family Medicine

## 2012-03-07 DIAGNOSIS — Z1231 Encounter for screening mammogram for malignant neoplasm of breast: Secondary | ICD-10-CM

## 2012-03-11 ENCOUNTER — Other Ambulatory Visit: Payer: Self-pay

## 2012-03-16 ENCOUNTER — Telehealth: Payer: Self-pay | Admitting: Oncology

## 2012-04-05 ENCOUNTER — Ambulatory Visit
Admission: RE | Admit: 2012-04-05 | Discharge: 2012-04-05 | Disposition: A | Discharge status: Home / Self Care | Payer: BC Managed Care – PPO | Source: Ambulatory Visit | Attending: Family Medicine | Admitting: Family Medicine

## 2012-04-05 DIAGNOSIS — Z1231 Encounter for screening mammogram for malignant neoplasm of breast: Secondary | ICD-10-CM

## 2012-04-10 ENCOUNTER — Other Ambulatory Visit (HOSPITAL_BASED_OUTPATIENT_CLINIC_OR_DEPARTMENT_OTHER): Payer: BC Managed Care – PPO | Admitting: Lab

## 2012-04-10 ENCOUNTER — Ambulatory Visit (HOSPITAL_COMMUNITY)
Admission: RE | Admit: 2012-04-10 | Discharge: 2012-04-10 | Disposition: A | Discharge status: Home / Self Care | Payer: BC Managed Care – PPO | Source: Ambulatory Visit | Attending: Oncology | Admitting: Oncology

## 2012-04-10 ENCOUNTER — Encounter (HOSPITAL_COMMUNITY): Payer: Self-pay

## 2012-04-10 ENCOUNTER — Other Ambulatory Visit: Payer: Self-pay | Admitting: Oncology

## 2012-04-10 DIAGNOSIS — I708 Atherosclerosis of other arteries: Secondary | ICD-10-CM | POA: Insufficient documentation

## 2012-04-10 DIAGNOSIS — C8510 Unspecified B-cell lymphoma, unspecified site: Secondary | ICD-10-CM

## 2012-04-10 DIAGNOSIS — C8589 Other specified types of non-Hodgkin lymphoma, extranodal and solid organ sites: Secondary | ICD-10-CM | POA: Insufficient documentation

## 2012-04-10 DIAGNOSIS — I7090 Unspecified atherosclerosis: Secondary | ICD-10-CM | POA: Insufficient documentation

## 2012-04-10 LAB — CBC WITH DIFFERENTIAL/PLATELET
BASO%: 1.1 % (ref 0.0–2.0)
Basophils Absolute: 0.1 10*3/uL (ref 0.0–0.1)
EOS%: 3.2 % (ref 0.0–7.0)
Eosinophils Absolute: 0.2 10*3/uL (ref 0.0–0.5)
HCT: 44.9 % (ref 34.8–46.6)
HGB: 15.7 g/dL (ref 11.6–15.9)
LYMPH%: 37.4 % (ref 14.0–49.7)
MCH: 29.6 pg (ref 25.1–34.0)
MCHC: 34.9 g/dL (ref 31.5–36.0)
MCV: 84.8 fL (ref 79.5–101.0)
MONO#: 0.4 10*3/uL (ref 0.1–0.9)
MONO%: 6.9 % (ref 0.0–14.0)
NEUT#: 3.3 10*3/uL (ref 1.5–6.5)
NEUT%: 51.4 % (ref 38.4–76.8)
Platelets: 254 10*3/uL (ref 145–400)
RBC: 5.3 10*6/uL (ref 3.70–5.45)
RDW: 13.2 % (ref 11.2–14.5)
WBC: 6.4 10*3/uL (ref 3.9–10.3)
lymph#: 2.4 10*3/uL (ref 0.9–3.3)

## 2012-04-10 LAB — COMPREHENSIVE METABOLIC PANEL (CC13)
ALT: 19 U/L (ref 0–55)
AST: 21 U/L (ref 5–34)
Albumin: 3.9 g/dL (ref 3.5–5.0)
Alkaline Phosphatase: 99 U/L (ref 40–150)
BUN: 11 mg/dL (ref 7.0–26.0)
CO2: 27 mEq/L (ref 22–29)
Calcium: 9.6 mg/dL (ref 8.4–10.4)
Chloride: 103 mEq/L (ref 98–107)
Creatinine: 0.9 mg/dL (ref 0.6–1.1)
Glucose: 111 mg/dl — ABNORMAL HIGH (ref 70–99)
Potassium: 3.9 mEq/L (ref 3.5–5.1)
Sodium: 140 mEq/L (ref 136–145)
Total Bilirubin: 1.13 mg/dL (ref 0.20–1.20)
Total Protein: 7.8 g/dL (ref 6.4–8.3)

## 2012-04-10 LAB — LACTATE DEHYDROGENASE (CC13): LDH: 193 U/L (ref 125–245)

## 2012-04-10 LAB — SEDIMENTATION RATE: Sed Rate: 12 mm/hr (ref 0–22)

## 2012-04-10 MED ORDER — IOHEXOL 300 MG/ML  SOLN
100.0000 mL | Freq: Once | INTRAMUSCULAR | Status: AC | PRN
  Administered 2012-04-10: 100 mL via INTRAVENOUS

## 2012-04-14 ENCOUNTER — Telehealth: Payer: Self-pay | Admitting: *Deleted

## 2012-04-14 NOTE — Telephone Encounter (Signed)
Message copied by Sabino Snipes on Fri Apr 14, 2012  6:48 PM ------      Message from: Levert Feinstein      Created: Tue Apr 11, 2012  8:12 AM       Call pt CT negative for lymphoma ------

## 2012-04-14 NOTE — Telephone Encounter (Signed)
Pt notified of negative CT report per Dr. Cyndie Chime.

## 2012-04-17 ENCOUNTER — Ambulatory Visit: Payer: BC Managed Care – PPO | Admitting: Oncology

## 2012-04-24 ENCOUNTER — Ambulatory Visit (HOSPITAL_BASED_OUTPATIENT_CLINIC_OR_DEPARTMENT_OTHER): Payer: BC Managed Care – PPO | Admitting: Oncology

## 2012-04-24 ENCOUNTER — Telehealth: Payer: Self-pay | Admitting: Oncology

## 2012-04-24 VITALS — BP 166/107 | HR 120 | Temp 99.8°F | Resp 20 | Ht 66.5 in | Wt 206.5 lb

## 2012-04-24 DIAGNOSIS — C8590 Non-Hodgkin lymphoma, unspecified, unspecified site: Secondary | ICD-10-CM

## 2012-04-24 DIAGNOSIS — C8589 Other specified types of non-Hodgkin lymphoma, extranodal and solid organ sites: Secondary | ICD-10-CM

## 2012-04-24 DIAGNOSIS — F419 Anxiety disorder, unspecified: Secondary | ICD-10-CM

## 2012-04-24 NOTE — Telephone Encounter (Signed)
Gave pt appt for September 2014 lab before and MD

## 2012-04-24 NOTE — Progress Notes (Signed)
Hematology and Oncology Follow Up Visit  Hannah Frazier [id number] [birth year] 62 y.o. 04/24/2012 7:30 PM   Principle Diagnosis: Encounter Diagnoses  Name Primary?  . Lymphoma, high grade Yes  . Anxiety      Interim History:   Routine followup visit for this 62 year old woman who is now out 3 years from diagnosis of high grade B-cell non-Hodgkin's lymphoma treated with standard 6 cycles of CHOP Rituxan following surgical resection of a large soft tissue mass in the terminal ileum on 05/14/2009. She achieved a complete response. CT scans done in anticipation of today's visit on 04/10/2012, which I personally reviewed, showed no evidence for recurrent disease. She has had no interim medical problems.  Medications: reviewed  Allergies:  Allergies  Allergen Reactions  . Lisinopril   . Simvastatin     Review of Systems: Constitutional:   She is running a low-grade fever today Respiratory: No cough Cardiovascular:  No chest pain or palpitations Gastrointestinal: No change in bowel habit Genito-Urinary: She denies any dysuria or frequency Musculoskeletal: No muscle bone or joint pain Neurologic: No headache or change in vision Skin: No rash Remaining ROS negative.  Physical Exam: Blood pressure 166/107, pulse 120, temperature 99.8 F (37.7 C), temperature source Oral, resp. rate 20, height 5' 6.5" (1.689 m), weight 206 lb 8 oz (93.668 kg). Wt Readings from Last 3 Encounters:  04/24/12 206 lb 8 oz (93.668 kg)  10/18/11 200 lb 12.8 oz (91.082 kg)  04/16/11 195 lb 9.6 oz (88.724 kg)     General appearance: Well-nourished Caucasian woman HENNT: Hair is thinning, throat no erythema or exudate Lymph nodes: No cervical, supraclavicular, axillary, or inguinal adenopathy Breasts: Lungs: Clear to auscultation resonant to percussion Heart: Regular rhythm no murmur Abdomen: Soft, nontender, no mass, no organomegaly Extremities: No edema, no calf tenderness Vascular: No  cyanosis Neurologic: Motor strength 5 over 5, reflexes absent symmetric at the knees, 1+ symmetric at the biceps Skin: No rash or ecchymosis  Lab Results: Lab Results  Component Value Date   WBC 6.4 04/10/2012   HGB 15.7 04/10/2012   HCT 44.9 04/10/2012   MCV 84.8 04/10/2012   PLT 254 04/10/2012     Chemistry      Component Value Date/Time   NA 140 04/10/2012 0824   NA 141 04/13/2011 0808   NA 140 11/02/2009 2004   K 3.9 04/10/2012 0824   K 3.4 04/13/2011 0808   K 3.2* 11/02/2009 2004   CL 103 04/10/2012 0824   CL 95* 04/13/2011 0808   CL 106 11/02/2009 2004   CO2 27 04/10/2012 0824   CO2 32 04/13/2011 0808   CO2 25 11/02/2009 2004   BUN 11.0 04/10/2012 0824   BUN 10 04/13/2011 0808   BUN 8 11/02/2009 2004   CREATININE 0.9 04/10/2012 0824   CREATININE 0.8 04/13/2011 0808   CREATININE 0.77 11/02/2009 2004      Component Value Date/Time   CALCIUM 9.6 04/10/2012 0824   CALCIUM 9.1 04/13/2011 0808   CALCIUM 9.3 11/02/2009 2004   ALKPHOS 99 04/10/2012 0824   ALKPHOS 80 04/13/2011 0808   ALKPHOS 71 10/09/2009 0735   AST 21 04/10/2012 0824   AST 28 04/13/2011 0808   AST 18 10/09/2009 0735   ALT 19 04/10/2012 0824   ALT 19 10/09/2009 0735   BILITOT 1.13 04/10/2012 0824   BILITOT 1.10 04/13/2011 0808   BILITOT 0.5 10/09/2009 0735    LDH 193 ESR 12 mm done 04/10/2012    Radiological Studies: See  discussion above Dg Chest 2 View  04/10/2012  *RADIOLOGY REPORT*  Clinical Data: Non-Hodgkins lymphoma.  CHEST - 2 VIEW  Comparison: 10/12/2011  Findings: Heart size is normal.  Both lungs are clear.  No evidence of pleural effusion.  No mass or lymphadenopathy identified.  No significant change is seen compared to prior exam.  IMPRESSION: Stable exam.  No active disease.   Original Report Authenticated By: Myles Rosenthal, M.D.    Ct Abdomen Pelvis W Contrast  04/10/2012  *RADIOLOGY REPORT*  Clinical Data: History of non-Hodgkins lymphoma.  Followup study.  CT ABDOMEN AND PELVIS WITH CONTRAST  Technique:   Multidetector CT imaging of the abdomen and pelvis was performed following the standard protocol during bolus administration of intravenous contrast.  Contrast: OMNIPAQUE IOHEXOL 300 MG/ML  SOLN  Comparison: CT of the abdomen and pelvis 10/12/2011.  Findings:  Lung Bases: Unremarkable.  Abdomen/Pelvis:  The appearance of the liver, gallbladder, pancreas, spleen, bilateral adrenal glands and bilateral kidneys is unremarkable.  Mild atherosclerosis throughout the abdominal and pelvic vasculature, without definite aneurysm or dissection.  No definite pathologically enlarged lymph nodes are identified within the abdomen or pelvis.  No significant volume of ascites.  No pneumoperitoneum.  No pathologic distension of small bowel.  The appendix is not confidently identified and is likely surgically absent.  Status post hysterectomy.  Right ovary is unremarkable in appearance.  The left ovary is not identified and may either be atrophic or surgically absent.  Urinary bladder is unremarkable in appearance.  Musculoskeletal: There are no aggressive appearing lytic or blastic lesions noted in the visualized portions of the skeleton.  IMPRESSION: 1.  No findings to suggest recurrent disease in the abdomen or pelvis on today's CT examination. 2.  Mild atherosclerosis. 3.  Postoperative changes, as above.   Original Report Authenticated By: Trudie Reed, M.D.    Mm Digital Screening  04/07/2012  *RADIOLOGY REPORT*  Clinical Data: Screening.  DIGITAL BILATERAL SCREENING MAMMOGRAM WITH CAD  Comparison:  Previous exams.  FINDINGS:  ACR Breast Density Category 2: There is a scattered fibroglandular pattern.  No suspicious masses, architectural distortion, or calcifications are present.  Images were processed with CAD.  IMPRESSION: No mammographic evidence of malignancy.  A result letter of this screening mammogram will be mailed directly to the patient.  RECOMMENDATION: Screening mammogram in one year. (Code:SM-B-01Y)   BI-RADS CATEGORY 1:  Negative.   Original Report Authenticated By: Elberta Fortis, M.D.     Impression and Plan: #1. Stage IB, extra nodal, high-grade , B-cell non-Hodgkin's lymphoma treated as outlined above. She remains in remission now out almost 3 years from diagnosis in April 2011.  I will discontinue routine CT scans at this time. We will continue to see her on an every six-month basis for clinical exam and lab. Annual chest x-ray.   CC:.    Levert Feinstein, MD 3/31/20147:30 PM

## 2012-04-29 ENCOUNTER — Encounter (HOSPITAL_COMMUNITY): Payer: Self-pay | Admitting: Emergency Medicine

## 2012-04-29 ENCOUNTER — Emergency Department (HOSPITAL_COMMUNITY): Payer: BC Managed Care – PPO

## 2012-04-29 ENCOUNTER — Emergency Department (HOSPITAL_COMMUNITY)
Admission: EM | Admit: 2012-04-29 | Discharge: 2012-04-29 | Disposition: A | Discharge status: Home / Self Care | Payer: BC Managed Care – PPO | Attending: Emergency Medicine | Admitting: Emergency Medicine

## 2012-04-29 DIAGNOSIS — R109 Unspecified abdominal pain: Secondary | ICD-10-CM

## 2012-04-29 DIAGNOSIS — Z79899 Other long term (current) drug therapy: Secondary | ICD-10-CM | POA: Insufficient documentation

## 2012-04-29 DIAGNOSIS — Z87898 Personal history of other specified conditions: Secondary | ICD-10-CM | POA: Insufficient documentation

## 2012-04-29 DIAGNOSIS — E876 Hypokalemia: Secondary | ICD-10-CM | POA: Insufficient documentation

## 2012-04-29 DIAGNOSIS — R110 Nausea: Secondary | ICD-10-CM | POA: Insufficient documentation

## 2012-04-29 DIAGNOSIS — F411 Generalized anxiety disorder: Secondary | ICD-10-CM | POA: Insufficient documentation

## 2012-04-29 DIAGNOSIS — Z7982 Long term (current) use of aspirin: Secondary | ICD-10-CM | POA: Insufficient documentation

## 2012-04-29 DIAGNOSIS — E119 Type 2 diabetes mellitus without complications: Secondary | ICD-10-CM | POA: Insufficient documentation

## 2012-04-29 DIAGNOSIS — R1011 Right upper quadrant pain: Secondary | ICD-10-CM | POA: Insufficient documentation

## 2012-04-29 DIAGNOSIS — I10 Essential (primary) hypertension: Secondary | ICD-10-CM | POA: Insufficient documentation

## 2012-04-29 LAB — CBC WITH DIFFERENTIAL/PLATELET
Basophils Absolute: 0 10*3/uL (ref 0.0–0.1)
Basophils Relative: 1 % (ref 0–1)
Eosinophils Absolute: 0.1 10*3/uL (ref 0.0–0.7)
Eosinophils Relative: 2 % (ref 0–5)
HCT: 40.2 % (ref 36.0–46.0)
Hemoglobin: 13.6 g/dL (ref 12.0–15.0)
Lymphocytes Relative: 31 % (ref 12–46)
Lymphs Abs: 2.3 10*3/uL (ref 0.7–4.0)
MCH: 28.5 pg (ref 26.0–34.0)
MCHC: 33.8 g/dL (ref 30.0–36.0)
MCV: 84.3 fL (ref 78.0–100.0)
Monocytes Absolute: 0.6 10*3/uL (ref 0.1–1.0)
Monocytes Relative: 8 % (ref 3–12)
Neutro Abs: 4.3 10*3/uL (ref 1.7–7.7)
Neutrophils Relative %: 58 % (ref 43–77)
Platelets: 228 10*3/uL (ref 150–400)
RBC: 4.77 MIL/uL (ref 3.87–5.11)
RDW: 12.9 % (ref 11.5–15.5)
WBC: 7.4 10*3/uL (ref 4.0–10.5)

## 2012-04-29 LAB — COMPREHENSIVE METABOLIC PANEL
ALT: 16 U/L (ref 0–35)
AST: 19 U/L (ref 0–37)
Albumin: 3.6 g/dL (ref 3.5–5.2)
Alkaline Phosphatase: 94 U/L (ref 39–117)
BUN: 10 mg/dL (ref 6–23)
CO2: 32 mEq/L (ref 19–32)
Calcium: 9 mg/dL (ref 8.4–10.5)
Chloride: 95 mEq/L — ABNORMAL LOW (ref 96–112)
Creatinine, Ser: 0.77 mg/dL (ref 0.50–1.10)
GFR calc Af Amer: >90 mL/min (ref >90)
GFR calc non Af Amer: 89 mL/min — ABNORMAL LOW (ref >90)
Glucose, Bld: 157 mg/dL — ABNORMAL HIGH (ref 70–99)
Potassium: 3.1 mEq/L — ABNORMAL LOW (ref 3.5–5.1)
Sodium: 135 mEq/L (ref 135–145)
Total Bilirubin: 1.1 mg/dL (ref 0.3–1.2)
Total Protein: 7.3 g/dL (ref 6.0–8.3)

## 2012-04-29 LAB — URINE MICROSCOPIC-ADD ON

## 2012-04-29 LAB — URINALYSIS, ROUTINE W REFLEX MICROSCOPIC
Bilirubin Urine: NEGATIVE
Glucose, UA: 100 mg/dL — AB
Hgb urine dipstick: NEGATIVE
Ketones, ur: NEGATIVE mg/dL
Nitrite: NEGATIVE
Protein, ur: NEGATIVE mg/dL
Specific Gravity, Urine: 1.031 — ABNORMAL HIGH (ref 1.005–1.030)
Urobilinogen, UA: 1 mg/dL (ref 0.0–1.0)
pH: 6 (ref 5.0–8.0)

## 2012-04-29 LAB — LACTIC ACID, PLASMA: Lactic Acid, Venous: 1.3 mmol/L (ref 0.5–2.2)

## 2012-04-29 LAB — LIPASE, BLOOD: Lipase: 40 U/L (ref 11–59)

## 2012-04-29 MED ORDER — METRONIDAZOLE IN NACL 5-0.79 MG/ML-% IV SOLN
500.0000 mg | Freq: Once | INTRAVENOUS | Status: AC
Start: 2012-04-29 — End: 2012-04-29
  Administered 2012-04-29: 500 mg via INTRAVENOUS
  Filled 2012-04-29: qty 100

## 2012-04-29 MED ORDER — ONDANSETRON HCL 4 MG PO TABS: 4.0000 mg | ORAL_TABLET | Freq: Four times a day (QID) | ORAL | Status: DC

## 2012-04-29 MED ORDER — CIPROFLOXACIN IN D5W 400 MG/200ML IV SOLN
400.0000 mg | Freq: Once | INTRAVENOUS | Status: AC
Start: 2012-04-29 — End: 2012-04-29
  Administered 2012-04-29: 400 mg via INTRAVENOUS
  Filled 2012-04-29: qty 200

## 2012-04-29 MED ORDER — METRONIDAZOLE 500 MG PO TABS: 500.0000 mg | ORAL_TABLET | Freq: Three times a day (TID) | ORAL | Status: DC

## 2012-04-29 MED ORDER — POTASSIUM CHLORIDE 10 MEQ/100ML IV SOLN
10.0000 meq | Freq: Once | INTRAVENOUS | Status: AC
  Administered 2012-04-29: 10 meq via INTRAVENOUS
  Filled 2012-04-29: qty 100

## 2012-04-29 MED ORDER — CIPROFLOXACIN HCL 500 MG PO TABS: 500.0000 mg | ORAL_TABLET | Freq: Two times a day (BID) | ORAL | Status: DC

## 2012-04-29 MED ORDER — ONDANSETRON HCL 4 MG/2ML IJ SOLN
4.0000 mg | Freq: Once | INTRAMUSCULAR | Status: AC
  Administered 2012-04-29: 4 mg via INTRAVENOUS
  Filled 2012-04-29: qty 2

## 2012-04-29 MED ORDER — HYDROCODONE-ACETAMINOPHEN 5-325 MG PO TABS: 2.0000 | ORAL_TABLET | ORAL | Status: DC | PRN

## 2012-04-29 MED ORDER — HYDROMORPHONE HCL PF 1 MG/ML IJ SOLN
1.0000 mg | Freq: Once | INTRAMUSCULAR | Status: AC
  Administered 2012-04-29: 1 mg via INTRAVENOUS
  Filled 2012-04-29: qty 1

## 2012-04-29 MED ORDER — SODIUM CHLORIDE 0.9 % IV SOLN
INTRAVENOUS | Status: DC
  Administered 2012-04-29: 05:00:00 via INTRAVENOUS

## 2012-04-29 NOTE — ED Provider Notes (Signed)
History     CSN: [id number]  Arrival date & time 04/29/12  0220   First MD Initiated Contact with Patient 04/29/12 [phone]      Chief Complaint  Patient presents with  . Abdominal Pain    (Consider location/radiation/quality/duration/timing/severity/associated sxs/prior treatment) HPI History provided by patient. Right upper quadrant abdominal pain x4 days has been waxing and waning but seems worse tonight. Symptoms not associated with eating. No fevers or chills. No associated nausea vomiting or diarrhea. Patient has remote history of non-Hodgkin's lymphoma and had CT scans ordered by her oncologist 2 weeks ago reported as normal. No constipation. No blood in stools. No history of gallbladder problems. No history of similar symptoms. No hematuria. Pain is sharp in quality and not radiating. Past Medical History  Diagnosis Date  . Diabetes mellitus   . Hypertension   . Anxiety 02/08/2011  . nhl dx'd 04/2009    chemo comp 08/2009    Past Surgical History  Procedure Laterality Date  . Abdominal hysterectomy    . Colectomy      No family history on file.  History  Substance Use Topics  . Smoking status: Never Smoker   . Smokeless tobacco: Not on file  . Alcohol Use: No    OB History   Grav Para Term Preterm Abortions TAB SAB Ect Mult Living                  Review of Systems  Constitutional: Negative for fever and chills.  HENT: Negative for neck pain and neck stiffness.   Eyes: Negative for pain.  Respiratory: Negative for shortness of breath.   Cardiovascular: Negative for chest pain.  Gastrointestinal: Positive for abdominal pain. Negative for vomiting, diarrhea, constipation and blood in stool.  Genitourinary: Negative for dysuria.  Musculoskeletal: Negative for back pain.  Skin: Negative for rash.  Neurological: Negative for headaches.  All other systems reviewed and are negative.    Allergies  Simvastatin and Lisinopril  Home Medications   Current  Outpatient Rx  Name  Route  Sig  Dispense  Refill  . aspirin 81 MG tablet   Oral   Take 81 mg by mouth every evening.          Marland Kitchen aspirin-acetaminophen-caffeine (EXCEDRIN MIGRAINE) 250-250-65 MG per tablet   Oral   Take 1 tablet by mouth every 6 (six) hours as needed for pain (for headache).         . hydrochlorothiazide (HYDRODIURIL) 25 MG tablet   Oral   Take 25 mg by mouth every morning.          Marland Kitchen LORazepam (ATIVAN) 1 MG tablet   Oral   Take 0.5 mg by mouth at bedtime as needed (for sleep).          . zolpidem (AMBIEN) 10 MG tablet   Oral   Take 5-10 mg by mouth at bedtime as needed for sleep.            BP 125/73  Pulse 94  Temp(Src) 98.9 F (37.2 C) (Oral)  Resp 18  Wt 206 lb (93.441 kg)  BMI 32.76 kg/m2  SpO2 99%  Physical Exam  Constitutional: She is oriented to person, place, and time. She appears well-developed and well-nourished.  HENT:  Head: Normocephalic and atraumatic.  Mouth/Throat: Oropharynx is clear and moist.  Eyes: EOM are normal. Pupils are equal, round, and reactive to light.  Neck: Neck supple.  Cardiovascular: Normal rate, regular rhythm and intact distal pulses.  Pulmonary/Chest: Effort normal and breath sounds normal. No respiratory distress.  Abdominal: Soft. Bowel sounds are normal. She exhibits no distension and no mass.  Tender right upper quadrant. Negative CVA tenderness  Musculoskeletal: Normal range of motion. She exhibits no edema.  Neurological: She is alert and oriented to person, place, and time.  Skin: Skin is warm and dry.    ED Course  Procedures (including critical care time)  Results for orders placed during the hospital encounter of 04/29/12  CBC WITH DIFFERENTIAL      Result Value Range   WBC 7.4  4.0 - 10.5 K/uL   RBC 4.77  3.87 - 5.11 MIL/uL   Hemoglobin 13.6  12.0 - 15.0 g/dL   HCT 47.8  29.5 - 62.1 %   MCV 84.3  78.0 - 100.0 fL   MCH 28.5  26.0 - 34.0 pg   MCHC 33.8  30.0 - 36.0 g/dL   RDW 30.8   65.7 - 84.6 %   Platelets 228  150 - 400 K/uL   Neutrophils Relative 58  43 - 77 %   Neutro Abs 4.3  1.7 - 7.7 K/uL   Lymphocytes Relative 31  12 - 46 %   Lymphs Abs 2.3  0.7 - 4.0 K/uL   Monocytes Relative 8  3 - 12 %   Monocytes Absolute 0.6  0.1 - 1.0 K/uL   Eosinophils Relative 2  0 - 5 %   Eosinophils Absolute 0.1  0.0 - 0.7 K/uL   Basophils Relative 1  0 - 1 %   Basophils Absolute 0.0  0.0 - 0.1 K/uL  COMPREHENSIVE METABOLIC PANEL      Result Value Range   Sodium 135  135 - 145 mEq/L   Potassium 3.1 (*) 3.5 - 5.1 mEq/L   Chloride 95 (*) 96 - 112 mEq/L   CO2 32  19 - 32 mEq/L   Glucose, Bld 157 (*) 70 - 99 mg/dL   BUN 10  6 - 23 mg/dL   Creatinine, Ser 9.62  0.50 - 1.10 mg/dL   Calcium 9.0  8.4 - 95.2 mg/dL   Total Protein 7.3  6.0 - 8.3 g/dL   Albumin 3.6  3.5 - 5.2 g/dL   AST 19  0 - 37 U/L   ALT 16  0 - 35 U/L   Alkaline Phosphatase 94  39 - 117 U/L   Total Bilirubin 1.1  0.3 - 1.2 mg/dL   GFR calc non Af Amer 89 (*) >90 mL/min   GFR calc Af Amer >90  >90 mL/min  LIPASE, BLOOD      Result Value Range   Lipase 40  11 - 59 U/L  URINALYSIS, ROUTINE W REFLEX MICROSCOPIC      Result Value Range   Color, Urine AMBER (*) YELLOW   APPearance CLOUDY (*) CLEAR   Specific Gravity, Urine 1.031 (*) 1.005 - 1.030   pH 6.0  5.0 - 8.0   Glucose, UA 100 (*) NEGATIVE mg/dL   Hgb urine dipstick NEGATIVE  NEGATIVE   Bilirubin Urine NEGATIVE  NEGATIVE   Ketones, ur NEGATIVE  NEGATIVE mg/dL   Protein, ur NEGATIVE  NEGATIVE mg/dL   Urobilinogen, UA 1.0  0.0 - 1.0 mg/dL   Nitrite NEGATIVE  NEGATIVE   Leukocytes, UA MODERATE (*) NEGATIVE  URINE MICROSCOPIC-ADD ON      Result Value Range   Squamous Epithelial / LPF RARE  RARE   WBC, UA 3-6  <3 WBC/hpf   Bacteria,  UA FEW (*) RARE   Crystals URIC ACID CRYSTALS (*) NEGATIVE   Urine-Other MUCOUS PRESENT    LACTIC ACID, PLASMA      Result Value Range   Lactic Acid, Venous 1.3  0.5 - 2.2 mmol/L   Ct Abdomen Pelvis Wo  Contrast  04/29/2012  *RADIOLOGY REPORT*  Clinical Data: Right upper abdominal pain.  CT ABDOMEN AND PELVIS WITHOUT CONTRAST  Technique:  Multidetector CT imaging of the abdomen and pelvis was performed following the standard protocol without intravenous contrast.  Comparison: 04/10/2012  Findings: Some increase in coarse subpleural linear scarring or subsegmental atelectasis posteriorly in the visualized lung bases. Unremarkable uninfused evaluation of liver, gallbladder, spleen, adrenal glands, kidneys, pancreas.  Scattered aortoiliac arterial plaque without aneurysm.  Stomach, small bowel, and colon are nondilated. Previous right hemicolectomy.  There are mild inflammatory/edematous changes in the pericolonic fat near the anastomosis. No definite wall thickening.  No free air or extraluminal fluid.  Urinary bladder is nondistended.  No ascites. No free air.  No adenopathy localized. Spondylitic changes throughout lumbar spine.  IMPRESSION:  New nonspecific inflammatory/edematous changes adjacent to the ileocolonic anastomosis without evidence of obstruction, perforation or abscess.   Original Report Authenticated By: D. Andria Rhein, MD    Dg Chest 2 View  04/10/2012  *RADIOLOGY REPORT*  Clinical Data: Non-Hodgkins lymphoma.  CHEST - 2 VIEW  Comparison: 10/12/2011  Findings: Heart size is normal.  Both lungs are clear.  No evidence of pleural effusion.  No mass or lymphadenopathy identified.  No significant change is seen compared to prior exam.  IMPRESSION: Stable exam.  No active disease.   Original Report Authenticated By: Myles Rosenthal, M.D.    US Abdomen Complete  04/29/2012  *RADIOLOGY REPORT*  Clinical Data:  Right-sided pain.  COMPLETE ABDOMINAL ULTRASOUND  Comparison:  CT abdomen and pelvis 04/10/2012.  Findings:  Gallbladder:  No gallstones, gallbladder wall thickening, or pericholecystic fluid.  Common bile duct:  Normal caliber.  Diameter measured at 4.6 mm.  Liver:  No focal lesion identified.   Within normal limits in parenchymal echogenicity.  IVC:  Appears normal.  Pancreas:  Pancreas is poorly obscured by overlying bowel gas.  Spleen:  Spleen length measures 10.7 cm.  Normal parenchymal echotexture.  Right Kidney:  Right kidney measures 12.4 cm length.  No hydronephrosis.  Left Kidney:  Left kidney measures 12 cm length.  No hydronephrosis.  Abdominal aorta:  No aneurysm identified.  IMPRESSION: Negative abdominal ultrasound.   Original Report Authenticated By: Burman Nieves, M.D.      Potassium provided for hypokalemia. She declines repeat CT scan at this time, will check ultrasound to evaluate gallbladder   MDM  Right-sided abdominal pain with nausea, evaluated with labs and ultrasound reviewed as above. Pain controlled with IV narcotics. Percent on reviewing and CT scan ordered to further evaluate. Dr. Manus Gunning to follow CT results        Sunnie Nielsen, MD 04/29/12 [phone]

## 2012-04-29 NOTE — ED Notes (Signed)
Pt c/o RUQ pain onset Thursday, worse tonight. Pt denies n/v/d. Pt appears very pale.

## 2012-04-29 NOTE — ED Notes (Signed)
Patient transported to CT 

## 2012-04-29 NOTE — ED Provider Notes (Signed)
I assumed care of patient from Dr. Dierdre Highman at 7:30 AM. Patient is awaiting abdominal CT to evaluate her right upper quadrant pain. Ultrasound was normal.  CT scan shows New nonspecific inflammatory/edematous changes adjacent to the ileocolonic anastomosis without evidence of obstruction,perforation or abscess.  On reevaluation, patient is comfortable. She has mild right-sided abdominal tenderness in the upper and lower quadrants. No peritoneal signs. Will start cipro, flagyl.  Patient instructed to followup with Dr. Barbaraann Barthel or Dr. Dulce Sellar this week. Tolerating PO. Return precautions discussed.  BP 118/68  Pulse 94  Temp(Src) 98.6 F (37 C) (Oral)  Resp 16  Wt 206 lb (93.441 kg)  BMI 32.76 kg/m2  SpO2 96%    Hannah Octave, MD 04/29/12 1027

## 2012-04-30 LAB — URINE CULTURE: Colony Count: 90000

## 2012-05-03 ENCOUNTER — Encounter: Payer: Self-pay | Admitting: Oncology

## 2012-06-01 ENCOUNTER — Other Ambulatory Visit: Payer: Self-pay | Admitting: Gastroenterology

## 2012-10-20 ENCOUNTER — Other Ambulatory Visit (HOSPITAL_BASED_OUTPATIENT_CLINIC_OR_DEPARTMENT_OTHER): Payer: BC Managed Care – PPO

## 2012-10-20 DIAGNOSIS — C8589 Other specified types of non-Hodgkin lymphoma, extranodal and solid organ sites: Secondary | ICD-10-CM

## 2012-10-20 DIAGNOSIS — C8590 Non-Hodgkin lymphoma, unspecified, unspecified site: Secondary | ICD-10-CM

## 2012-10-20 LAB — COMPREHENSIVE METABOLIC PANEL (CC13)
ALT: 18 U/L (ref 0–55)
AST: 21 U/L (ref 5–34)
Albumin: 3.7 g/dL (ref 3.5–5.0)
Alkaline Phosphatase: 88 U/L (ref 40–150)
BUN: 8.8 mg/dL (ref 7.0–26.0)
CO2: 29 mEq/L (ref 22–29)
Calcium: 9.6 mg/dL (ref 8.4–10.4)
Chloride: 103 mEq/L (ref 98–109)
Creatinine: 0.8 mg/dL (ref 0.6–1.1)
Glucose: 124 mg/dl (ref 70–140)
Potassium: 3.9 mEq/L (ref 3.5–5.1)
Sodium: 143 mEq/L (ref 136–145)
Total Bilirubin: 1.16 mg/dL (ref 0.20–1.20)
Total Protein: 7.6 g/dL (ref 6.4–8.3)

## 2012-10-20 LAB — CBC & DIFF AND RETIC
BASO%: 1.1 % (ref 0.0–2.0)
Basophils Absolute: 0.1 10*3/uL (ref 0.0–0.1)
EOS%: 3.7 % (ref 0.0–7.0)
Eosinophils Absolute: 0.2 10*3/uL (ref 0.0–0.5)
HCT: 42.6 % (ref 34.8–46.6)
HGB: 14.5 g/dL (ref 11.6–15.9)
Immature Retic Fract: 3.3 % (ref 1.60–10.00)
LYMPH%: 40.4 % (ref 14.0–49.7)
MCH: 29.1 pg (ref 25.1–34.0)
MCHC: 34 g/dL (ref 31.5–36.0)
MCV: 85.5 fL (ref 79.5–101.0)
MONO#: 0.4 10*3/uL (ref 0.1–0.9)
MONO%: 6.7 % (ref 0.0–14.0)
NEUT#: 2.7 10*3/uL (ref 1.5–6.5)
NEUT%: 48.1 % (ref 38.4–76.8)
Platelets: 246 10*3/uL (ref 145–400)
RBC: 4.98 10*6/uL (ref 3.70–5.45)
RDW: 12.8 % (ref 11.2–14.5)
Retic %: 1.17 % (ref 0.70–2.10)
Retic Ct Abs: 58.27 10*3/uL (ref 33.70–90.70)
WBC: 5.7 10*3/uL (ref 3.9–10.3)
lymph#: 2.3 10*3/uL (ref 0.9–3.3)

## 2012-10-20 LAB — LACTATE DEHYDROGENASE (CC13): LDH: 172 U/L (ref 125–245)

## 2012-10-20 LAB — SEDIMENTATION RATE: Sed Rate: 12 mm/hr (ref 0–22)

## 2012-10-23 ENCOUNTER — Telehealth: Payer: Self-pay | Admitting: Oncology

## 2012-10-23 ENCOUNTER — Ambulatory Visit (HOSPITAL_BASED_OUTPATIENT_CLINIC_OR_DEPARTMENT_OTHER): Payer: BC Managed Care – PPO | Admitting: Nurse Practitioner

## 2012-10-23 VITALS — BP 147/89 | HR 84 | Temp 98.1°F | Resp 20 | Ht 66.5 in | Wt 202.0 lb

## 2012-10-23 DIAGNOSIS — F411 Generalized anxiety disorder: Secondary | ICD-10-CM

## 2012-10-23 DIAGNOSIS — E785 Hyperlipidemia, unspecified: Secondary | ICD-10-CM

## 2012-10-23 DIAGNOSIS — C8590 Non-Hodgkin lymphoma, unspecified, unspecified site: Secondary | ICD-10-CM

## 2012-10-23 DIAGNOSIS — I10 Essential (primary) hypertension: Secondary | ICD-10-CM

## 2012-10-23 DIAGNOSIS — R109 Unspecified abdominal pain: Secondary | ICD-10-CM

## 2012-10-23 DIAGNOSIS — C8589 Other specified types of non-Hodgkin lymphoma, extranodal and solid organ sites: Secondary | ICD-10-CM

## 2012-10-23 NOTE — Progress Notes (Signed)
OFFICE PROGRESS NOTE  Interval history:   Ms. Hannah Frazier is a 62 year old woman diagnosed with stage IB extranodal high-grade B-cell non-Hodgkin's lymphoma in April 2011. She initially presented with abdominal pain and anemia. She was found to have a large soft tissue mass in the terminal ileum. She underwent surgical resection on 05/14/2009 followed by 6 cycles of CHOP/Rituxan chemotherapy between 06/06/2009 and 09/22/2009. Last scheduled CT scans 04/10/2012 showed no evidence for recurrent disease.  She presented to the emergency Department on 04/29/2012 with right-sided abdominal pain. Non-contrast CT scan of the abdomen and pelvis showed new nonspecific inflammatory/edematous changes adjacent to the ileocolonic anastomosis without evidence of obstruction, perforation or abscess. She reports subsequently undergoing a colonoscopy which was unremarkable (we do not have that report).  She continues to have right-sided abdominal pain which she feels goes through to the back. The pain is there fairly consistently. The intensity of the pain is less as compared to April of this year. The pain is not sharp. The pain is exacerbated by laying on the right side. She has taken no pain medication. She is concerned stating she had abdominal pain for one year prior to the lymphoma diagnosis. She denies any nausea or vomiting. Bowels moving regularly. No bloody or black stools. She has a good appetite. No weight loss. No nausea or vomiting. She denies shortness of breath and cough. No urinary symptoms. No skin rash.   Objective: Blood pressure 147/89, pulse 84, temperature 98.1 F (36.7 C), temperature source Oral, resp. rate 20, height 5' 6.5" (1.689 m), weight 202 lb (91.627 kg).  Oropharynx is without thrush or ulceration. No palpable cervical, supraclavicular, axillary or inguinal lymph nodes. Lungs are clear. No wheezes or rales. Regular cardiac rhythm. Abdomen soft and nontender. No organomegaly. No mass.  Extremities without edema. Motor strength 5 over 5. Knee DTRs 1+, symmetric. No skin rash. She is alert and oriented. Gait is normal.  Lab Results: Lab Results  Component Value Date   WBC 5.7 10/20/2012   HGB 14.5 10/20/2012   HCT 42.6 10/20/2012   MCV 85.5 10/20/2012   PLT 246 10/20/2012    Chemistry:    Chemistry      Component Value Date/Time   NA 143 10/20/2012 0801   NA 135 04/29/2012 0229   NA 141 04/13/2011 0808   K 3.9 10/20/2012 0801   K 3.1* 04/29/2012 0229   K 3.4 04/13/2011 0808   CL 95* 04/29/2012 0229   CL 103 04/10/2012 0824   CL 95* 04/13/2011 0808   CO2 29 10/20/2012 0801   CO2 32 04/29/2012 0229   CO2 32 04/13/2011 0808   BUN 8.8 10/20/2012 0801   BUN 10 04/29/2012 0229   BUN 10 04/13/2011 0808   CREATININE 0.8 10/20/2012 0801   CREATININE 0.77 04/29/2012 0229   CREATININE 0.8 04/13/2011 0808      Component Value Date/Time   CALCIUM 9.6 10/20/2012 0801   CALCIUM 9.0 04/29/2012 0229   CALCIUM 9.1 04/13/2011 0808   ALKPHOS 88 10/20/2012 0801   ALKPHOS 94 04/29/2012 0229   ALKPHOS 80 04/13/2011 0808   AST 21 10/20/2012 0801   AST 19 04/29/2012 0229   AST 28 04/13/2011 0808   ALT 18 10/20/2012 0801   ALT 16 04/29/2012 0229   ALT 25 04/13/2011 0808   BILITOT 1.16 10/20/2012 0801   BILITOT 1.1 04/29/2012 0229   BILITOT 1.10 04/13/2011 0808       Studies/Results: No results found.  Medications: I have reviewed the  patient's current medications.  Assessment/Plan:  1. Non-Hodgkin's lymphoma, stage IB, extranodal diagnosed April 2011 presenting with abdominal pain and anemia. She was found to have a large soft tissue mass in the terminal ileum. She underwent surgical resection on 05/14/2009 followed by 6 cycles of CHOP/Rituxan chemotherapy between 06/06/2009 and 09/22/2009. 2. Essential hypertension. 3. Hyperlipidemia.  4. Chronic anxiety and depression. 5. 6 month history of right upper abdominal/back pain.  Disposition-Ms. Hannah Frazier appears stable. She is seen today for scheduled followup  and reports a 6 month history of right upper abdominal/back pain. We are referring her for CT scans of the abdomen and pelvis to rule out recurrent lymphoma. We will contact her once that result is available.  She will return for a followup visit in 6 months. We will adjust that appointment accordingly pending the CT result.  Plan reviewed with Dr. Cyndie Chime.  Lonna Cobb ANP/GNP-BC

## 2012-10-23 NOTE — Telephone Encounter (Signed)
Gave referral to [facility] for CT, gave pt appt for lab before MD on March 2015

## 2012-10-24 ENCOUNTER — Ambulatory Visit (HOSPITAL_COMMUNITY)
Admission: RE | Admit: 2012-10-24 | Discharge: 2012-10-24 | Disposition: A | Discharge status: Home / Self Care | Payer: BC Managed Care – PPO | Source: Ambulatory Visit | Attending: Nurse Practitioner | Admitting: Nurse Practitioner

## 2012-10-24 ENCOUNTER — Encounter (HOSPITAL_COMMUNITY): Payer: Self-pay

## 2012-10-24 DIAGNOSIS — C8590 Non-Hodgkin lymphoma, unspecified, unspecified site: Secondary | ICD-10-CM

## 2012-10-24 DIAGNOSIS — Z87898 Personal history of other specified conditions: Secondary | ICD-10-CM | POA: Insufficient documentation

## 2012-10-24 DIAGNOSIS — R1011 Right upper quadrant pain: Secondary | ICD-10-CM | POA: Insufficient documentation

## 2012-10-24 DIAGNOSIS — R109 Unspecified abdominal pain: Secondary | ICD-10-CM

## 2012-10-24 MED ORDER — IOHEXOL 300 MG/ML  SOLN
100.0000 mL | Freq: Once | INTRAMUSCULAR | Status: AC | PRN
  Administered 2012-10-24: 100 mL via INTRAVENOUS

## 2012-10-25 ENCOUNTER — Telehealth: Payer: Self-pay | Admitting: Nurse Practitioner

## 2012-10-25 NOTE — Telephone Encounter (Signed)
I notified Ms. Hannah Frazier the recent CT scan showed no evidence of recurrent lymphoma/no explanation for the pain she is experiencing. She plans to follow up with her primary care provider.

## 2012-11-30 ENCOUNTER — Other Ambulatory Visit: Payer: Self-pay

## 2013-03-24 ENCOUNTER — Encounter: Payer: Self-pay | Admitting: Oncology

## 2013-04-18 ENCOUNTER — Other Ambulatory Visit (HOSPITAL_BASED_OUTPATIENT_CLINIC_OR_DEPARTMENT_OTHER): Payer: No Typology Code available for payment source

## 2013-04-18 DIAGNOSIS — C8589 Other specified types of non-Hodgkin lymphoma, extranodal and solid organ sites: Secondary | ICD-10-CM

## 2013-04-18 DIAGNOSIS — I10 Essential (primary) hypertension: Secondary | ICD-10-CM

## 2013-04-18 DIAGNOSIS — C8590 Non-Hodgkin lymphoma, unspecified, unspecified site: Secondary | ICD-10-CM

## 2013-04-18 LAB — CBC WITH DIFFERENTIAL/PLATELET
BASO%: 0.9 % (ref 0.0–2.0)
Basophils Absolute: 0.1 10*3/uL (ref 0.0–0.1)
EOS%: 4.3 % (ref 0.0–7.0)
Eosinophils Absolute: 0.3 10*3/uL (ref 0.0–0.5)
HCT: 41.8 % (ref 34.8–46.6)
HGB: 14.2 g/dL (ref 11.6–15.9)
LYMPH%: 41.2 % (ref 14.0–49.7)
MCH: 29 pg (ref 25.1–34.0)
MCHC: 34.1 g/dL (ref 31.5–36.0)
MCV: 85.3 fL (ref 79.5–101.0)
MONO#: 0.5 10*3/uL (ref 0.1–0.9)
MONO%: 6.8 % (ref 0.0–14.0)
NEUT#: 3.1 10*3/uL (ref 1.5–6.5)
NEUT%: 46.8 % (ref 38.4–76.8)
Platelets: 237 10*3/uL (ref 145–400)
RBC: 4.9 10*6/uL (ref 3.70–5.45)
RDW: 13.6 % (ref 11.2–14.5)
WBC: 6.6 10*3/uL (ref 3.9–10.3)
lymph#: 2.7 10*3/uL (ref 0.9–3.3)

## 2013-04-18 LAB — COMPREHENSIVE METABOLIC PANEL (CC13)
ALT: 19 U/L (ref 0–55)
AST: 20 U/L (ref 5–34)
Albumin: 3.8 g/dL (ref 3.5–5.0)
Alkaline Phosphatase: 87 U/L (ref 40–150)
Anion Gap: 13 mEq/L — ABNORMAL HIGH (ref 3–11)
BUN: 9.5 mg/dL (ref 7.0–26.0)
CO2: 26 mEq/L (ref 22–29)
Calcium: 9.5 mg/dL (ref 8.4–10.4)
Chloride: 103 mEq/L (ref 98–109)
Creatinine: 0.8 mg/dL (ref 0.6–1.1)
Glucose: 141 mg/dl — ABNORMAL HIGH (ref 70–140)
Potassium: 3.7 mEq/L (ref 3.5–5.1)
Sodium: 142 mEq/L (ref 136–145)
Total Bilirubin: 0.82 mg/dL (ref 0.20–1.20)
Total Protein: 7.8 g/dL (ref 6.4–8.3)

## 2013-04-18 LAB — LACTATE DEHYDROGENASE (CC13): LDH: 200 U/L (ref 125–245)

## 2013-04-22 ENCOUNTER — Other Ambulatory Visit: Payer: Self-pay | Admitting: Hematology and Oncology

## 2013-04-22 DIAGNOSIS — C8590 Non-Hodgkin lymphoma, unspecified, unspecified site: Secondary | ICD-10-CM

## 2013-04-23 ENCOUNTER — Ambulatory Visit (HOSPITAL_BASED_OUTPATIENT_CLINIC_OR_DEPARTMENT_OTHER): Payer: No Typology Code available for payment source | Admitting: Hematology and Oncology

## 2013-04-23 ENCOUNTER — Ambulatory Visit: Payer: BC Managed Care – PPO | Admitting: Oncology

## 2013-04-23 ENCOUNTER — Encounter: Payer: Self-pay | Admitting: Hematology and Oncology

## 2013-04-23 VITALS — BP 139/81 | HR 79 | Temp 98.7°F | Resp 20 | Ht 66.5 in | Wt 203.4 lb

## 2013-04-23 DIAGNOSIS — C8590 Non-Hodgkin lymphoma, unspecified, unspecified site: Secondary | ICD-10-CM

## 2013-04-23 DIAGNOSIS — C8589 Other specified types of non-Hodgkin lymphoma, extranodal and solid organ sites: Secondary | ICD-10-CM

## 2013-04-23 DIAGNOSIS — R1011 Right upper quadrant pain: Secondary | ICD-10-CM

## 2013-04-23 NOTE — Progress Notes (Signed)
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   05/14/2009 Surgery The patient underwent a terminal ileum resection and ascending colon resection which confirmed diffuse large B-cell lymphoma.   06/06/2009 - 09/22/2009 Chemotherapy The patient completed 6 cycles of R. CHOP chemotherapy.   06/12/2009 Imaging PET CT scan show no evidence of residual disease.   10/16/2009 Imaging PET CT scan show no evidence of residual disease.   06/01/2012 Procedure Colonoscopy showed no evidence of recurrence of disease.   10/24/2012 Imaging CT scan show no evidence of recurrence of disease.    INTERVAL HISTORY: Hannah Frazier 63 y.o. female returns for further followup. I reviewed her records extensively and collaborated the history with the patient and summarized the history as above. She still has intermittent right flank pain. She denies any bloating sensation, nausea or change in bowel habits. Denies any melena or hematochezia. Her appetite is stable, denies any recent weight loss. No recent infection.   I  have reviewed the past medical history, past surgical history, social history and family history with the patient and they are unchanged from previous note.  ALLERGIES:   is allergic to simvastatin and lisinopril.  MEDICATIONS:  Current Outpatient Prescriptions  Medication Sig Dispense Refill  . aspirin 81 MG tablet Take 81 mg by mouth every evening.       Marland Kitchen aspirin-acetaminophen-caffeine (EXCEDRIN MIGRAINE) 250-250-65 MG per tablet Take 1 tablet by mouth every 6 (six) hours as needed for pain (for headache).      . hydrochlorothiazide (HYDRODIURIL) 25 MG tablet Take 25 mg by mouth every morning.       Marland Kitchen LORazepam (ATIVAN) 1 MG tablet Take 0.5 mg by mouth at bedtime as needed (for sleep).       . zolpidem (AMBIEN) 10 MG tablet Take 5-10 mg by mouth at bedtime as needed for sleep.       . [DISCONTINUED] FLUoxetine (PROZAC) 20 MG tablet Take 20 mg by mouth daily.      . [DISCONTINUED] potassium chloride SA (K-DUR,KLOR-CON) 20 MEQ tablet Take 1 tablet (20 mEq total) by mouth 2 (two) times daily.  60 tablet  0   No current facility-administered medications for this visit.    REVIEW OF SYSTEMS:   Constitutional: Denies fevers, chills or abnormal weight loss Eyes: Denies blurriness of vision Ears, nose, mouth, throat, and face: Denies mucositis or sore throat Respiratory: Denies cough, dyspnea or wheezes Cardiovascular: Denies palpitation, chest discomfort or lower extremity swelling Skin: Denies abnormal skin rashes Lymphatics: Denies new lymphadenopathy or easy bruising Neurological:Denies numbness, tingling or new weaknesses Behavioral/Psych: Mood is stable, no new changes  All other systems were reviewed with the patient and are negative.  PHYSICAL EXAMINATION: ECOG PERFORMANCE STATUS: 1 - Symptomatic  but completely ambulatory  Filed Vitals:   04/23/13 1321  BP: 139/81  Pulse: 79  Temp: 98.7 F (37.1 C)  Resp: 20   Filed Weights   04/23/13 1321  Weight: 203 lb 6.4 oz (92.262 kg)    GENERAL:alert, no distress and comfortable. She is morbidly obese SKIN: skin color, texture, turgor are normal, no rashes or significant lesions EYES: normal,  Conjunctiva are pink and non-injected, sclera clear OROPHARYNX:no exudate, no erythema and lips, buccal mucosa, and tongue normal  NECK: supple, thyroid normal size, non-tender, without nodularity LYMPH:  no palpable lymphadenopathy in the cervical, axillary or inguinal LUNGS: clear to auscultation and percussion with normal breathing effort HEART: regular rate & rhythm and no murmurs and no lower extremity edema ABDOMEN:abdomen soft, tenderness on palpation on the right upper quadrant with some mild guarding but no rebound. Well-healed surgical scar Musculoskeletal:no cyanosis of digits and no clubbing  NEURO: alert & oriented x 3 with fluent speech, no focal motor/sensory deficits  LABORATORY DATA:  I have reviewed the data as listed    Component Value Date/Time   NA 142 04/18/2013 0818   NA 135 04/29/2012 0229   NA 141 04/13/2011 0808   K 3.7 04/18/2013 0818   K 3.1* 04/29/2012 0229   K 3.4 04/13/2011 0808   CL 95* 04/29/2012 0229   CL 103 04/10/2012 0824   CL 95* 04/13/2011 0808   CO2 26 04/18/2013 0818   CO2 32 04/29/2012 0229   CO2 32 04/13/2011 0808   GLUCOSE 141* 04/18/2013 0818   GLUCOSE 157* 04/29/2012 0229   GLUCOSE 111* 04/10/2012 0824   GLUCOSE 104 04/13/2011 0808   BUN 9.5 04/18/2013 0818   BUN 10 04/29/2012 0229   BUN 10 04/13/2011 0808   CREATININE 0.8 04/18/2013 0818   CREATININE 0.77 04/29/2012 0229   CREATININE 0.8 04/13/2011 0808   CALCIUM 9.5 04/18/2013 0818   CALCIUM 9.0 04/29/2012 0229   CALCIUM 9.1 04/13/2011 0808   PROT 7.8 04/18/2013 0818   PROT 7.3 04/29/2012 0229   PROT 7.5 04/13/2011 0808   ALBUMIN 3.8 04/18/2013 0818   ALBUMIN 3.6 04/29/2012 0229   AST 20 04/18/2013 0818   AST 19 04/29/2012 0229   AST 28 04/13/2011 0808   ALT 19 04/18/2013 0818   ALT 16 04/29/2012 0229   ALT 25 04/13/2011 0808   ALKPHOS 87 04/18/2013 0818   ALKPHOS 94 04/29/2012 0229   ALKPHOS 80 04/13/2011 0808   BILITOT 0.82 04/18/2013 0818   BILITOT 1.1 04/29/2012 0229   BILITOT 1.10 04/13/2011 0808   GFRNONAA 89*  04/29/2012 0229   GFRAA >90 04/29/2012 0229    No results found for this basename: SPEP, UPEP,  kappa and lambda light chains    Lab Results  Component Value Date   WBC 6.6 04/18/2013   NEUTROABS 3.1 04/18/2013   HGB 14.2 04/18/2013   HCT 41.8 04/18/2013   MCV 85.3 04/18/2013   PLT 237 04/18/2013      Chemistry      Component Value Date/Time   NA 142 04/18/2013 0818   NA 135 04/29/2012 0229   NA 141 04/13/2011 0808   K 3.7 04/18/2013 0818   K 3.1* 04/29/2012 0229   K 3.4 04/13/2011 0808   CL 95* 04/29/2012 0229   CL 103 04/10/2012 0824   CL 95* 04/13/2011 0808   CO2 26 04/18/2013 0818   CO2 32 04/29/2012 0229   CO2 32 04/13/2011 0808   BUN 9.5 04/18/2013 0818  BUN 10 04/29/2012 0229   BUN 10 04/13/2011 0808   CREATININE 0.8 04/18/2013 0818   CREATININE 0.77 04/29/2012 0229   CREATININE 0.8 04/13/2011 0808      Component Value Date/Time   CALCIUM 9.5 04/18/2013 0818   CALCIUM 9.0 04/29/2012 0229   CALCIUM 9.1 04/13/2011 0808   ALKPHOS 87 04/18/2013 0818   ALKPHOS 94 04/29/2012 0229   ALKPHOS 80 04/13/2011 0808   AST 20 04/18/2013 0818   AST 19 04/29/2012 0229   AST 28 04/13/2011 0808   ALT 19 04/18/2013 0818   ALT 16 04/29/2012 0229   ALT 25 04/13/2011 0808   BILITOT 0.82 04/18/2013 0818   BILITOT 1.1 04/29/2012 0229   BILITOT 1.10 04/13/2011 4037       RADIOGRAPHIC STUDIES: I reviewed the CT scan from September 2014 and I am concerned about dilated colon seen in the right upper quadrant which I think might be the cause of her pain I have personally reviewed the radiological images as listed and agreed with the findings in the report.  ASSESSMENT & PLAN:  #1 diffuse large B-cell lymphoma #2 right upper quadrant pain I express concerned that the patient is symptomatic with intermittent right upper quadrant pain. On review of the CT scan, the loops of bowel in the right upper quadrant is not normal and appears dilated. I recommend the patient to undergo another colonoscopy for evaluation. The patient will  contact her gastroenterologist to schedule this. Otherwise, I will see her back in 6 months with repeat history, physical examination and blood work.  Orders Placed This Encounter  Procedures  . CBC with Differential    Standing Status: Future     Number of Occurrences:      Standing Expiration Date: 04/23/2014  . Comprehensive metabolic panel    Standing Status: Future     Number of Occurrences:      Standing Expiration Date: 04/23/2014  . Lactate dehydrogenase    Standing Status: Future     Number of Occurrences:      Standing Expiration Date: 04/23/2014   All questions were answered. The patient knows to call the clinic with any problems, questions or concerns. No barriers to learning was detected. I spent 25 minutes counseling the patient face to face. The total time spent in the appointment was 30 minutes and more than 50% was on counseling and review of test results     [facility], [place], MD 04/23/2013 1:51 PM

## 2013-04-24 ENCOUNTER — Telehealth: Payer: Self-pay | Admitting: Hematology and Oncology

## 2013-04-24 NOTE — Telephone Encounter (Signed)
s.w. pt and advised on OCT appt....pt ok adn aware °

## 2013-05-07 ENCOUNTER — Other Ambulatory Visit: Payer: Self-pay | Admitting: Gastroenterology

## 2013-05-07 DIAGNOSIS — R1011 Right upper quadrant pain: Secondary | ICD-10-CM

## 2013-05-09 ENCOUNTER — Ambulatory Visit
Admission: RE | Admit: 2013-05-09 | Discharge: 2013-05-09 | Disposition: A | Discharge status: Home / Self Care | Payer: No Typology Code available for payment source | Source: Ambulatory Visit | Attending: Gastroenterology | Admitting: Gastroenterology

## 2013-05-09 DIAGNOSIS — R1011 Right upper quadrant pain: Secondary | ICD-10-CM

## 2013-05-31 ENCOUNTER — Telehealth: Payer: Self-pay | Admitting: Hematology and Oncology

## 2013-05-31 NOTE — Telephone Encounter (Signed)
pt called and r/s lab and MD to 9/21

## 2013-09-26 ENCOUNTER — Other Ambulatory Visit: Payer: Self-pay | Admitting: Gynecology

## 2013-09-27 LAB — CYTOLOGY - PAP

## 2013-10-02 ENCOUNTER — Other Ambulatory Visit: Payer: Self-pay | Admitting: Hematology and Oncology

## 2013-10-02 ENCOUNTER — Encounter: Payer: Self-pay | Admitting: Hematology and Oncology

## 2013-10-02 ENCOUNTER — Other Ambulatory Visit: Payer: Self-pay | Admitting: *Deleted

## 2013-10-02 DIAGNOSIS — C8590 Non-Hodgkin lymphoma, unspecified, unspecified site: Secondary | ICD-10-CM

## 2013-10-02 NOTE — Telephone Encounter (Signed)
Forwarded to Dr. Alvy Bimler to review.  Patient seen in March 2015 with plan for colonoscopy, lab and physical for September 2015 F/U.

## 2013-10-11 ENCOUNTER — Encounter (HOSPITAL_COMMUNITY): Payer: Self-pay

## 2013-10-11 ENCOUNTER — Ambulatory Visit (HOSPITAL_COMMUNITY)
Admission: RE | Admit: 2013-10-11 | Discharge: 2013-10-11 | Disposition: A | Discharge status: Home / Self Care | Payer: No Typology Code available for payment source | Source: Ambulatory Visit | Attending: Hematology and Oncology | Admitting: Hematology and Oncology

## 2013-10-11 ENCOUNTER — Other Ambulatory Visit (HOSPITAL_BASED_OUTPATIENT_CLINIC_OR_DEPARTMENT_OTHER): Payer: No Typology Code available for payment source

## 2013-10-11 DIAGNOSIS — Z9221 Personal history of antineoplastic chemotherapy: Secondary | ICD-10-CM | POA: Diagnosis not present

## 2013-10-11 DIAGNOSIS — C8590 Non-Hodgkin lymphoma, unspecified, unspecified site: Secondary | ICD-10-CM

## 2013-10-11 DIAGNOSIS — C8589 Other specified types of non-Hodgkin lymphoma, extranodal and solid organ sites: Secondary | ICD-10-CM | POA: Insufficient documentation

## 2013-10-11 LAB — CBC WITH DIFFERENTIAL/PLATELET
BASO%: 0.5 % (ref 0.0–2.0)
Basophils Absolute: 0 10*3/uL (ref 0.0–0.1)
EOS%: 2.9 % (ref 0.0–7.0)
Eosinophils Absolute: 0.2 10*3/uL (ref 0.0–0.5)
HCT: 43 % (ref 34.8–46.6)
HGB: 14.5 g/dL (ref 11.6–15.9)
LYMPH%: 38 % (ref 14.0–49.7)
MCH: 29 pg (ref 25.1–34.0)
MCHC: 33.6 g/dL (ref 31.5–36.0)
MCV: 86.3 fL (ref 79.5–101.0)
MONO#: 0.5 10*3/uL (ref 0.1–0.9)
MONO%: 7.3 % (ref 0.0–14.0)
NEUT#: 3.4 10*3/uL (ref 1.5–6.5)
NEUT%: 51.3 % (ref 38.4–76.8)
Platelets: 282 10*3/uL (ref 145–400)
RBC: 4.99 10*6/uL (ref 3.70–5.45)
RDW: 13.2 % (ref 11.2–14.5)
WBC: 6.6 10*3/uL (ref 3.9–10.3)
lymph#: 2.5 10*3/uL (ref 0.9–3.3)

## 2013-10-11 LAB — COMPREHENSIVE METABOLIC PANEL (CC13)
ALT: 22 U/L (ref 0–55)
AST: 27 U/L (ref 5–34)
Albumin: 4 g/dL (ref 3.5–5.0)
Alkaline Phosphatase: 68 U/L (ref 40–150)
Anion Gap: 11 mEq/L (ref 3–11)
BUN: 14.7 mg/dL (ref 7.0–26.0)
CO2: 27 mEq/L (ref 22–29)
Calcium: 9.5 mg/dL (ref 8.4–10.4)
Chloride: 103 mEq/L (ref 98–109)
Creatinine: 0.9 mg/dL (ref 0.6–1.1)
Glucose: 99 mg/dl (ref 70–140)
Potassium: 3.4 mEq/L — ABNORMAL LOW (ref 3.5–5.1)
Sodium: 142 mEq/L (ref 136–145)
Total Bilirubin: 0.95 mg/dL (ref 0.20–1.20)
Total Protein: 8 g/dL (ref 6.4–8.3)

## 2013-10-11 LAB — LACTATE DEHYDROGENASE (CC13): LDH: 186 U/L (ref 125–245)

## 2013-10-11 MED ORDER — IOHEXOL 300 MG/ML  SOLN
100.0000 mL | Freq: Once | INTRAMUSCULAR | Status: AC | PRN
  Administered 2013-10-11: 100 mL via INTRAVENOUS

## 2013-10-15 ENCOUNTER — Ambulatory Visit (HOSPITAL_BASED_OUTPATIENT_CLINIC_OR_DEPARTMENT_OTHER): Payer: No Typology Code available for payment source | Admitting: Hematology and Oncology

## 2013-10-15 ENCOUNTER — Other Ambulatory Visit: Payer: No Typology Code available for payment source

## 2013-10-15 ENCOUNTER — Encounter: Payer: Self-pay | Admitting: Hematology and Oncology

## 2013-10-15 VITALS — BP 124/80 | HR 73 | Temp 98.3°F | Resp 18 | Ht 66.5 in | Wt 202.6 lb

## 2013-10-15 DIAGNOSIS — C8589 Other specified types of non-Hodgkin lymphoma, extranodal and solid organ sites: Secondary | ICD-10-CM

## 2013-10-15 DIAGNOSIS — C8590 Non-Hodgkin lymphoma, unspecified, unspecified site: Secondary | ICD-10-CM

## 2013-10-15 DIAGNOSIS — R1031 Right lower quadrant pain: Secondary | ICD-10-CM

## 2013-10-15 NOTE — Assessment & Plan Note (Signed)
She has no signs of disease recurrence. I recommend discontinuation of routine imaging study. I will see her back a year from now with history, physical examination and blood work 

## 2013-10-15 NOTE — Progress Notes (Signed)
[place] [place] OFFICE PROGRESS NOTE  Patient Care Team: Aretta Nip, MD as PCP - General (Family Medicine) Arta Silence, MD as Consulting Physician (Gastroenterology)  SUMMARY OF ONCOLOGIC HISTORY: Oncology History   Lymphoma, high grade involving the terminal ileum   Primary site: Lymphoid Neoplasms   Staging method: AJCC 6th Edition   Clinical: Stage I signed by Heath Lark, MD on 04/23/2013  1:17 PM   Pathologic: Stage I signed by Heath Lark, MD on 04/23/2013  1:17 PM   Summary: Stage I       Lymphoma, high grade   04/28/2009 Procedure Colonoscopy and biopsy show possible lymphoma involving the terminal ileum   05/08/2009 Bone Marrow Biopsy Bone marrow biopsy was negative.   05/14/2009 Surgery The patient underwent a terminal ileum resection and ascending colon resection which confirmed diffuse large B-cell lymphoma.   06/06/2009 - 09/22/2009 Chemotherapy The patient completed 6 cycles of R. CHOP chemotherapy.   06/12/2009 Imaging PET CT scan show no evidence of residual disease.   10/16/2009 Imaging PET CT scan show no evidence of residual disease.   06/01/2012 Procedure Colonoscopy showed no evidence of recurrence of disease.   10/24/2012 Imaging CT scan show no evidence of recurrence of disease.   10/11/2013 Imaging CT scan show no evidence of disease.    INTERVAL HISTORY: Please see below for problem oriented charting. She has no symptoms apart from persistent intermittent right lower quadrant pain. She denies change in bowel habits.  REVIEW OF SYSTEMS:   Constitutional: Denies fevers, chills or abnormal weight loss Eyes: Denies blurriness of vision Ears, nose, mouth, throat, and face: Denies mucositis or sore throat Respiratory: Denies cough, dyspnea or wheezes Cardiovascular: Denies palpitation, chest discomfort or lower extremity swelling Gastrointestinal:  Denies nausea, heartburn or change in bowel habits Skin: Denies abnormal skin rashes Lymphatics: Denies  new lymphadenopathy or easy bruising Neurological:Denies numbness, tingling or new weaknesses Behavioral/Psych: Mood is stable, no new changes  All other systems were reviewed with the patient and are negative.  I have reviewed the past medical history, past surgical history, social history and family history with the patient and they are unchanged from previous note.  ALLERGIES:  is allergic to simvastatin and lisinopril.  MEDICATIONS:  Current Outpatient Prescriptions  Medication Sig Dispense Refill  . aspirin 81 MG tablet Take 81 mg by mouth every evening.       . Blood Glucose Monitoring Suppl (ONE TOUCH ULTRA MINI) W/DEVICE KIT       . fenofibrate (TRICOR) 145 MG tablet       . hydrochlorothiazide (HYDRODIURIL) 25 MG tablet Take 25 mg by mouth every morning.       Marland Kitchen LORazepam (ATIVAN) 1 MG tablet Take 0.5 mg by mouth at bedtime as needed (for sleep).       . metroNIDAZOLE (METROGEL) 0.75 % vaginal gel       . ONE TOUCH ULTRA TEST test strip       . ONETOUCH DELICA LANCETS 92Z MISC       . zolpidem (AMBIEN) 10 MG tablet Take 5-10 mg by mouth at bedtime as needed for sleep.       Marland Kitchen aspirin-acetaminophen-caffeine (EXCEDRIN MIGRAINE) 250-250-65 MG per tablet Take 1 tablet by mouth every 6 (six) hours as needed for pain (for headache).      . [DISCONTINUED] FLUoxetine (PROZAC) 20 MG tablet Take 20 mg by mouth daily.      . [DISCONTINUED] potassium chloride SA (K-DUR,KLOR-CON) 20 MEQ tablet Take  1 tablet (20 mEq total) by mouth 2 (two) times daily.  60 tablet  0   No current facility-administered medications for this visit.    PHYSICAL EXAMINATION: ECOG PERFORMANCE STATUS: 0 - Asymptomatic  Filed Vitals:   10/15/13 1234  BP: 124/80  Pulse: 73  Temp: 98.3 F (36.8 C)  Resp: 18   Filed Weights   10/15/13 1234  Weight: 202 lb 9.6 oz (91.899 kg)    GENERAL:alert, no distress and comfortable SKIN: skin color, texture, turgor are normal, no rashes or significant lesions EYES:  normal, Conjunctiva are pink and non-injected, sclera clear OROPHARYNX:no exudate, no erythema and lips, buccal mucosa, and tongue normal  NECK: supple, thyroid normal size, non-tender, without nodularity LYMPH:  no palpable lymphadenopathy in the cervical, axillary or inguinal LUNGS: clear to auscultation and percussion with normal breathing effort HEART: regular rate & rhythm and no murmurs and no lower extremity edema ABDOMEN:abdomen soft, non-tender and normal bowel sounds Musculoskeletal:no cyanosis of digits and no clubbing  NEURO: alert & oriented x 3 with fluent speech, no focal motor/sensory deficits  LABORATORY DATA:  I have reviewed the data as listed    Component Value Date/Time   NA 142 10/11/2013 0917   NA 135 04/29/2012 0229   NA 141 04/13/2011 0808   K 3.4* 10/11/2013 0917   K 3.1* 04/29/2012 0229   K 3.4 04/13/2011 0808   CL 95* 04/29/2012 0229   CL 103 04/10/2012 0824   CL 95* 04/13/2011 0808   CO2 27 10/11/2013 0917   CO2 32 04/29/2012 0229   CO2 32 04/13/2011 0808   GLUCOSE 99 10/11/2013 0917   GLUCOSE 157* 04/29/2012 0229   GLUCOSE 111* 04/10/2012 0824   GLUCOSE 104 04/13/2011 0808   BUN 14.7 10/11/2013 0917   BUN 10 04/29/2012 0229   BUN 10 04/13/2011 0808   CREATININE 0.9 10/11/2013 0917   CREATININE 0.77 04/29/2012 0229   CREATININE 0.8 04/13/2011 0808   CALCIUM 9.5 10/11/2013 0917   CALCIUM 9.0 04/29/2012 0229   CALCIUM 9.1 04/13/2011 0808   PROT 8.0 10/11/2013 0917   PROT 7.3 04/29/2012 0229   PROT 7.5 04/13/2011 0808   ALBUMIN 4.0 10/11/2013 0917   ALBUMIN 3.6 04/29/2012 0229   AST 27 10/11/2013 0917   AST 19 04/29/2012 0229   AST 28 04/13/2011 0808   ALT 22 10/11/2013 0917   ALT 16 04/29/2012 0229   ALT 25 04/13/2011 0808   ALKPHOS 68 10/11/2013 0917   ALKPHOS 94 04/29/2012 0229   ALKPHOS 80 04/13/2011 0808   BILITOT 0.95 10/11/2013 0917   BILITOT 1.1 04/29/2012 0229   BILITOT 1.10 04/13/2011 0808   GFRNONAA 89* 04/29/2012 0229   GFRAA >90 04/29/2012 0229    No results found for this  basename: SPEP, UPEP,  kappa and lambda light chains    Lab Results  Component Value Date   WBC 6.6 10/11/2013   NEUTROABS 3.4 10/11/2013   HGB 14.5 10/11/2013   HCT 43.0 10/11/2013   MCV 86.3 10/11/2013   PLT 282 10/11/2013      Chemistry      Component Value Date/Time   NA 142 10/11/2013 0917   NA 135 04/29/2012 0229   NA 141 04/13/2011 0808   K 3.4* 10/11/2013 0917   K 3.1* 04/29/2012 0229   K 3.4 04/13/2011 0808   CL 95* 04/29/2012 0229   CL 103 04/10/2012 0824   CL 95* 04/13/2011 0808   CO2 27 10/11/2013 0917  CO2 32 04/29/2012 0229   CO2 32 04/13/2011 0808   BUN 14.7 10/11/2013 0917   BUN 10 04/29/2012 0229   BUN 10 04/13/2011 0808   CREATININE 0.9 10/11/2013 0917   CREATININE 0.77 04/29/2012 0229   CREATININE 0.8 04/13/2011 0808      Component Value Date/Time   CALCIUM 9.5 10/11/2013 0917   CALCIUM 9.0 04/29/2012 0229   CALCIUM 9.1 04/13/2011 0808   ALKPHOS 68 10/11/2013 0917   ALKPHOS 94 04/29/2012 0229   ALKPHOS 80 04/13/2011 0808   AST 27 10/11/2013 0917   AST 19 04/29/2012 0229   AST 28 04/13/2011 0808   ALT 22 10/11/2013 0917   ALT 16 04/29/2012 0229   ALT 25 04/13/2011 0808   BILITOT 0.95 10/11/2013 0917   BILITOT 1.1 04/29/2012 0229   BILITOT 1.10 04/13/2011 8412       RADIOGRAPHIC STUDIES: CT scan show no evidence of disease I have personally reviewed the radiological images as listed and agreed with the findings in the report.  ASSESSMENT & PLAN:  Lymphoma, high grade She has no signs of disease recurrence. I recommend discontinuation of routine imaging study. I will see her back a year from now with history, physical examination and blood work   Orders Placed This Encounter  Procedures  . CBC with Differential    Standing Status: Future     Number of Occurrences:      Standing Expiration Date: 11/19/2014  . Comprehensive metabolic panel    Standing Status: Future     Number of Occurrences:      Standing Expiration Date: 11/19/2014  . Lactate dehydrogenase    Standing  Status: Future     Number of Occurrences:      Standing Expiration Date: 11/19/2014   All questions were answered. The patient knows to call the clinic with any problems, questions or concerns. No barriers to learning was detected. I spent 25 minutes counseling the patient face to face. The total time spent in the appointment was 30 minutes and more than 50% was on counseling and review of test results     [facility], [place], MD 10/15/2013 9:02 PM

## 2013-10-17 ENCOUNTER — Telehealth: Payer: Self-pay | Admitting: Hematology and Oncology

## 2013-10-17 NOTE — Telephone Encounter (Signed)
s.w. pt and advisedon March 2016 appt....pt ok and aware °

## 2013-10-25 ENCOUNTER — Other Ambulatory Visit: Payer: No Typology Code available for payment source

## 2013-10-25 ENCOUNTER — Ambulatory Visit: Payer: No Typology Code available for payment source | Admitting: Hematology and Oncology

## 2014-01-10 ENCOUNTER — Encounter: Payer: Self-pay | Admitting: Hematology and Oncology

## 2014-04-17 ENCOUNTER — Other Ambulatory Visit (HOSPITAL_BASED_OUTPATIENT_CLINIC_OR_DEPARTMENT_OTHER): Payer: No Typology Code available for payment source

## 2014-04-17 ENCOUNTER — Encounter: Payer: Self-pay | Admitting: Hematology and Oncology

## 2014-04-17 ENCOUNTER — Ambulatory Visit (HOSPITAL_BASED_OUTPATIENT_CLINIC_OR_DEPARTMENT_OTHER): Payer: No Typology Code available for payment source | Admitting: Hematology and Oncology

## 2014-04-17 ENCOUNTER — Telehealth: Payer: Self-pay | Admitting: Hematology and Oncology

## 2014-04-17 VITALS — BP 122/75 | HR 66 | Temp 98.1°F | Resp 18 | Ht 66.0 in | Wt 205.5 lb

## 2014-04-17 DIAGNOSIS — R1031 Right lower quadrant pain: Secondary | ICD-10-CM | POA: Diagnosis not present

## 2014-04-17 DIAGNOSIS — R1011 Right upper quadrant pain: Secondary | ICD-10-CM | POA: Insufficient documentation

## 2014-04-17 DIAGNOSIS — Z8579 Personal history of other malignant neoplasms of lymphoid, hematopoietic and related tissues: Secondary | ICD-10-CM

## 2014-04-17 DIAGNOSIS — C8590 Non-Hodgkin lymphoma, unspecified, unspecified site: Secondary | ICD-10-CM

## 2014-04-17 LAB — CBC WITH DIFFERENTIAL/PLATELET
BASO%: 0.2 % (ref 0.0–2.0)
Basophils Absolute: 0 10*3/uL (ref 0.0–0.1)
EOS%: 4.1 % (ref 0.0–7.0)
Eosinophils Absolute: 0.2 10*3/uL (ref 0.0–0.5)
HCT: 43 % (ref 34.8–46.6)
HGB: 14.4 g/dL (ref 11.6–15.9)
LYMPH%: 40.4 % (ref 14.0–49.7)
MCH: 28.4 pg (ref 25.1–34.0)
MCHC: 33.6 g/dL (ref 31.5–36.0)
MCV: 84.7 fL (ref 79.5–101.0)
MONO#: 0.4 10*3/uL (ref 0.1–0.9)
MONO%: 6.5 % (ref 0.0–14.0)
NEUT#: 2.8 10*3/uL (ref 1.5–6.5)
NEUT%: 48.8 % (ref 38.4–76.8)
Platelets: 264 10*3/uL (ref 145–400)
RBC: 5.08 10*6/uL (ref 3.70–5.45)
RDW: 13.1 % (ref 11.2–14.5)
WBC: 5.7 10*3/uL (ref 3.9–10.3)
lymph#: 2.3 10*3/uL (ref 0.9–3.3)

## 2014-04-17 LAB — COMPREHENSIVE METABOLIC PANEL (CC13)
ALT: 21 U/L (ref 0–55)
AST: 20 U/L (ref 5–34)
Albumin: 3.8 g/dL (ref 3.5–5.0)
Alkaline Phosphatase: 70 U/L (ref 40–150)
Anion Gap: 9 mEq/L (ref 3–11)
BUN: 9.3 mg/dL (ref 7.0–26.0)
CO2: 30 mEq/L — ABNORMAL HIGH (ref 22–29)
Calcium: 9.2 mg/dL (ref 8.4–10.4)
Chloride: 102 mEq/L (ref 98–109)
Creatinine: 0.8 mg/dL (ref 0.6–1.1)
EGFR: 77 mL/min/{1.73_m2} — ABNORMAL LOW (ref >90)
Glucose: 135 mg/dl (ref 70–140)
Potassium: 3.9 mEq/L (ref 3.5–5.1)
Sodium: 141 mEq/L (ref 136–145)
Total Bilirubin: 1.02 mg/dL (ref 0.20–1.20)
Total Protein: 7.5 g/dL (ref 6.4–8.3)

## 2014-04-17 LAB — LACTATE DEHYDROGENASE (CC13): LDH: 173 U/L (ref 125–245)

## 2014-04-17 NOTE — Progress Notes (Signed)
[place] OFFICE PROGRESS NOTE  Patient Care Team: Aretta Nip, MD as PCP - General (Family Medicine) Arta Silence, MD as Consulting Physician (Gastroenterology)  SUMMARY OF ONCOLOGIC HISTORY: Oncology History   Lymphoma, high grade involving the terminal ileum   Primary site: Lymphoid Neoplasms   Staging method: AJCC 6th Edition   Clinical: Stage I signed by Heath Lark, MD on 04/23/2013  1:17 PM   Pathologic: Stage I signed by Heath Lark, MD on 04/23/2013  1:17 PM   Summary: Stage I       Lymphoma, high grade   04/28/2009 Procedure Colonoscopy and biopsy show possible lymphoma involving the terminal ileum   05/08/2009 Bone Marrow Biopsy Bone marrow biopsy was negative.   05/14/2009 Surgery The patient underwent a terminal ileum resection and ascending colon resection which confirmed diffuse large B-cell lymphoma.   06/06/2009 - 09/22/2009 Chemotherapy The patient completed 6 cycles of R. CHOP chemotherapy.   06/12/2009 Imaging PET CT scan show no evidence of residual disease.   10/16/2009 Imaging PET CT scan show no evidence of residual disease.   06/01/2012 Procedure Colonoscopy showed no evidence of recurrence of disease.   10/24/2012 Imaging CT scan show no evidence of recurrence of disease.   10/11/2013 Imaging CT scan show no evidence of disease.    INTERVAL HISTORY: Please see below for problem oriented charting. She is seen for further evaluation. She denies new lymphadenopathy. She continues to have intermittent right lower quadrant discomfort.  REVIEW OF SYSTEMS:   Constitutional: Denies fevers, chills or abnormal weight loss Eyes: Denies blurriness of vision Ears, nose, mouth, throat, and face: Denies mucositis or sore throat Respiratory: Denies cough, dyspnea or wheezes Cardiovascular: Denies palpitation, chest discomfort or lower extremity swelling Gastrointestinal:  Denies nausea, heartburn or change in bowel habits Skin: Denies abnormal skin  rashes Lymphatics: Denies new lymphadenopathy or easy bruising Neurological:Denies numbness, tingling or new weaknesses Behavioral/Psych: Mood is stable, no new changes  All other systems were reviewed with the patient and are negative.  I have reviewed the past medical history, past surgical history, social history and family history with the patient and they are unchanged from previous note.  ALLERGIES:  is allergic to simvastatin and lisinopril.  MEDICATIONS:  Current Outpatient Prescriptions  Medication Sig Dispense Refill  . aspirin 81 MG tablet Take 81 mg by mouth every evening.     Marland Kitchen aspirin-acetaminophen-caffeine (EXCEDRIN MIGRAINE) 250-250-65 MG per tablet Take 1 tablet by mouth every 6 (six) hours as needed for pain (for headache).    . Blood Glucose Monitoring Suppl (ONE TOUCH ULTRA MINI) W/DEVICE KIT     . hydrochlorothiazide (HYDRODIURIL) 25 MG tablet Take 25 mg by mouth every morning.     Marland Kitchen LORazepam (ATIVAN) 1 MG tablet Take 0.5 mg by mouth at bedtime as needed (for sleep).     . ONE TOUCH ULTRA TEST test strip     . ONETOUCH DELICA LANCETS 11B MISC     . zolpidem (AMBIEN) 10 MG tablet Take 5-10 mg by mouth at bedtime as needed for sleep.     . [DISCONTINUED] FLUoxetine (PROZAC) 20 MG tablet Take 20 mg by mouth daily.    . [DISCONTINUED] potassium chloride SA (K-DUR,KLOR-CON) 20 MEQ tablet Take 1 tablet (20 mEq total) by mouth 2 (two) times daily. 60 tablet 0   No current facility-administered medications for this visit.    PHYSICAL EXAMINATION: ECOG PERFORMANCE STATUS: 0 - Asymptomatic  Filed Vitals:   04/17/14 5208  BP: 122/75  Pulse: 66  Temp: 98.1 F (36.7 C)  Resp: 18   Filed Weights   04/17/14 0842  Weight: 205 lb 8 oz (93.214 kg)    GENERAL:alert, no distress and comfortable SKIN: skin color, texture, turgor are normal, no rashes or significant lesions EYES: normal, Conjunctiva are pink and non-injected, sclera clear OROPHARYNX:no exudate, no  erythema and lips, buccal mucosa, and tongue normal  NECK: supple, thyroid normal size, non-tender, without nodularity LYMPH:  no palpable lymphadenopathy in the cervical, axillary or inguinal LUNGS: clear to auscultation and percussion with normal breathing effort HEART: regular rate & rhythm and no murmurs and no lower extremity edema ABDOMEN:abdomen soft, non-tender and normal bowel sounds. Well-healed surgical scar with no palpable anomalies, rebound or guarding. Musculoskeletal:no cyanosis of digits and no clubbing  NEURO: alert & oriented x 3 with fluent speech, no focal motor/sensory deficits  LABORATORY DATA:  I have reviewed the data as listed    Component Value Date/Time   NA 141 04/17/2014 0828   NA 135 04/29/2012 0229   NA 141 04/13/2011 0808   K 3.9 04/17/2014 0828   K 3.1* 04/29/2012 0229   K 3.4 04/13/2011 0808   CL 95* 04/29/2012 0229   CL 103 04/10/2012 0824   CL 95* 04/13/2011 0808   CO2 30* 04/17/2014 0828   CO2 32 04/29/2012 0229   CO2 32 04/13/2011 0808   GLUCOSE 135 04/17/2014 0828   GLUCOSE 157* 04/29/2012 0229   GLUCOSE 111* 04/10/2012 0824   GLUCOSE 104 04/13/2011 0808   BUN 9.3 04/17/2014 0828   BUN 10 04/29/2012 0229   BUN 10 04/13/2011 0808   CREATININE 0.8 04/17/2014 0828   CREATININE 0.77 04/29/2012 0229   CREATININE 0.8 04/13/2011 0808   CALCIUM 9.2 04/17/2014 0828   CALCIUM 9.0 04/29/2012 0229   CALCIUM 9.1 04/13/2011 0808   PROT 7.5 04/17/2014 0828   PROT 7.3 04/29/2012 0229   PROT 7.5 04/13/2011 0808   ALBUMIN 3.8 04/17/2014 0828   ALBUMIN 3.6 04/29/2012 0229   AST 20 04/17/2014 0828   AST 19 04/29/2012 0229   AST 28 04/13/2011 0808   ALT 21 04/17/2014 0828   ALT 16 04/29/2012 0229   ALT 25 04/13/2011 0808   ALKPHOS 70 04/17/2014 0828   ALKPHOS 94 04/29/2012 0229   ALKPHOS 80 04/13/2011 0808   BILITOT 1.02 04/17/2014 0828   BILITOT 1.1 04/29/2012 0229   BILITOT 1.10 04/13/2011 0808   GFRNONAA 89* 04/29/2012 0229   GFRAA >90  04/29/2012 0229    No results found for: SPEP, UPEP  Lab Results  Component Value Date   WBC 5.7 04/17/2014   NEUTROABS 2.8 04/17/2014   HGB 14.4 04/17/2014   HCT 43.0 04/17/2014   MCV 84.7 04/17/2014   PLT 264 04/17/2014      Chemistry      Component Value Date/Time   NA 141 04/17/2014 0828   NA 135 04/29/2012 0229   NA 141 04/13/2011 0808   K 3.9 04/17/2014 0828   K 3.1* 04/29/2012 0229   K 3.4 04/13/2011 0808   CL 95* 04/29/2012 0229   CL 103 04/10/2012 0824   CL 95* 04/13/2011 0808   CO2 30* 04/17/2014 0828   CO2 32 04/29/2012 0229   CO2 32 04/13/2011 0808   BUN 9.3 04/17/2014 0828   BUN 10 04/29/2012 0229   BUN 10 04/13/2011 0808   CREATININE 0.8 04/17/2014 0828   CREATININE 0.77 04/29/2012 0229   CREATININE 0.8  04/13/2011 0808      Component Value Date/Time   CALCIUM 9.2 04/17/2014 0828   CALCIUM 9.0 04/29/2012 0229   CALCIUM 9.1 04/13/2011 0808   ALKPHOS 70 04/17/2014 0828   ALKPHOS 94 04/29/2012 0229   ALKPHOS 80 04/13/2011 0808   AST 20 04/17/2014 0828   AST 19 04/29/2012 0229   AST 28 04/13/2011 0808   ALT 21 04/17/2014 0828   ALT 16 04/29/2012 0229   ALT 25 04/13/2011 0808   BILITOT 1.02 04/17/2014 0828   BILITOT 1.1 04/29/2012 0229   BILITOT 1.10 04/13/2011 0808     ASSESSMENT & PLAN:  Lymphoma, high grade She has no signs of disease recurrence. I recommend discontinuation of routine imaging study. I will see her back a year from now with history, physical examination and blood work   Right lower quadrant abdominal pain She continues to have intermittent, right lower quadrant pain which I felt is related to prior surgery. Her last imaging study last year showed no evidence of disease recurrence. I reassured the patient.     Orders Placed This Encounter  Procedures  . CBC with Differential/Platelet    Standing Status: Future     Number of Occurrences:      Standing Expiration Date: 05/22/2015  . Comprehensive metabolic panel     Standing Status: Future     Number of Occurrences:      Standing Expiration Date: 05/22/2015  . Lactate dehydrogenase    Standing Status: Future     Number of Occurrences:      Standing Expiration Date: 05/22/2015   All questions were answered. The patient knows to call the clinic with any problems, questions or concerns. No barriers to learning was detected. I spent 15 minutes counseling the patient face to face. The total time spent in the appointment was 20 minutes and more than 50% was on counseling and review of test results     [facility], [place], MD 04/17/2014 4:55 PM

## 2014-04-17 NOTE — Assessment & Plan Note (Signed)
She continues to have intermittent, right lower quadrant pain which I felt is related to prior surgery. Her last imaging study last year showed no evidence of disease recurrence. I reassured the patient.

## 2014-04-17 NOTE — Telephone Encounter (Signed)
spoke with patient and advised on March 2017 appt....patient ok and aware

## 2014-04-17 NOTE — Assessment & Plan Note (Signed)
She has no signs of disease recurrence. I recommend discontinuation of routine imaging study. I will see her back a year from now with history, physical examination and blood work

## 2014-10-01 ENCOUNTER — Other Ambulatory Visit: Payer: Self-pay | Admitting: Gynecology

## 2014-10-02 LAB — CYTOLOGY - PAP

## 2014-10-08 ENCOUNTER — Encounter: Payer: Self-pay | Admitting: Hematology and Oncology

## 2014-10-13 ENCOUNTER — Encounter: Payer: Self-pay | Admitting: Hematology and Oncology

## 2015-02-09 ENCOUNTER — Encounter: Payer: Self-pay | Admitting: Hematology and Oncology

## 2015-02-10 ENCOUNTER — Other Ambulatory Visit: Payer: Self-pay | Admitting: Hematology and Oncology

## 2015-02-10 DIAGNOSIS — C8590 Non-Hodgkin lymphoma, unspecified, unspecified site: Secondary | ICD-10-CM

## 2015-03-23 ENCOUNTER — Encounter: Payer: Self-pay | Admitting: Hematology and Oncology

## 2015-03-25 ENCOUNTER — Other Ambulatory Visit: Payer: Self-pay | Admitting: Hematology and Oncology

## 2015-03-25 ENCOUNTER — Telehealth: Payer: Self-pay | Admitting: Hematology and Oncology

## 2015-03-25 DIAGNOSIS — C8590 Non-Hodgkin lymphoma, unspecified, unspecified site: Secondary | ICD-10-CM

## 2015-03-25 DIAGNOSIS — R1031 Right lower quadrant pain: Secondary | ICD-10-CM

## 2015-03-25 NOTE — Telephone Encounter (Signed)
per pof to sch pt appt-cld & spoke to ptt and gave pt appt time * dates for 3/20 & 3/21-adv [facility] will call to [facility] scan-adv to pick up contrast

## 2015-04-11 ENCOUNTER — Telehealth: Payer: Self-pay | Admitting: Hematology and Oncology

## 2015-04-11 NOTE — Telephone Encounter (Signed)
Patient called and rescheduled 3/21 f/u to 4/3 due to ct moved to 3/28 due to insurance reasons. Patient will keep lab as scheduled for 3/20 @ 8 am. Patient has appointments date/times for lab 3/20, ct 3/28 and NG 4/3.

## 2015-04-14 ENCOUNTER — Ambulatory Visit (HOSPITAL_COMMUNITY): Payer: Managed Care, Other (non HMO)

## 2015-04-14 ENCOUNTER — Other Ambulatory Visit: Payer: No Typology Code available for payment source

## 2015-04-14 ENCOUNTER — Other Ambulatory Visit (HOSPITAL_BASED_OUTPATIENT_CLINIC_OR_DEPARTMENT_OTHER): Payer: Managed Care, Other (non HMO)

## 2015-04-14 DIAGNOSIS — C8580 Other specified types of non-Hodgkin lymphoma, unspecified site: Secondary | ICD-10-CM | POA: Diagnosis not present

## 2015-04-14 DIAGNOSIS — C8590 Non-Hodgkin lymphoma, unspecified, unspecified site: Secondary | ICD-10-CM

## 2015-04-14 LAB — CBC WITH DIFFERENTIAL/PLATELET
BASO%: 0.7 % (ref 0.0–2.0)
Basophils Absolute: 0.1 10*3/uL (ref 0.0–0.1)
EOS%: 2.8 % (ref 0.0–7.0)
Eosinophils Absolute: 0.2 10*3/uL (ref 0.0–0.5)
HCT: 44.7 % (ref 34.8–46.6)
HGB: 15 g/dL (ref 11.6–15.9)
LYMPH%: 42.1 % (ref 14.0–49.7)
MCH: 28.9 pg (ref 25.1–34.0)
MCHC: 33.6 g/dL (ref 31.5–36.0)
MCV: 86.1 fL (ref 79.5–101.0)
MONO#: 0.5 10*3/uL (ref 0.1–0.9)
MONO%: 7 % (ref 0.0–14.0)
NEUT#: 3.3 10*3/uL (ref 1.5–6.5)
NEUT%: 47.4 % (ref 38.4–76.8)
Platelets: 225 10*3/uL (ref 145–400)
RBC: 5.19 10*6/uL (ref 3.70–5.45)
RDW: 12.8 % (ref 11.2–14.5)
WBC: 7 10*3/uL (ref 3.9–10.3)
lymph#: 3 10*3/uL (ref 0.9–3.3)

## 2015-04-14 LAB — COMPREHENSIVE METABOLIC PANEL
ALT: 19 U/L (ref 0–55)
AST: 17 U/L (ref 5–34)
Albumin: 3.7 g/dL (ref 3.5–5.0)
Alkaline Phosphatase: 72 U/L (ref 40–150)
Anion Gap: 9 mEq/L (ref 3–11)
BUN: 10.3 mg/dL (ref 7.0–26.0)
CO2: 30 mEq/L — ABNORMAL HIGH (ref 22–29)
Calcium: 9.4 mg/dL (ref 8.4–10.4)
Chloride: 102 mEq/L (ref 98–109)
Creatinine: 0.9 mg/dL (ref 0.6–1.1)
EGFR: 67 mL/min/{1.73_m2} — ABNORMAL LOW (ref >90)
Glucose: 138 mg/dl (ref 70–140)
Potassium: 4.1 mEq/L (ref 3.5–5.1)
Sodium: 141 mEq/L (ref 136–145)
Total Bilirubin: 0.83 mg/dL (ref 0.20–1.20)
Total Protein: 7.7 g/dL (ref 6.4–8.3)

## 2015-04-14 LAB — LACTATE DEHYDROGENASE: LDH: 173 U/L (ref 125–245)

## 2015-04-15 ENCOUNTER — Other Ambulatory Visit: Payer: No Typology Code available for payment source

## 2015-04-15 ENCOUNTER — Ambulatory Visit: Payer: No Typology Code available for payment source | Admitting: Hematology and Oncology

## 2015-04-18 ENCOUNTER — Telehealth: Payer: Self-pay | Admitting: Hematology and Oncology

## 2015-04-18 NOTE — Telephone Encounter (Signed)
Faxed pt path reports to [place] [phone]

## 2015-04-22 ENCOUNTER — Ambulatory Visit (HOSPITAL_COMMUNITY)
Admission: RE | Admit: 2015-04-22 | Discharge: 2015-04-22 | Disposition: A | Discharge status: Home / Self Care | Payer: Managed Care, Other (non HMO) | Source: Ambulatory Visit | Attending: Hematology and Oncology | Admitting: Hematology and Oncology

## 2015-04-22 ENCOUNTER — Encounter (HOSPITAL_COMMUNITY): Payer: Self-pay

## 2015-04-22 DIAGNOSIS — Z8572 Personal history of non-Hodgkin lymphomas: Secondary | ICD-10-CM | POA: Insufficient documentation

## 2015-04-22 DIAGNOSIS — R1031 Right lower quadrant pain: Secondary | ICD-10-CM | POA: Insufficient documentation

## 2015-04-22 DIAGNOSIS — I7090 Unspecified atherosclerosis: Secondary | ICD-10-CM | POA: Insufficient documentation

## 2015-04-22 DIAGNOSIS — C8590 Non-Hodgkin lymphoma, unspecified, unspecified site: Secondary | ICD-10-CM

## 2015-04-22 MED ORDER — IOPAMIDOL (ISOVUE-300) INJECTION 61%
100.0000 mL | Freq: Once | INTRAVENOUS | Status: AC | PRN
  Administered 2015-04-22: 100 mL via INTRAVENOUS

## 2015-04-28 ENCOUNTER — Ambulatory Visit (HOSPITAL_BASED_OUTPATIENT_CLINIC_OR_DEPARTMENT_OTHER): Payer: Managed Care, Other (non HMO) | Admitting: Hematology and Oncology

## 2015-04-28 ENCOUNTER — Encounter: Payer: Self-pay | Admitting: Hematology and Oncology

## 2015-04-28 VITALS — BP 114/69 | HR 76 | Temp 98.1°F | Resp 18 | Wt 204.0 lb

## 2015-04-28 DIAGNOSIS — Z8572 Personal history of non-Hodgkin lymphomas: Secondary | ICD-10-CM | POA: Diagnosis not present

## 2015-04-28 DIAGNOSIS — R1031 Right lower quadrant pain: Secondary | ICD-10-CM | POA: Diagnosis not present

## 2015-04-28 NOTE — Progress Notes (Signed)
[place] OFFICE PROGRESS NOTE  Patient Care Team: Aretta Nip, MD as PCP - General (Family Medicine) Arta Silence, MD as Consulting Physician (Gastroenterology)  SUMMARY OF ONCOLOGIC HISTORY: Oncology History   Lymphoma, high grade involving the terminal ileum   Primary site: Lymphoid Neoplasms   Staging method: AJCC 6th Edition   Clinical: Stage I signed by Heath Lark, MD on 04/23/2013  1:17 PM   Pathologic: Stage I signed by Heath Lark, MD on 04/23/2013  1:17 PM   Summary: Stage I       History of B-cell lymphoma   04/28/2009 Procedure Colonoscopy and biopsy show possible lymphoma involving the terminal ileum   05/08/2009 Bone Marrow Biopsy Bone marrow biopsy was negative.   05/14/2009 Surgery The patient underwent a terminal ileum resection and ascending colon resection which confirmed diffuse large B-cell lymphoma.   06/06/2009 - 09/22/2009 Chemotherapy The patient completed 6 cycles of R. CHOP chemotherapy.   06/12/2009 Imaging PET CT scan show no evidence of residual disease.   10/16/2009 Imaging PET CT scan show no evidence of residual disease.   06/01/2012 Procedure Colonoscopy showed no evidence of recurrence of disease.   10/24/2012 Imaging CT scan show no evidence of recurrence of disease.   10/11/2013 Imaging CT scan show no evidence of disease.   04/22/2015 Imaging CT scan show no evidence of disease.    INTERVAL HISTORY: Please see below for problem oriented charting. She returns for further follow-up. Denies new lymphadenopathy. She continues to have intermittent right lower quadrant discomfort. No change in bowel habits.  REVIEW OF SYSTEMS:   Constitutional: Denies fevers, chills or abnormal weight loss Eyes: Denies blurriness of vision Ears, nose, mouth, throat, and face: Denies mucositis or sore throat Respiratory: Denies cough, dyspnea or wheezes Cardiovascular: Denies palpitation, chest discomfort or lower extremity swelling Gastrointestinal:   Denies nausea, heartburn or change in bowel habits Skin: Denies abnormal skin rashes Lymphatics: Denies new lymphadenopathy or easy bruising Neurological:Denies numbness, tingling or new weaknesses Behavioral/Psych: Mood is stable, no new changes  All other systems were reviewed with the patient and are negative.  I have reviewed the past medical history, past surgical history, social history and family history with the patient and they are unchanged from previous note.  ALLERGIES:  is allergic to simvastatin and lisinopril.  MEDICATIONS:  Current Outpatient Prescriptions  Medication Sig Dispense Refill  . aspirin 81 MG tablet Take 81 mg by mouth every evening.     Marland Kitchen aspirin-acetaminophen-caffeine (EXCEDRIN MIGRAINE) 250-250-65 MG per tablet Take 1 tablet by mouth every 6 (six) hours as needed for pain (for headache).    . Blood Glucose Monitoring Suppl (ONE TOUCH ULTRA MINI) W/DEVICE KIT     . hydrochlorothiazide (HYDRODIURIL) 25 MG tablet Take 25 mg by mouth every morning.     Marland Kitchen LORazepam (ATIVAN) 1 MG tablet Take 0.5 mg by mouth at bedtime as needed (for sleep).     . ONE TOUCH ULTRA TEST test strip     . ONETOUCH DELICA LANCETS 94I MISC     . zolpidem (AMBIEN) 10 MG tablet Take 5-10 mg by mouth at bedtime as needed for sleep.     . [DISCONTINUED] FLUoxetine (PROZAC) 20 MG tablet Take 20 mg by mouth daily.    . [DISCONTINUED] potassium chloride SA (K-DUR,KLOR-CON) 20 MEQ tablet Take 1 tablet (20 mEq total) by mouth 2 (two) times daily. 60 tablet 0   No current facility-administered medications for this visit.    PHYSICAL  EXAMINATION: ECOG PERFORMANCE STATUS: 0 - Asymptomatic  Filed Vitals:   04/28/15 1151  BP: 114/69  Pulse: 76  Temp: 98.1 F (36.7 C)  Resp: 18   Filed Weights   04/28/15 1151  Weight: 204 lb (92.534 kg)    GENERAL:alert, no distress and comfortable. She is mildly obese SKIN: skin color, texture, turgor are normal, no rashes or significant  lesions EYES: normal, Conjunctiva are pink and non-injected, sclera clear OROPHARYNX:no exudate, no erythema and lips, buccal mucosa, and tongue normal  NECK: supple, thyroid normal size, non-tender, without nodularity LYMPH:  no palpable lymphadenopathy in the cervical, axillary or inguinal LUNGS: clear to auscultation and percussion with normal breathing effort HEART: regular rate & rhythm and no murmurs and no lower extremity edema ABDOMEN:abdomen soft, non-tender and normal bowel sounds Musculoskeletal:no cyanosis of digits and no clubbing  NEURO: alert & oriented x 3 with fluent speech, no focal motor/sensory deficits  LABORATORY DATA:  I have reviewed the data as listed    Component Value Date/Time   NA 141 04/14/2015 0838   NA 135 04/29/2012 0229   NA 141 04/13/2011 0808   K 4.1 04/14/2015 0838   K 3.1* 04/29/2012 0229   K 3.4 04/13/2011 0808   CL 95* 04/29/2012 0229   CL 103 04/10/2012 0824   CL 95* 04/13/2011 0808   CO2 30* 04/14/2015 0838   CO2 32 04/29/2012 0229   CO2 32 04/13/2011 0808   GLUCOSE 138 04/14/2015 0838   GLUCOSE 157* 04/29/2012 0229   GLUCOSE 111* 04/10/2012 0824   GLUCOSE 104 04/13/2011 0808   BUN 10.3 04/14/2015 0838   BUN 10 04/29/2012 0229   BUN 10 04/13/2011 0808   CREATININE 0.9 04/14/2015 0838   CREATININE 0.77 04/29/2012 0229   CREATININE 0.8 04/13/2011 0808   CALCIUM 9.4 04/14/2015 0838   CALCIUM 9.0 04/29/2012 0229   CALCIUM 9.1 04/13/2011 0808   PROT 7.7 04/14/2015 0838   PROT 7.3 04/29/2012 0229   PROT 7.5 04/13/2011 0808   ALBUMIN 3.7 04/14/2015 0838   ALBUMIN 3.6 04/29/2012 0229   ALBUMIN 3.6 04/13/2011 0808   AST 17 04/14/2015 0838   AST 19 04/29/2012 0229   AST 28 04/13/2011 0808   ALT 19 04/14/2015 0838   ALT 16 04/29/2012 0229   ALT 25 04/13/2011 0808   ALKPHOS 72 04/14/2015 0838   ALKPHOS 94 04/29/2012 0229   ALKPHOS 80 04/13/2011 0808   BILITOT 0.83 04/14/2015 0838   BILITOT 1.1 04/29/2012 0229   BILITOT 1.10  04/13/2011 0808   GFRNONAA 89* 04/29/2012 0229   GFRAA >90 04/29/2012 0229    No results found for: SPEP, UPEP  Lab Results  Component Value Date   WBC 7.0 04/14/2015   NEUTROABS 3.3 04/14/2015   HGB 15.0 04/14/2015   HCT 44.7 04/14/2015   MCV 86.1 04/14/2015   PLT 225 04/14/2015      Chemistry      Component Value Date/Time   NA 141 04/14/2015 0838   NA 135 04/29/2012 0229   NA 141 04/13/2011 0808   K 4.1 04/14/2015 0838   K 3.1* 04/29/2012 0229   K 3.4 04/13/2011 0808   CL 95* 04/29/2012 0229   CL 103 04/10/2012 0824   CL 95* 04/13/2011 0808   CO2 30* 04/14/2015 0838   CO2 32 04/29/2012 0229   CO2 32 04/13/2011 0808   BUN 10.3 04/14/2015 0838   BUN 10 04/29/2012 0229   BUN 10 04/13/2011 0808   CREATININE  0.9 04/14/2015 0838   CREATININE 0.77 04/29/2012 0229   CREATININE 0.8 04/13/2011 0808      Component Value Date/Time   CALCIUM 9.4 04/14/2015 0838   CALCIUM 9.0 04/29/2012 0229   CALCIUM 9.1 04/13/2011 0808   ALKPHOS 72 04/14/2015 0838   ALKPHOS 94 04/29/2012 0229   ALKPHOS 80 04/13/2011 0808   AST 17 04/14/2015 0838   AST 19 04/29/2012 0229   AST 28 04/13/2011 0808   ALT 19 04/14/2015 0838   ALT 16 04/29/2012 0229   ALT 25 04/13/2011 0808   BILITOT 0.83 04/14/2015 0838   BILITOT 1.1 04/29/2012 0229   BILITOT 1.10 04/13/2011 0808       RADIOGRAPHIC STUDIES:I reviewed the imaging study myself I have personally reviewed the radiological images as listed and agreed with the findings in the report.    ASSESSMENT & PLAN:  History of B-cell lymphoma She has no signs of disease recurrence. I recommend discontinuation of routine imaging study. She is a long-time cancer survivor. I will transition her care to cancer survivorship clinic.   Right lower quadrant abdominal pain She continues to have intermittent, right lower quadrant pain which I felt is related to prior surgery. Her last imaging study showed no evidence of disease recurrence. I  reassured the patient.      Orders Placed This Encounter  Procedures  . Amb Referral to Survivorship Long term    Referral Priority:  Routine    Referral Type:  Consultation    Referred to Provider:  Holley Bouche, NP    Number of Visits Requested:  1   All questions were answered. The patient knows to call the clinic with any problems, questions or concerns. No barriers to learning was detected. I spent 15 minutes counseling the patient face to face. The total time spent in the appointment was 20 minutes and more than 50% was on counseling and review of test results     [facility], [place], MD 04/28/2015 12:02 PM

## 2015-04-28 NOTE — Assessment & Plan Note (Signed)
She continues to have intermittent, right lower quadrant pain which I felt is related to prior surgery. Her last imaging study showed no evidence of disease recurrence. I reassured the patient.

## 2015-04-28 NOTE — Assessment & Plan Note (Signed)
She has no signs of disease recurrence. I recommend discontinuation of routine imaging study. She is a long-time cancer survivor. I will transition her care to cancer survivorship clinic.

## 2015-05-06 ENCOUNTER — Telehealth: Payer: Self-pay | Admitting: Adult Health

## 2015-05-06 ENCOUNTER — Other Ambulatory Visit: Payer: Self-pay | Admitting: Adult Health

## 2015-05-06 DIAGNOSIS — Z8572 Personal history of non-Hodgkin lymphomas: Secondary | ICD-10-CM

## 2015-05-06 NOTE — Telephone Encounter (Signed)
I briefly spoke with Hannah Frazier to introduce myself and to make her survivorship appt, as requested by Dr. Alvy Bimler.  Unfortunately,  Ms. Benesch was too busy to talk now and stated that she will call me back when she is free.  I gave her my direct office number and look forward to speaking with her soon.  Mike Craze, NP [place] [phone]

## 2015-05-06 NOTE — Telephone Encounter (Signed)
I shared with Hannah Frazier the purpose, goals of survivorship, and my role in her future care.  She is very excited about being a part of the survivorship program and I look forward to seeing her next year, as requested by Dr. Alvy Bimler.   We have scheduled her appt to see me for Thurs. 04/08/16 with labs at 2:00 pm and visit with me at 2:30 pm.  She understands that she does not need any additional imaging (CT scans).   I have mailed her a letter, along with a copy of her appt calendar, as well as my business card and a "[facility] for Every Survivor" brochure.  I encouraged her to call me with any questions or concerns before her next appt here and I would be happy to see her sooner, if needed.  She voiced understanding and appreciation.    Mike Craze, NP [place] [phone]

## 2015-07-25 DIAGNOSIS — H9201 Otalgia, right ear: Secondary | ICD-10-CM | POA: Insufficient documentation

## 2015-07-25 DIAGNOSIS — L299 Pruritus, unspecified: Secondary | ICD-10-CM | POA: Insufficient documentation

## 2015-07-25 DIAGNOSIS — J3489 Other specified disorders of nose and nasal sinuses: Secondary | ICD-10-CM | POA: Insufficient documentation

## 2015-10-06 ENCOUNTER — Other Ambulatory Visit: Payer: Self-pay | Admitting: Gynecology

## 2015-10-06 ENCOUNTER — Ambulatory Visit
Admission: RE | Admit: 2015-10-06 | Discharge: 2015-10-06 | Disposition: A | Discharge status: Home / Self Care | Payer: Managed Care, Other (non HMO) | Source: Ambulatory Visit | Attending: Gynecology | Admitting: Gynecology

## 2015-10-06 DIAGNOSIS — N644 Mastodynia: Secondary | ICD-10-CM

## 2015-10-20 ENCOUNTER — Other Ambulatory Visit: Payer: Self-pay | Admitting: Nurse Practitioner

## 2015-10-20 DIAGNOSIS — R413 Other amnesia: Secondary | ICD-10-CM

## 2015-10-20 DIAGNOSIS — R55 Syncope and collapse: Secondary | ICD-10-CM

## 2015-10-21 ENCOUNTER — Inpatient Hospital Stay: Admission: RE | Admit: 2015-10-21 | Payer: Managed Care, Other (non HMO) | Source: Ambulatory Visit

## 2015-10-23 ENCOUNTER — Encounter: Payer: Self-pay | Admitting: Neurology

## 2015-10-23 ENCOUNTER — Ambulatory Visit (HOSPITAL_COMMUNITY)
Admission: RE | Admit: 2015-10-23 | Discharge: 2015-10-23 | Disposition: A | Discharge status: Home / Self Care | Payer: Managed Care, Other (non HMO) | Source: Ambulatory Visit | Attending: Neurology | Admitting: Neurology

## 2015-10-23 ENCOUNTER — Ambulatory Visit (INDEPENDENT_AMBULATORY_CARE_PROVIDER_SITE_OTHER): Payer: Managed Care, Other (non HMO) | Admitting: Neurology

## 2015-10-23 VITALS — BP 132/88 | HR 86 | Resp 16 | Ht 66.0 in | Wt 201.5 lb

## 2015-10-23 DIAGNOSIS — Z8572 Personal history of non-Hodgkin lymphomas: Secondary | ICD-10-CM | POA: Diagnosis not present

## 2015-10-23 DIAGNOSIS — R404 Transient alteration of awareness: Secondary | ICD-10-CM | POA: Insufficient documentation

## 2015-10-23 DIAGNOSIS — R42 Dizziness and giddiness: Secondary | ICD-10-CM | POA: Insufficient documentation

## 2015-10-23 MED ORDER — GADOBENATE DIMEGLUMINE 529 MG/ML IV SOLN
19.0000 mL | Freq: Once | INTRAVENOUS | Status: AC | PRN
  Administered 2015-10-23: 19 mL via INTRAVENOUS

## 2015-10-23 NOTE — Progress Notes (Signed)
[facility]  PATIENT: Hannah Frazier DOB: 07/11/1950  REFERRING DOCTOR OR PCP:  Milagros Evener, MD SOURCE: Patient, notes from PCP,  _________________________________   HISTORICAL  CHIEF COMPLAINT:  Chief Complaint  Patient presents with  . Visual Disturbance    Hannah Frazier is here for eval of intermittent blurry vision both eyes, onset months ago. Ss. she had a hx. of migraines years ago, that resolved after partial hysterectomy in her thirties. She also c/o brief episodes of altered loc over the last week--once at work she was going to the bathroom, momentarily forgot what she was doing.  No fall or LOC.  Episode was unwitnessed. Sts. she was brushing her teeth and has no recollection of this.  Also sts. she has no recollection of putting laundry in the dryer.   . Altered Mental Status    One episode of dizziness.  She has an MRI sched. at [facility] in [place] tomorrow morning./fim    HISTORY OF PRESENT ILLNESS:  I had the pleasure seeing you patient, Hannah Frazier, at [facility] for neurologic consultation regarding her episodes and other neurologic symptoms.  For the past few months she has had intermittent episodes of blurry vision that would last just seconds. These are only occurring once or twice a month. She usually cannot associate the onset of symptoms with any activity. She also had an episode recently where she was in bed when she noted that there was a spinning of her vision, though she did not have a sensation of vertigo. This also just lasted a short period of time. No episodes were associated with loss of consciousness.  With most of the episodes, she is sitting at her desk. When she noted the spinning vision (episode earlier this week), she was laying in her bed.  Over the past week, she is also had 3 episodes where she felt that she altered level of awareness, probably for just seconds at a time. She notes that there  was no definite loss of consciousness and she was on her feet during all of the episodes and did not stagger. As an example, the one episode occurred while she was brushing her teeth and she did not recall the entire episode. Another one occurred where she had put laundry into the dryer without remembering it. There is no grogginess after she recovers.  Since earlier this week, about the time that she had the most recent episode, she has noted a low-grade headache with a tight sensation in her neck. She does not note any decreased range of motion in her neck. She does not note any numbness or weakness in the arms or legs. However, recently she did have an episode of weakness and tingling in the right arm.  She denies any change in her gait or her bladder function.     She has an MRI brain scheduled this week.    She reports poor sleep in general but no change in pattern x many years.    She has difficulty with both sleep onset and sleep maintenance.   She has a h/o non-Hodgkin's B cell lymphoma, treated with chemotherapy in 2010-2011.   There has not been any re-occurrence.  REVIEW OF SYSTEMS: Constitutional: No fevers, chills, sweats, or change in appetite.    Chronic insomnia Eyes: No visual changes, double vision, eye pain Ear, nose and throat: No hearing loss, ear pain, nasal congestion, sore throat Cardiovascular: No chest pain, palpitations Respiratory: No shortness of breath at  rest or with exertion.   No wheezes GastrointestinaI: H/o lymphoma in ileum.   No nausea, vomiting, diarrhea, abdominal pain, fecal incontinence Genitourinary: No dysuria, urinary retention or frequency.  No nocturia. Musculoskeletal: No neck pain, back pain Integumentary: No rash, pruritus, skin lesions Neurological: as above Psychiatric: No depression at this time.  Since cancer, she has had more anxiety.  Endocrine: No palpitations, diaphoresis, change in appetite, change in weigh or increased  thirst Hematologic/Lymphatic: H/o B cell lymphoma. Allergic/Immunologic: No itchy/runny eyes, nasal congestion, recent allergic reactions, rashes  ALLERGIES: Allergies  Allergen Reactions  . Simvastatin Swelling    Tongue swelling   . Lisinopril Nausea Only    HOME MEDICATIONS:  Current Outpatient Prescriptions:  .  aspirin 81 MG tablet, Take 81 mg by mouth every evening. , Disp: , Rfl:  .  aspirin-acetaminophen-caffeine (EXCEDRIN MIGRAINE) 250-250-65 MG per tablet, Take 1 tablet by mouth every 6 (six) hours as needed for pain (for headache)., Disp: , Rfl:  .  Blood Glucose Monitoring Suppl (ONE TOUCH ULTRA MINI) W/DEVICE KIT, , Disp: , Rfl:  .  hydrochlorothiazide (HYDRODIURIL) 25 MG tablet, Take 25 mg by mouth every morning. , Disp: , Rfl:  .  LORazepam (ATIVAN) 1 MG tablet, Take 0.5 mg by mouth at bedtime as needed (for sleep). , Disp: , Rfl:  .  ONE TOUCH ULTRA TEST test strip, , Disp: , Rfl:  .  ONETOUCH DELICA LANCETS 84X MISC, , Disp: , Rfl:  .  zolpidem (AMBIEN) 10 MG tablet, Take 5-10 mg by mouth at bedtime as needed for sleep. , Disp: , Rfl:   PAST MEDICAL HISTORY: Past Medical History:  Diagnosis Date  . Anxiety 02/08/2011  . Diabetes mellitus   . Headache   . History of B-cell lymphoma 02/08/2011   Extranodal: mass in terminal ileum presenting with abdominal pain April, 2011  . Hypertension   . 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 Vision abnormalities     PAST SURGICAL HISTORY: Past Surgical History:  Procedure Laterality Date  . ABDOMINAL HYSTERECTOMY    . COLECTOMY      FAMILY HISTORY: Family History  Problem Relation Age of Onset  . Cancer Mother     cervical ca  . Parkinson's disease Mother   . Heart disease Father     SOCIAL HISTORY:  Social History   Social History  . Marital status: Divorced    Spouse name: N/A  . Number of children: N/A  . Years of education: N/A   Occupational History  . Not on file.   Social History Main  Topics  . Smoking status: Never Smoker  . Smokeless tobacco: Never Used  . Alcohol use No  . Drug use: No  . Sexual activity: Not on file   Other Topics Concern  . Not on file   Social History Narrative  . No narrative on file     PHYSICAL EXAM  Vitals:   10/23/15 1314  BP: 132/88  Pulse: 86  Resp: 16  Weight: 201 lb 8 oz (91.4 kg)  Height: _0  (1.676 m)    Body mass index is 32.52 kg/m.   General: The patient is well-developed and well-nourished and in no acute distress  Eyes:  Funduscopic exam shows normal optic discs and retinal vessels.  Neck: The neck is supple, no carotid bruits are noted.  The neck is nontender.  Cardiovascular: The heart has a regular rate and rhythm with a normal S1 and S2.  There were no murmurs, gallops or rubs. Lungs are clear to auscultation.  Skin: Extremities are without significant edema.  Musculoskeletal:  Back is nontender  Neurologic Exam  Mental status: The patient is alert and oriented x 3 at the time of the examination. The patient has apparent normal recent and remote memory, with an apparently normal attention span and concentration ability.   Speech is normal.  Cranial nerves: Extraocular movements are full. Pupils are equal, round, and reactive to light and accomodation.  Visual fields are full.  Facial symmetry is present. There is good facial sensation to soft touch bilaterally.Facial strength is normal.  Trapezius and sternocleidomastoid strength is normal. No dysarthria is noted.  The tongue is midline, and the patient has symmetric elevation of the soft palate. No obvious hearing deficits are noted.  Motor:  Muscle bulk is normal.   Tone is normal. Strength is  5 / 5 in all 4 extremities.   Sensory: Sensory testing is intact to pinprick, soft touch and vibration sensation in all 4 extremities.  Coordination: Cerebellar testing reveals good finger-nose-finger and heel-to-shin bilaterally.  Gait and station: Station  is normal.   Gait is normal. Tandem gait is normal. Romberg is negative.   Reflexes: Deep tendon reflexes are symmetric and normal bilaterally.   Plantar responses are flexor.    DIAGNOSTIC DATA (LABS, IMAGING, TESTING) - I reviewed patient records, labs, notes, testing and imaging myself where available.  Lab Results  Component Value Date   WBC 7.0 04/14/2015   HGB 15.0 04/14/2015   HCT 44.7 04/14/2015   MCV 86.1 04/14/2015   PLT 225 04/14/2015      Component Value Date/Time   NA 141 04/14/2015 0838   K 4.1 04/14/2015 0838   CL 95 (L) 04/29/2012 0229   CL 103 04/10/2012 0824   CO2 30 (H) 04/14/2015 0838   GLUCOSE 138 04/14/2015 0838   GLUCOSE 111 (H) 04/10/2012 0824   BUN 10.3 04/14/2015 0838   CREATININE 0.9 04/14/2015 0838   CALCIUM 9.4 04/14/2015 0838   PROT 7.7 04/14/2015 0838   ALBUMIN 3.7 04/14/2015 0838   AST 17 04/14/2015 0838   ALT 19 04/14/2015 0838   ALKPHOS 72 04/14/2015 0838   BILITOT 0.83 04/14/2015 0838   GFRNONAA 89 (L) 04/29/2012 0229   GFRAA >90 04/29/2012 0229       ASSESSMENT AND PLAN  History of B-cell lymphoma  Transient alteration of awareness - Plan: EEG adult  Vertigo    In summary, Hannah Frazier 65 year old woman who has had multiple symptoms of transient alteration of awareness episodes of vertigo. Some of the episodes but unassociated with headache. She has a history of B-cell lymphoma.   To rule out a pathologic process as lymphoma or, we will check an MRI of the brain.    Additionally we'll check an EEG to determine if the transient alteration of awareness is not due to seizure activity.   If seizure activity is seen, I would add an antiepileptic agent.   Further recommendations will be made if the MRI is abnormal.     She will return as needed if she has an increase in the number of episodes or increased severity.  Thank you for asking me to see Hannah Frazier. Please let me know if I can be of further assistance with her or  other patients in the future.     Hannah Frazier A. Felecia Shelling, MD, PhD 5/78/4696, 2:95 PM Certified in Neurology, Clinical Neurophysiology, Sleep Medicine, Pain Medicine  and Neuroimaging  [facility] [address] [place], [place] [address] [phone]

## 2015-10-24 ENCOUNTER — Telehealth: Payer: Self-pay | Admitting: Neurology

## 2015-10-24 ENCOUNTER — Ambulatory Visit (HOSPITAL_COMMUNITY)
Admission: RE | Admit: 2015-10-24 | Discharge: 2015-10-24 | Disposition: A | Discharge status: Home / Self Care | Payer: Managed Care, Other (non HMO) | Source: Ambulatory Visit | Attending: Neurology | Admitting: Neurology

## 2015-10-24 DIAGNOSIS — R404 Transient alteration of awareness: Secondary | ICD-10-CM

## 2015-10-24 NOTE — Telephone Encounter (Signed)
EEG IS SCHEDULED AT [facility] 10/24/15 DUE TO PT DED STARTING OVER 10/26/15.   CB

## 2015-10-24 NOTE — Progress Notes (Signed)
OP routine EEG completed, results pending.

## 2015-10-24 NOTE — Telephone Encounter (Signed)
error 

## 2015-10-24 NOTE — Procedures (Signed)
Electroencephalogram (EEG) Report  Date of study: 10/24/15  Requesting clinician: Arlice Colt, MD  Reason for study: evaluate for possible seizure  Brief clinical history: 68-yo woman with an episode of transient alteration in awareness and vertigo. EEG requested for further evaluation.   Medications:  Current Outpatient Prescriptions:  .  aspirin 81 MG tablet, Take 81 mg by mouth every evening. , Disp: , Rfl:  .  aspirin-acetaminophen-caffeine (EXCEDRIN MIGRAINE) 250-250-65 MG per tablet, Take 1 tablet by mouth every 6 (six) hours as needed for pain (for headache)., Disp: , Rfl:  .  Blood Glucose Monitoring Suppl (ONE TOUCH ULTRA MINI) W/DEVICE KIT, , Disp: , Rfl:  .  hydrochlorothiazide (HYDRODIURIL) 25 MG tablet, Take 25 mg by mouth every morning. , Disp: , Rfl:  .  LORazepam (ATIVAN) 1 MG tablet, Take 0.5 mg by mouth at bedtime as needed (for sleep). , Disp: , Rfl:  .  ONE TOUCH ULTRA TEST test strip, , Disp: , Rfl:  .  ONETOUCH DELICA LANCETS 17G MISC, , Disp: , Rfl:  .  zolpidem (AMBIEN) 10 MG tablet, Take 5-10 mg by mouth at bedtime as needed for sleep. , Disp: , Rfl:   Description: This is a routine EEG performed using standard international 10-20 electrode placement. A total of 18 channels are recorded, including one for the EKG.  Activating Maneuvers: photic stimulation and hyperventilation  Findings:  The EKG channel demonstrates a regular rhythm with a rate of 70 beats per minute.   The background consists of well-formed alpha activity with an average dominant posterior rhythm of 12 Hz. This is symmetric and reacts as expected with eye opening. There is a mild degree of intermixed beta.   There are no focal asymmetries. No epileptiform discharges are present. No seizures are recorded.   Drowsiness is recorded and is normal in appearance.   Impression: This is a normal awake and drowsy EEG. The presence of beta activity is associated most commonly with anxiety and the  use of benzodiazepines. Clinical correlation is required.    Melba Coon, MD [facility]

## 2015-10-27 ENCOUNTER — Telehealth: Payer: Self-pay | Admitting: *Deleted

## 2015-10-27 NOTE — Telephone Encounter (Signed)
-----   Message from Britt Bottom, MD sent at 10/27/2015  1:03 PM EDT ----- Please let her know that the EEG is normal.

## 2015-10-27 NOTE — Telephone Encounter (Signed)
-----   Message from Britt Bottom, MD sent at 10/24/2015 12:08 PM EDT ----- Please let her know that the MRI of the brain is normal for her age.

## 2015-10-27 NOTE — Telephone Encounter (Signed)
I have spoken with Hannah Frazier this afternoon, and per RAS, advised that EEG was normal.  She verbalized understanding of same/fim

## 2015-10-27 NOTE — Telephone Encounter (Signed)
I have spoken with Hannah Frazier this afternoon, and pr RAS, advised that MRI brain is normal for age.  She verbalized understanding of same/fim

## 2016-04-07 ENCOUNTER — Other Ambulatory Visit: Payer: Self-pay | Admitting: Adult Health

## 2016-04-07 DIAGNOSIS — Z8572 Personal history of non-Hodgkin lymphomas: Secondary | ICD-10-CM

## 2016-04-08 ENCOUNTER — Other Ambulatory Visit: Payer: Managed Care, Other (non HMO)

## 2016-04-08 ENCOUNTER — Encounter: Payer: Managed Care, Other (non HMO) | Admitting: Adult Health

## 2016-04-08 ENCOUNTER — Ambulatory Visit (HOSPITAL_BASED_OUTPATIENT_CLINIC_OR_DEPARTMENT_OTHER): Payer: Managed Care, Other (non HMO) | Admitting: Adult Health

## 2016-04-08 ENCOUNTER — Encounter: Payer: Self-pay | Admitting: Adult Health

## 2016-04-08 ENCOUNTER — Other Ambulatory Visit (HOSPITAL_BASED_OUTPATIENT_CLINIC_OR_DEPARTMENT_OTHER): Payer: Managed Care, Other (non HMO)

## 2016-04-08 VITALS — BP 124/75 | HR 62 | Temp 97.8°F | Resp 17 | Ht 66.0 in | Wt 200.5 lb

## 2016-04-08 DIAGNOSIS — Z8572 Personal history of non-Hodgkin lymphomas: Secondary | ICD-10-CM | POA: Diagnosis not present

## 2016-04-08 LAB — CBC WITH DIFFERENTIAL/PLATELET
BASO%: 1.4 % (ref 0.0–2.0)
Basophils Absolute: 0.1 10*3/uL (ref 0.0–0.1)
EOS%: 3.1 % (ref 0.0–7.0)
Eosinophils Absolute: 0.2 10*3/uL (ref 0.0–0.5)
HCT: 44.6 % (ref 34.8–46.6)
HGB: 15.2 g/dL (ref 11.6–15.9)
LYMPH%: 34.7 % (ref 14.0–49.7)
MCH: 29.5 pg (ref 25.1–34.0)
MCHC: 33.9 g/dL (ref 31.5–36.0)
MCV: 86.8 fL (ref 79.5–101.0)
MONO#: 0.5 10*3/uL (ref 0.1–0.9)
MONO%: 7.4 % (ref 0.0–14.0)
NEUT#: 3.6 10*3/uL (ref 1.5–6.5)
NEUT%: 53.4 % (ref 38.4–76.8)
Platelets: 267 10*3/uL (ref 145–400)
RBC: 5.14 10*6/uL (ref 3.70–5.45)
RDW: 13 % (ref 11.2–14.5)
WBC: 6.8 10*3/uL (ref 3.9–10.3)
lymph#: 2.4 10*3/uL (ref 0.9–3.3)

## 2016-04-08 LAB — COMPREHENSIVE METABOLIC PANEL
ALT: 23 U/L (ref 0–55)
AST: 22 U/L (ref 5–34)
Albumin: 4 g/dL (ref 3.5–5.0)
Alkaline Phosphatase: 79 U/L (ref 40–150)
Anion Gap: 9 mEq/L (ref 3–11)
BUN: 12.8 mg/dL (ref 7.0–26.0)
CO2: 31 mEq/L — ABNORMAL HIGH (ref 22–29)
Calcium: 9.8 mg/dL (ref 8.4–10.4)
Chloride: 99 mEq/L (ref 98–109)
Creatinine: 0.9 mg/dL (ref 0.6–1.1)
EGFR: 65 mL/min/{1.73_m2} — ABNORMAL LOW (ref >90)
Glucose: 111 mg/dl (ref 70–140)
Potassium: 3.7 mEq/L (ref 3.5–5.1)
Sodium: 139 mEq/L (ref 136–145)
Total Bilirubin: 1.23 mg/dL — ABNORMAL HIGH (ref 0.20–1.20)
Total Protein: 8 g/dL (ref 6.4–8.3)

## 2016-04-08 LAB — LACTATE DEHYDROGENASE: LDH: 185 U/L (ref 125–245)

## 2016-04-08 NOTE — Progress Notes (Signed)
CLINIC:  Survivorship   REASON FOR VISIT:  Routine follow-up for history of lymphoma.   BRIEF ONCOLOGIC HISTORY:  Oncology History   Lymphoma, high grade involving the terminal ileum   Primary site: Lymphoid Neoplasms   Staging method: AJCC 6th Edition   Clinical: Stage I signed by Heath Lark, MD on 04/23/2013  1:17 PM   Pathologic: Stage I signed by Heath Lark, MD on 04/23/2013  1:17 PM   Summary: Stage I       History of B-cell lymphoma   04/28/2009 Procedure    Colonoscopy and biopsy show possible lymphoma involving the terminal ileum      05/08/2009 Bone Marrow Biopsy    Bone marrow biopsy was negative.      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      06/06/2009 - 09/22/2009 Chemotherapy    The patient completed 6 cycles of R. CHOP chemotherapy.      06/12/2009 Imaging    PET CT scan show no evidence of residual disease.      10/16/2009 Imaging    PET CT scan show no evidence of residual disease.      06/01/2012 Procedure    Colonoscopy showed no evidence of recurrence of disease.      10/24/2012 Imaging    CT scan show no evidence of recurrence of disease.      10/11/2013 Imaging    CT scan show no evidence of disease.      04/22/2015 Imaging    CT scan show no evidence of disease.       INTERVAL HISTORY:  [name] presents to the Survivorship Clinic today for routine follow-up for her history of lymphoma from 2011.  Overall, she reports feeling quite well. Her biggest issue is fear of recurrence.  She is socially withdrawn since her diagnosis.  She is very quick to go to the doctor and get checked for any issues, and asks for additional studies for her peace of mind.  She is very tearful when talking about this in clinic and about how shes scared the cancer may be coming back.  She has a benign soreness in her breast that has been chronic and stable  for the past 10 years.  She has kept up with mammograms.    REVIEW OF SYSTEMS:  Review of Systems  Constitutional: Negative for chills, diaphoresis, fever, malaise/fatigue and weight loss.  HENT: Negative for hearing loss and tinnitus.   Eyes: Negative for blurred vision and double vision.  Respiratory: Negative for cough and shortness of breath.   Cardiovascular: Negative for chest pain and palpitations.  Gastrointestinal: Negative for abdominal pain, blood in stool, constipation, diarrhea, heartburn, melena, nausea and vomiting.  Skin: Negative for rash.  Neurological: Negative for dizziness, tingling, weakness and headaches.    PAST MEDICAL/SURGICAL HISTORY:  Past Medical History:  Diagnosis Date  . Anxiety 02/08/2011  . Diabetes mellitus   . Headache   . History of B-cell lymphoma 02/08/2011   Extranodal: mass in terminal ileum presenting with abdominal pain April, 2011  . Hypertension   . 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 Vision abnormalities    Past Surgical History:  Procedure Laterality Date  . ABDOMINAL HYSTERECTOMY    . COLECTOMY       ALLERGIES:  Allergies  Allergen Reactions  . Simvastatin Swelling    Tongue swelling   .  Lisinopril Nausea Only     CURRENT MEDICATIONS:  Outpatient Encounter Prescriptions as of 04/08/2016  Medication Sig Note  . aspirin 81 MG tablet Take 81 mg by mouth every evening.    Marland Kitchen aspirin-acetaminophen-caffeine (EXCEDRIN MIGRAINE) 250-250-65 MG per tablet Take 1 tablet by mouth every 6 (six) hours as needed for pain (for headache).   . Blood Glucose Monitoring Suppl (ONE TOUCH ULTRA MINI) W/DEVICE KIT  10/15/2013: Received from: External Pharmacy  . hydrochlorothiazide (HYDRODIURIL) 25 MG tablet Take 25 mg by mouth every morning.    . loperamide (IMODIUM A-D) 2 MG tablet Take 2 mg by mouth as needed for diarrhea or loose stools (takes once a week).   . LORazepam (ATIVAN) 1 MG tablet Take 0.5 mg by mouth at bedtime as needed  (for sleep).    . ONE TOUCH ULTRA TEST test strip  10/15/2013: Received from: External Pharmacy  . ONETOUCH DELICA LANCETS 74B MISC  10/15/2013: Received from: External Pharmacy  . zolpidem (AMBIEN) 10 MG tablet Take 5-10 mg by mouth at bedtime as needed for sleep.  04/29/2012: .   No facility-administered encounter medications on file as of 04/08/2016.      ONCOLOGIC FAMILY HISTORY:  Family History  Problem Relation Age of Onset  . Cancer Mother     cervical ca  . Parkinson's disease Mother   . Heart disease Father       SOCIAL HISTORY:  Hannah Frazier is divorced x 20 years and lives with her exhusband for the past 36 years in [place], [place].  Ms. Hannah Frazier  has 2 children, her daughter lives in [place] and her son is living at home with her and her ex husband.  She works full time with hospice.      Denies any current or history of tobacco, alcohol, or illicit drug use.   PHYSICAL EXAMINATION:  Vital Signs: Vitals:   04/08/16 1135  BP: 124/75  Pulse: 62  Resp: 17  Temp: 97.8 F (36.6 C)   Filed Weights   04/08/16 1135  Weight: 200 lb 8 oz (90.9 kg)   General: Well-nourished, well-appearing female in no acute distress.Unaccompanied today. HEENT: Head is normocephalic.  Pupils equal and reactive to light. Conjunctivae clear without exudate.  Sclerae anicteric. Oral mucosa is pink, moist.  Oropharynx is pink without lesions or erythema.  Lymph: No cervical, supraclavicular, infraclavicular, or axillary lymphadenopathy noted on palpation.  Cardiovascular: Regular rate and rhythm.Marland Kitchen Respiratory: Clear to auscultation bilaterally. Chest expansion symmetric; breathing non-labored.  GI: Abdomen soft and round; non-tender, non-distended. Bowel sounds normoactive. No hepatosplenomegaly.   GU: Deferred.  Neuro: No focal deficits. Steady gait.  Psych: Mood and affect normal and appropriate for situation.  Extremities: No edema. Skin: Warm and dry.   LABORATORY DATA:  Appointment  on 04/08/2016  Component Date Value Ref Range Status  . WBC 04/08/2016 6.8  3.9 - 10.3 10e3/uL Final  . NEUT# 04/08/2016 3.6  1.5 - 6.5 10e3/uL Final  . HGB 04/08/2016 15.2  11.6 - 15.9 g/dL Final  . HCT 04/08/2016 44.6  34.8 - 46.6 % Final  . Platelets 04/08/2016 267  145 - 400 10e3/uL Final  . MCV 04/08/2016 86.8  79.5 - 101.0 fL Final  . MCH 04/08/2016 29.5  25.1 - 34.0 pg Final  . MCHC 04/08/2016 33.9  31.5 - 36.0 g/dL Final  . RBC 04/08/2016 5.14  3.70 - 5.45 10e6/uL Final  . RDW 04/08/2016 13.0  11.2 - 14.5 % Final  . lymph#  04/08/2016 2.4  0.9 - 3.3 10e3/uL Final  . MONO# 04/08/2016 0.5  0.1 - 0.9 10e3/uL Final  . Eosinophils Absolute 04/08/2016 0.2  0.0 - 0.5 10e3/uL Final  . Basophils Absolute 04/08/2016 0.1  0.0 - 0.1 10e3/uL Final  . NEUT% 04/08/2016 53.4  38.4 - 76.8 % Final  . LYMPH% 04/08/2016 34.7  14.0 - 49.7 % Final  . MONO% 04/08/2016 7.4  0.0 - 14.0 % Final  . EOS% 04/08/2016 3.1  0.0 - 7.0 % Final  . BASO% 04/08/2016 1.4  0.0 - 2.0 % Final  . Sodium 04/08/2016 139  136 - 145 mEq/L Final  . Potassium 04/08/2016 3.7  3.5 - 5.1 mEq/L Final  . Chloride 04/08/2016 99  98 - 109 mEq/L Final  . CO2 04/08/2016 31* 22 - 29 mEq/L Final  . Glucose 04/08/2016 111  70 - 140 mg/dl Final  . BUN 04/08/2016 12.8  7.0 - 26.0 mg/dL Final  . Creatinine 04/08/2016 0.9  0.6 - 1.1 mg/dL Final  . Total Bilirubin 04/08/2016 1.23* 0.20 - 1.20 mg/dL Final  . Alkaline Phosphatase 04/08/2016 79  40 - 150 U/L Final  . AST 04/08/2016 22  5 - 34 U/L Final  . ALT 04/08/2016 23  0 - 55 U/L Final  . Total Protein 04/08/2016 8.0  6.4 - 8.3 g/dL Final  . Albumin 04/08/2016 4.0  3.5 - 5.0 g/dL Final  . Calcium 04/08/2016 9.8  8.4 - 10.4 mg/dL Final  . Anion Gap 04/08/2016 9  3 - 11 mEq/L Final  . EGFR 04/08/2016 65* >90 ml/min/1.73 m2 Final  . LDH 04/08/2016 185  125 - 245 U/L Final    DIAGNOSTIC IMAGING:  None for this visit     ASSESSMENT AND PLAN:  Ms.. Frazier is a pleasant 66 y.o.   Woman with stage 1 lymphoma , diagnosed in 2011; treated with R-CHOP chemotherapy. HannahJamerson presents to the Survivorship Clinic for surveillance and routine follow-up.   1. History of Stage 1 cancer:  Ms. Bau is currently clinically and radiographically without evidence of disease or recurrence of lymphoma. She will follow-up in the Survivorship Clinic in 1 year with labs, history, and physical exam per surveillance protocol.  I encouraged her to call me with any questions or concerns before her next visit at the cancer center, and I would be happy to see the patient sooner, if needed.    2.  Fear of recurrence/PTSD:  We spent a great deal of time discussing this in detail.  She was very tearful when talking about this and her anxiety about recurrence.  We discussed how counseling and cognitive behavior therapies would be helpful for her to reset her thoughts and feelings about symptoms that she may experience.  I gave her information on counseling services provided by [facility] and she has assured me that she will be getting in touch with them.  We did talk about starting medication such as zoloft, however she will work with counseling first.    3. Cancer screening:  Due to Ms. Yoss's history and age, she should receive screening for skin cancers, breast cancer, colon cancer, and gynecologic cancers. The patient was encouraged to follow-up with her PCP for appropriate cancer screenings.   4. Health maintenance and wellness promotion: Ms. Dowlen was encouraged to consume 5-7 servings of fruits and vegetables per day. The patient was also encouraged to engage in moderate to vigorous exercise for 30 minutes per day most days of the week. Ms. Rabalais  was instructed to limit her alcohol consumption and continue to abstain from tobacco use/was encouraged stop smoking.      Dispo:  -Return to cancer center to see Survivorship NP in one year   A total of (30) minutes of face-to-face time was spent  with this patient with greater than 50% of that time in counseling and care-coordination.   Gardenia Phlegm, NP Survivorship Program [facility] [phone]   Note: PRIMARY CARE PROVIDER Aretta Nip, [place] [phone]

## 2016-09-08 ENCOUNTER — Other Ambulatory Visit: Payer: Self-pay | Admitting: Family Medicine

## 2016-09-08 DIAGNOSIS — Z1231 Encounter for screening mammogram for malignant neoplasm of breast: Secondary | ICD-10-CM

## 2016-10-06 ENCOUNTER — Ambulatory Visit
Admission: RE | Admit: 2016-10-06 | Discharge: 2016-10-06 | Disposition: A | Discharge status: Home / Self Care | Payer: Managed Care, Other (non HMO) | Source: Ambulatory Visit | Attending: Family Medicine | Admitting: Family Medicine

## 2016-10-06 DIAGNOSIS — Z1231 Encounter for screening mammogram for malignant neoplasm of breast: Secondary | ICD-10-CM

## 2016-10-08 ENCOUNTER — Ambulatory Visit: Payer: Managed Care, Other (non HMO)

## 2017-03-01 DIAGNOSIS — M5033 Other cervical disc degeneration, cervicothoracic region: Secondary | ICD-10-CM | POA: Diagnosis not present

## 2017-03-01 DIAGNOSIS — M9901 Segmental and somatic dysfunction of cervical region: Secondary | ICD-10-CM | POA: Diagnosis not present

## 2017-03-04 DIAGNOSIS — M5033 Other cervical disc degeneration, cervicothoracic region: Secondary | ICD-10-CM | POA: Diagnosis not present

## 2017-03-04 DIAGNOSIS — M9901 Segmental and somatic dysfunction of cervical region: Secondary | ICD-10-CM | POA: Diagnosis not present

## 2017-03-10 ENCOUNTER — Other Ambulatory Visit: Payer: Self-pay

## 2017-03-10 NOTE — Patient Outreach (Signed)
[facility]) Care Management  03/10/2017  Hannah Frazier 1950/12/14 [id number]   Telephone call to review health risk assessment and screen for care management needs for  [employer]. No answer and message left.   Plan to send unsuccessful outreach letter and will attempt #3 outreach call on 03/23/17 if message not returned.Peter Garter RN, [facility] Care Management Coordinator [facility] Care Management [phone]

## 2017-03-16 DIAGNOSIS — M9901 Segmental and somatic dysfunction of cervical region: Secondary | ICD-10-CM | POA: Diagnosis not present

## 2017-03-16 DIAGNOSIS — M5033 Other cervical disc degeneration, cervicothoracic region: Secondary | ICD-10-CM | POA: Diagnosis not present

## 2017-03-23 ENCOUNTER — Other Ambulatory Visit: Payer: Managed Care, Other (non HMO)

## 2017-03-23 NOTE — Patient Outreach (Signed)
[facility]) Care Management  03/23/2017  Hannah Frazier 15-Apr-1950 [id number]   Telephone call to review health risk assessment and screen for care management needs for  [employer].  3rd attempt. No response from outreach letter sent on 03/10/17 No answer and message left.   Plan to close.  Unable to contact. Peter Garter RN, [facility] Care Management Coordinator [facility] Care Management [phone]

## 2017-04-08 ENCOUNTER — Other Ambulatory Visit: Payer: Managed Care, Other (non HMO)

## 2017-04-08 ENCOUNTER — Encounter: Payer: Managed Care, Other (non HMO) | Admitting: Adult Health

## 2017-04-14 ENCOUNTER — Other Ambulatory Visit: Payer: Self-pay | Admitting: Adult Health

## 2017-04-14 DIAGNOSIS — Z8572 Personal history of non-Hodgkin lymphomas: Secondary | ICD-10-CM

## 2017-04-15 ENCOUNTER — Inpatient Hospital Stay: Payer: PPO | Attending: Adult Health | Admitting: Adult Health

## 2017-04-15 ENCOUNTER — Inpatient Hospital Stay: Payer: PPO

## 2017-04-15 ENCOUNTER — Telehealth: Payer: Self-pay | Admitting: Adult Health

## 2017-04-15 ENCOUNTER — Encounter: Payer: Self-pay | Admitting: Adult Health

## 2017-04-15 VITALS — BP 146/90 | HR 76 | Temp 98.3°F | Resp 18 | Ht 66.0 in | Wt 200.1 lb

## 2017-04-15 DIAGNOSIS — I10 Essential (primary) hypertension: Secondary | ICD-10-CM | POA: Insufficient documentation

## 2017-04-15 DIAGNOSIS — Z8572 Personal history of non-Hodgkin lymphomas: Secondary | ICD-10-CM | POA: Insufficient documentation

## 2017-04-15 DIAGNOSIS — E119 Type 2 diabetes mellitus without complications: Secondary | ICD-10-CM | POA: Diagnosis not present

## 2017-04-15 DIAGNOSIS — Z888 Allergy status to other drugs, medicaments and biological substances status: Secondary | ICD-10-CM | POA: Diagnosis not present

## 2017-04-15 DIAGNOSIS — Z79899 Other long term (current) drug therapy: Secondary | ICD-10-CM

## 2017-04-15 DIAGNOSIS — R1011 Right upper quadrant pain: Secondary | ICD-10-CM | POA: Diagnosis not present

## 2017-04-15 DIAGNOSIS — Z7982 Long term (current) use of aspirin: Secondary | ICD-10-CM | POA: Insufficient documentation

## 2017-04-15 DIAGNOSIS — F4310 Post-traumatic stress disorder, unspecified: Secondary | ICD-10-CM | POA: Diagnosis not present

## 2017-04-15 DIAGNOSIS — F419 Anxiety disorder, unspecified: Secondary | ICD-10-CM | POA: Diagnosis not present

## 2017-04-15 LAB — CBC WITH DIFFERENTIAL (CANCER CENTER ONLY)
Basophils Absolute: 0.1 10*3/uL (ref 0.0–0.1)
Basophils Relative: 1 %
Eosinophils Absolute: 0.2 10*3/uL (ref 0.0–0.5)
Eosinophils Relative: 3 %
HCT: 44.1 % (ref 34.8–46.6)
Hemoglobin: 14.8 g/dL (ref 11.6–15.9)
Lymphocytes Relative: 41 %
Lymphs Abs: 2.6 10*3/uL (ref 0.9–3.3)
MCH: 28.7 pg (ref 25.1–34.0)
MCHC: 33.6 g/dL (ref 31.5–36.0)
MCV: 85.5 fL (ref 79.5–101.0)
Monocytes Absolute: 0.4 10*3/uL (ref 0.1–0.9)
Monocytes Relative: 6 %
Neutro Abs: 3.1 10*3/uL (ref 1.5–6.5)
Neutrophils Relative %: 49 %
Platelet Count: 265 10*3/uL (ref 145–400)
RBC: 5.16 MIL/uL (ref 3.70–5.45)
RDW: 13.2 % (ref 11.2–14.5)
WBC Count: 6.3 10*3/uL (ref 3.9–10.3)

## 2017-04-15 LAB — CMP (CANCER CENTER ONLY)
ALT: 14 U/L (ref 0–55)
AST: 16 U/L (ref 5–34)
Albumin: 3.9 g/dL (ref 3.5–5.0)
Alkaline Phosphatase: 89 U/L (ref 40–150)
Anion gap: 10 (ref 3–11)
BUN: 9 mg/dL (ref 7–26)
CO2: 29 mmol/L (ref 22–29)
Calcium: 9.6 mg/dL (ref 8.4–10.4)
Chloride: 100 mmol/L (ref 98–109)
Creatinine: 0.91 mg/dL (ref 0.60–1.10)
GFR, Est AFR Am: >60 mL/min (ref >60)
GFR, Estimated: >60 mL/min (ref >60)
Glucose, Bld: 127 mg/dL (ref 70–140)
Potassium: 3.8 mmol/L (ref 3.5–5.1)
Sodium: 139 mmol/L (ref 136–145)
Total Bilirubin: 1.1 mg/dL (ref 0.2–1.2)
Total Protein: 7.9 g/dL (ref 6.4–8.3)

## 2017-04-15 LAB — LACTATE DEHYDROGENASE: LDH: 175 U/L (ref 125–245)

## 2017-04-15 NOTE — Telephone Encounter (Signed)
Patient declined AVs and calendar of upcoming march 2020 appointments.

## 2017-04-15 NOTE — Progress Notes (Signed)
CLINIC:  Survivorship   REASON FOR VISIT:  Routine follow-up for history of lymphoma.   BRIEF ONCOLOGIC HISTORY:  Oncology History   Lymphoma, high grade involving the terminal ileum   Primary site: Lymphoid Neoplasms   Staging method: AJCC 6th Edition   Clinical: Stage I signed by Heath Lark, MD on 04/23/2013  1:17 PM   Pathologic: Stage I signed by Heath Lark, MD on 04/23/2013  1:17 PM   Summary: Stage I       History of B-cell lymphoma   04/28/2009 Procedure    Colonoscopy and biopsy show possible lymphoma involving the terminal ileum      05/08/2009 Bone Marrow Biopsy    Bone marrow biopsy was negative.      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      06/06/2009 - 09/22/2009 Chemotherapy    The patient completed 6 cycles of R. CHOP chemotherapy.      06/12/2009 Imaging    PET CT scan show no evidence of residual disease.      10/16/2009 Imaging    PET CT scan show no evidence of residual disease.      06/01/2012 Procedure    Colonoscopy showed no evidence of recurrence of disease.      10/24/2012 Imaging    CT scan show no evidence of recurrence of disease.      10/11/2013 Imaging    CT scan show no evidence of disease.      04/22/2015 Imaging    CT scan show no evidence of disease.       INTERVAL HISTORY:  Hannah Frazier presents to the Survivorship Clinic today for routine follow-up for her history of lymphoma from 2011.  Overall, she reports feeling quite well. She has been having right upper quadrant pain with no specific pattern x 1-2 months.  One time this pain lasted for two days.  She says the pain is grabbing.  Last episode two weeks ago.  Last set of labs due today.     She sees her PCP regularly in September.  She is up to date with colon cancer screening.  Pap due next year.  No history of tobacco use.  She last had a skin check by dermatology  a couple of years ago.  Her PCP does this annually.    At her last visit, she was having difficulty with PTSD and fear of recurrence.  She and I discussed the possibility of pursuing professional counseling, cognitive behavior therapy, and/or medication.  She is much improved today, as compared to last year and feels like this is nearly resolved.      REVIEW OF SYSTEMS:  Review of Systems  Constitutional: Negative for appetite change, chills, fatigue, fever and unexpected weight change.  HENT:   Negative for hearing loss, lump/mass and trouble swallowing.   Eyes: Negative for eye problems and icterus.  Respiratory: Negative for chest tightness, cough and shortness of breath.   Cardiovascular: Negative for chest pain, leg swelling and palpitations.  Gastrointestinal: Positive for abdominal pain (see interval history). Negative for abdominal distention, blood in stool, constipation, diarrhea, nausea and vomiting.  Endocrine: Negative for hot flashes.  Musculoskeletal: Negative for arthralgias.  Skin: Negative for itching and rash.  Neurological: Negative for dizziness, extremity weakness and numbness.  Hematological: Negative for adenopathy. Does not bruise/bleed easily.  Psychiatric/Behavioral: Negative for depression. The patient is not nervous/anxious.  PAST MEDICAL/SURGICAL HISTORY:  Past Medical History:  Diagnosis Date  . Anxiety 02/08/2011  . Diabetes mellitus   . Headache   . History of B-cell lymphoma 02/08/2011   Extranodal: mass in terminal ileum presenting with abdominal pain April, 2011  . Hypertension   . 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 Vision abnormalities    Past Surgical History:  Procedure Laterality Date  . ABDOMINAL HYSTERECTOMY    . COLECTOMY       ALLERGIES:  Allergies  Allergen Reactions  . Simvastatin Swelling    Tongue swelling   . Lisinopril Nausea Only     CURRENT MEDICATIONS:  Outpatient Encounter Medications as of 04/15/2017    Medication Sig Note  . aspirin 81 MG tablet Take 81 mg by mouth every evening.    Marland Kitchen aspirin-acetaminophen-caffeine (EXCEDRIN MIGRAINE) 250-250-65 MG per tablet Take 1 tablet by mouth every 6 (six) hours as needed for pain (for headache).   . Blood Glucose Monitoring Suppl (ONE TOUCH ULTRA MINI) W/DEVICE KIT  10/15/2013: Received from: External Pharmacy  . hydrochlorothiazide (HYDRODIURIL) 25 MG tablet Take 25 mg by mouth every morning.    Marland Kitchen LORazepam (ATIVAN) 1 MG tablet Take 0.5 mg by mouth at bedtime as needed (for sleep).    . ONE TOUCH ULTRA TEST test strip  10/15/2013: Received from: External Pharmacy  . ONETOUCH DELICA LANCETS 55D MISC  10/15/2013: Received from: External Pharmacy  . zolpidem (AMBIEN) 10 MG tablet Take 5-10 mg by mouth at bedtime as needed for sleep.  04/29/2012: .  . loperamide (IMODIUM A-D) 2 MG tablet Take 2 mg by mouth as needed for diarrhea or loose stools (takes once a week).   . [DISCONTINUED] FLUoxetine (PROZAC) 20 MG tablet Take 20 mg by mouth daily.   . [DISCONTINUED] potassium chloride SA (K-DUR,KLOR-CON) 20 MEQ tablet Take 1 tablet (20 mEq total) by mouth 2 (two) times daily.    No facility-administered encounter medications on file as of 04/15/2017.      ONCOLOGIC FAMILY HISTORY:  Family History  Problem Relation Age of Onset  . Cancer Mother        cervical ca  . Parkinson's disease Mother   . Heart disease Father       SOCIAL HISTORY:  Social History   Socioeconomic History  . Marital status: Divorced    Spouse name: Not on file  . Number of children: Not on file  . Years of education: Not on file  . Highest education level: Not on file  Occupational History  . Not on file  Social Needs  . Financial resource strain: Not on file  . Food insecurity:    Worry: Not on file    Inability: Not on file  . Transportation needs:    Medical: Not on file    Non-medical: Not on file  Tobacco Use  . Smoking status: Never Smoker  . Smokeless  tobacco: Never Used  Substance and Sexual Activity  . Alcohol use: No  . Drug use: No  . Sexual activity: Not on file  Lifestyle  . Physical activity:    Days per week: Not on file    Minutes per session: Not on file  . Stress: Not on file  Relationships  . Social connections:    Talks on phone: Not on file    Gets together: Not on file    Attends religious service: Not on file    Active member of club or organization: Not  on file    Attends meetings of clubs or organizations: Not on file    Relationship status: Not on file  . Intimate partner violence:    Fear of current or ex partner: Not on file    Emotionally abused: Not on file    Physically abused: Not on file    Forced sexual activity: Not on file  Other Topics Concern  . Not on file  Social History Narrative  . Not on file      PHYSICAL EXAMINATION:  Vital Signs: Vitals:   04/15/17 1042  BP: (!) 146/90  Pulse: 76  Resp: 18  Temp: 98.3 F (36.8 C)  SpO2: 99%   Filed Weights   04/15/17 1042  Weight: 200 lb 1.6 oz (90.8 kg)   General: Well-nourished, well-appearing female in no acute distress.Unaccompanied today. HEENT: Head is normocephalic.  Pupils equal and reactive to light. Conjunctivae clear without exudate.  Sclerae anicteric. Oral mucosa is pink, moist.  Oropharynx is pink without lesions or erythema.  Lymph: No cervical, supraclavicular, infraclavicular, or axillary lymphadenopathy noted on palpation.  Cardiovascular: Regular rate and rhythm.Marland Kitchen Respiratory: Clear to auscultation bilaterally. Chest expansion symmetric; breathing non-labored.  GI: Abdomen soft and round; non-tender in left upper and lower quadrant, right lower quadrant + Murphy sign, non-distended. Bowel sounds normoactive. No hepatosplenomegaly.   GU: Deferred.  Neuro: No focal deficits. Steady gait.  Psych: Mood and affect normal and appropriate for situation.  Extremities: No edema. Skin: Warm and dry.   LABORATORY DATA:   Appointment on 04/15/2017  Component Date Value Ref Range Status  . WBC Count 04/15/2017 6.3  3.9 - 10.3 K/uL Final  . RBC 04/15/2017 5.16  3.70 - 5.45 MIL/uL Final  . Hemoglobin 04/15/2017 14.8  11.6 - 15.9 g/dL Final  . HCT 04/15/2017 44.1  34.8 - 46.6 % Final  . MCV 04/15/2017 85.5  79.5 - 101.0 fL Final  . MCH 04/15/2017 28.7  25.1 - 34.0 pg Final  . MCHC 04/15/2017 33.6  31.5 - 36.0 g/dL Final  . RDW 04/15/2017 13.2  11.2 - 14.5 % Final  . Platelet Count 04/15/2017 265  145 - 400 K/uL Final  . Neutrophils Relative % 04/15/2017 49  % Final  . Neutro Abs 04/15/2017 3.1  1.5 - 6.5 K/uL Final  . Lymphocytes Relative 04/15/2017 41  % Final  . Lymphs Abs 04/15/2017 2.6  0.9 - 3.3 K/uL Final  . Monocytes Relative 04/15/2017 6  % Final  . Monocytes Absolute 04/15/2017 0.4  0.1 - 0.9 K/uL Final  . Eosinophils Relative 04/15/2017 3  % Final  . Eosinophils Absolute 04/15/2017 0.2  0.0 - 0.5 K/uL Final  . Basophils Relative 04/15/2017 1  % Final  . Basophils Absolute 04/15/2017 0.1  0.0 - 0.1 K/uL Final   Performed at [facility], [place] [address], [place], [place] [address]    DIAGNOSTIC IMAGING:  None for this visit     ASSESSMENT AND PLAN:  Ms.. Frazier is a pleasant 67 y.o.  Woman with stage 1 lymphoma , diagnosed in 2011; treated with R-CHOP chemotherapy. Hannah Frazier presents to the Survivorship Clinic for surveillance and routine follow-up.   1. History of Stage 1 cancer:  Hannah Frazier is currently clinically and radiographically without evidence of disease or recurrence of lymphoma. She will follow-up in the Survivorship Clinic in 1 year with labs, history, and physical exam per surveillance protocol.  I encouraged her to call me with any questions or concerns  before her next visit at the cancer center, and I would be happy to see the patient sooner, if needed.    2.  Fear of recurrence/PTSD:  Last year when we met, we reviewed counseling, and discussed  medication options.  Today she is much improved and feels like this is near resolved.    3. RUQ pain: We will await labs from today's visit.  CBC, CMET, and LDH are pending.  I also ordered an abdominal ultrasound of her RUQ since she has + Murphy's sign.  If anything comes back regarding her gall bladder, her surgeon is Dr. Hassell Done, and she was happy with his care.    4. Cancer screening:  Due to Hannah Frazier's history and age, she should receive screening for skin cancers, breast cancer, colon cancer, and gynecologic cancers. The patient was encouraged to follow-up with her PCP for appropriate cancer screenings.   5. Health maintenance and wellness promotion: Hannah Frazier was encouraged to consume 5-7 servings of fruits and vegetables per day. The patient was also encouraged to engage in moderate to vigorous exercise for 30 minutes per day most days of the week. Hannah Frazier was instructed to limit her alcohol consumption and continue to abstain from tobacco use/was encouraged stop smoking.      Dispo:  -Return to cancer center to see Survivorship NP in one year -Abdominal ultrasound   A total of (30) minutes of face-to-face time was spent with this patient with greater than 50% of that time in counseling and care-coordination.   Gardenia Phlegm, NP Survivorship Program [facility] [phone]   Note: PRIMARY CARE PROVIDER Aretta Nip, [place] [phone]

## 2017-04-19 ENCOUNTER — Ambulatory Visit (HOSPITAL_COMMUNITY)
Admission: RE | Admit: 2017-04-19 | Discharge: 2017-04-19 | Disposition: A | Discharge status: Home / Self Care | Payer: PPO | Source: Ambulatory Visit | Attending: Adult Health | Admitting: Adult Health

## 2017-04-19 DIAGNOSIS — R1011 Right upper quadrant pain: Secondary | ICD-10-CM | POA: Insufficient documentation

## 2017-04-19 DIAGNOSIS — K760 Fatty (change of) liver, not elsewhere classified: Secondary | ICD-10-CM | POA: Diagnosis not present

## 2017-04-22 DIAGNOSIS — M542 Cervicalgia: Secondary | ICD-10-CM | POA: Diagnosis not present

## 2017-04-22 DIAGNOSIS — M1990 Unspecified osteoarthritis, unspecified site: Secondary | ICD-10-CM | POA: Diagnosis not present

## 2017-06-03 DIAGNOSIS — M1712 Unilateral primary osteoarthritis, left knee: Secondary | ICD-10-CM | POA: Diagnosis not present

## 2017-06-10 DIAGNOSIS — M1712 Unilateral primary osteoarthritis, left knee: Secondary | ICD-10-CM | POA: Diagnosis not present

## 2017-06-15 ENCOUNTER — Encounter: Payer: Self-pay | Admitting: Adult Health

## 2017-06-21 DIAGNOSIS — M25462 Effusion, left knee: Secondary | ICD-10-CM | POA: Diagnosis not present

## 2017-06-27 DIAGNOSIS — H60311 Diffuse otitis externa, right ear: Secondary | ICD-10-CM | POA: Diagnosis not present

## 2017-06-30 DIAGNOSIS — H60311 Diffuse otitis externa, right ear: Secondary | ICD-10-CM | POA: Diagnosis not present

## 2017-07-05 DIAGNOSIS — J383 Other diseases of vocal cords: Secondary | ICD-10-CM | POA: Diagnosis not present

## 2017-07-05 DIAGNOSIS — H6061 Unspecified chronic otitis externa, right ear: Secondary | ICD-10-CM | POA: Insufficient documentation

## 2017-07-05 DIAGNOSIS — R490 Dysphonia: Secondary | ICD-10-CM | POA: Insufficient documentation

## 2017-07-05 DIAGNOSIS — Z8572 Personal history of non-Hodgkin lymphomas: Secondary | ICD-10-CM | POA: Diagnosis not present

## 2017-07-05 DIAGNOSIS — H60391 Other infective otitis externa, right ear: Secondary | ICD-10-CM | POA: Diagnosis not present

## 2017-08-16 DIAGNOSIS — E119 Type 2 diabetes mellitus without complications: Secondary | ICD-10-CM | POA: Diagnosis not present

## 2017-08-16 DIAGNOSIS — R002 Palpitations: Secondary | ICD-10-CM | POA: Diagnosis not present

## 2017-08-16 DIAGNOSIS — I10 Essential (primary) hypertension: Secondary | ICD-10-CM | POA: Diagnosis not present

## 2017-08-16 DIAGNOSIS — E7800 Pure hypercholesterolemia, unspecified: Secondary | ICD-10-CM | POA: Diagnosis not present

## 2017-08-16 DIAGNOSIS — Z8572 Personal history of non-Hodgkin lymphomas: Secondary | ICD-10-CM | POA: Diagnosis not present

## 2017-08-24 DIAGNOSIS — M5134 Other intervertebral disc degeneration, thoracic region: Secondary | ICD-10-CM | POA: Diagnosis not present

## 2017-08-24 DIAGNOSIS — M9902 Segmental and somatic dysfunction of thoracic region: Secondary | ICD-10-CM | POA: Diagnosis not present

## 2017-08-29 ENCOUNTER — Other Ambulatory Visit: Payer: Self-pay | Admitting: Family Medicine

## 2017-08-29 DIAGNOSIS — Z1231 Encounter for screening mammogram for malignant neoplasm of breast: Secondary | ICD-10-CM

## 2017-08-30 DIAGNOSIS — M9902 Segmental and somatic dysfunction of thoracic region: Secondary | ICD-10-CM | POA: Diagnosis not present

## 2017-08-30 DIAGNOSIS — M5134 Other intervertebral disc degeneration, thoracic region: Secondary | ICD-10-CM | POA: Diagnosis not present

## 2017-09-13 ENCOUNTER — Other Ambulatory Visit: Payer: Self-pay | Admitting: *Deleted

## 2017-09-13 NOTE — Patient Outreach (Signed)
[place] [facility]) Care Management  09/12/2017  Hannah Frazier Jun 07, 1950 [id number]    HTA/HRA no needs  RN spoke with pt and confirmed identifiers and purpose for today's call. Discussed recent questionnaire and inquired on any needs. Pt states she does not have any needs at this time indicating she remains employed and able to manage her care. States her A1C related to her diabetes along with her HTN is "good" with issues related at this time.  RN informed pt that she will received a letter concerning today's conversation. Call will be closed with no needs to address at this time.  Raina Mina, RN Care Management Coordinator [place] [facility] [phone]

## 2017-10-07 ENCOUNTER — Ambulatory Visit
Admission: RE | Admit: 2017-10-07 | Discharge: 2017-10-07 | Disposition: A | Discharge status: Home / Self Care | Payer: PPO | Source: Ambulatory Visit | Attending: Family Medicine | Admitting: Family Medicine

## 2017-10-07 DIAGNOSIS — Z1231 Encounter for screening mammogram for malignant neoplasm of breast: Secondary | ICD-10-CM | POA: Diagnosis not present

## 2017-11-08 DIAGNOSIS — G479 Sleep disorder, unspecified: Secondary | ICD-10-CM | POA: Diagnosis not present

## 2017-11-08 DIAGNOSIS — Z23 Encounter for immunization: Secondary | ICD-10-CM | POA: Diagnosis not present

## 2017-11-08 DIAGNOSIS — Z8572 Personal history of non-Hodgkin lymphomas: Secondary | ICD-10-CM | POA: Diagnosis not present

## 2017-11-08 DIAGNOSIS — E7800 Pure hypercholesterolemia, unspecified: Secondary | ICD-10-CM | POA: Diagnosis not present

## 2017-11-08 DIAGNOSIS — E119 Type 2 diabetes mellitus without complications: Secondary | ICD-10-CM | POA: Diagnosis not present

## 2017-11-08 DIAGNOSIS — I10 Essential (primary) hypertension: Secondary | ICD-10-CM | POA: Diagnosis not present

## 2017-11-08 DIAGNOSIS — F418 Other specified anxiety disorders: Secondary | ICD-10-CM | POA: Diagnosis not present

## 2017-11-08 DIAGNOSIS — Z0000 Encounter for general adult medical examination without abnormal findings: Secondary | ICD-10-CM | POA: Diagnosis not present

## 2017-11-09 DIAGNOSIS — E119 Type 2 diabetes mellitus without complications: Secondary | ICD-10-CM | POA: Diagnosis not present

## 2017-11-09 DIAGNOSIS — E7800 Pure hypercholesterolemia, unspecified: Secondary | ICD-10-CM | POA: Diagnosis not present

## 2017-11-09 DIAGNOSIS — I10 Essential (primary) hypertension: Secondary | ICD-10-CM | POA: Diagnosis not present

## 2017-11-30 ENCOUNTER — Encounter: Payer: Self-pay | Admitting: Adult Health

## 2017-12-07 ENCOUNTER — Telehealth: Payer: Self-pay

## 2017-12-07 NOTE — Telephone Encounter (Signed)
Called patient regarding my chart message.  Patient has been having pains on right side in rib area that goes around to her back.  She says the area feels swollen and the pain has been increasing and does not like to take pain medicine.  Patient scheduled appt in [facility] with Lucianne Lei next week to have these concerns addressed.

## 2017-12-16 ENCOUNTER — Encounter: Payer: PPO | Admitting: Medical

## 2018-01-09 DIAGNOSIS — M5136 Other intervertebral disc degeneration, lumbar region: Secondary | ICD-10-CM | POA: Diagnosis not present

## 2018-01-09 DIAGNOSIS — M9903 Segmental and somatic dysfunction of lumbar region: Secondary | ICD-10-CM | POA: Diagnosis not present

## 2018-01-09 DIAGNOSIS — M9904 Segmental and somatic dysfunction of sacral region: Secondary | ICD-10-CM | POA: Diagnosis not present

## 2018-01-09 DIAGNOSIS — M9905 Segmental and somatic dysfunction of pelvic region: Secondary | ICD-10-CM | POA: Diagnosis not present

## 2018-01-11 DIAGNOSIS — M5136 Other intervertebral disc degeneration, lumbar region: Secondary | ICD-10-CM | POA: Diagnosis not present

## 2018-01-11 DIAGNOSIS — M9903 Segmental and somatic dysfunction of lumbar region: Secondary | ICD-10-CM | POA: Diagnosis not present

## 2018-01-11 DIAGNOSIS — M9904 Segmental and somatic dysfunction of sacral region: Secondary | ICD-10-CM | POA: Diagnosis not present

## 2018-01-11 DIAGNOSIS — M9905 Segmental and somatic dysfunction of pelvic region: Secondary | ICD-10-CM | POA: Diagnosis not present

## 2018-04-13 ENCOUNTER — Telehealth: Payer: Self-pay | Admitting: Adult Health

## 2018-04-13 NOTE — Telephone Encounter (Signed)
Called patient per Mendel Ryder to reschedule

## 2018-04-21 ENCOUNTER — Encounter: Payer: Self-pay | Admitting: Adult Health

## 2018-04-21 ENCOUNTER — Other Ambulatory Visit: Payer: Self-pay

## 2018-04-25 ENCOUNTER — Telehealth: Payer: Self-pay

## 2018-04-25 NOTE — Telephone Encounter (Signed)
Two separate VM left for patient to call and reschedule her LTS visit with LC. Patient can be scheduled in August.

## 2018-04-27 ENCOUNTER — Other Ambulatory Visit: Payer: Self-pay

## 2018-04-27 ENCOUNTER — Encounter: Payer: PPO | Admitting: Adult Health

## 2018-05-29 DIAGNOSIS — E119 Type 2 diabetes mellitus without complications: Secondary | ICD-10-CM | POA: Diagnosis not present

## 2018-05-29 DIAGNOSIS — G479 Sleep disorder, unspecified: Secondary | ICD-10-CM | POA: Diagnosis not present

## 2018-05-29 DIAGNOSIS — Z8572 Personal history of non-Hodgkin lymphomas: Secondary | ICD-10-CM | POA: Diagnosis not present

## 2018-05-29 DIAGNOSIS — E7800 Pure hypercholesterolemia, unspecified: Secondary | ICD-10-CM | POA: Diagnosis not present

## 2018-05-29 DIAGNOSIS — I10 Essential (primary) hypertension: Secondary | ICD-10-CM | POA: Diagnosis not present

## 2018-05-29 DIAGNOSIS — F418 Other specified anxiety disorders: Secondary | ICD-10-CM | POA: Diagnosis not present

## 2018-07-18 ENCOUNTER — Telehealth: Payer: Self-pay

## 2018-07-18 NOTE — Telephone Encounter (Signed)
NOTES ON FILE FROM DR Radene Ou [phone], REFERRAL IN PROFICIENT

## 2018-07-27 ENCOUNTER — Other Ambulatory Visit: Payer: Self-pay | Admitting: Hematology and Oncology

## 2018-07-27 ENCOUNTER — Other Ambulatory Visit: Payer: Self-pay | Admitting: *Deleted

## 2018-07-27 DIAGNOSIS — Z8572 Personal history of non-Hodgkin lymphomas: Secondary | ICD-10-CM

## 2018-07-31 ENCOUNTER — Other Ambulatory Visit: Payer: Self-pay

## 2018-07-31 ENCOUNTER — Inpatient Hospital Stay: Payer: PPO | Attending: Hematology and Oncology

## 2018-07-31 DIAGNOSIS — Z8572 Personal history of non-Hodgkin lymphomas: Secondary | ICD-10-CM

## 2018-07-31 LAB — CBC WITH DIFFERENTIAL/PLATELET
Abs Immature Granulocytes: 0.01 10*3/uL (ref 0.00–0.07)
Basophils Absolute: 0.1 10*3/uL (ref 0.0–0.1)
Basophils Relative: 1 %
Eosinophils Absolute: 0.2 10*3/uL (ref 0.0–0.5)
Eosinophils Relative: 3 %
HCT: 42.6 % (ref 36.0–46.0)
Hemoglobin: 14.2 g/dL (ref 12.0–15.0)
Immature Granulocytes: 0 %
Lymphocytes Relative: 42 %
Lymphs Abs: 2.8 10*3/uL (ref 0.7–4.0)
MCH: 29 pg (ref 26.0–34.0)
MCHC: 33.3 g/dL (ref 30.0–36.0)
MCV: 86.9 fL (ref 80.0–100.0)
Monocytes Absolute: 0.4 10*3/uL (ref 0.1–1.0)
Monocytes Relative: 7 %
Neutro Abs: 3.1 10*3/uL (ref 1.7–7.7)
Neutrophils Relative %: 47 %
Platelets: 238 10*3/uL (ref 150–400)
RBC: 4.9 MIL/uL (ref 3.87–5.11)
RDW: 12.8 % (ref 11.5–15.5)
WBC: 6.7 10*3/uL (ref 4.0–10.5)
nRBC: 0 % (ref 0.0–0.2)

## 2018-08-22 ENCOUNTER — Other Ambulatory Visit: Payer: Self-pay | Admitting: Family Medicine

## 2018-08-22 DIAGNOSIS — Z1231 Encounter for screening mammogram for malignant neoplasm of breast: Secondary | ICD-10-CM

## 2018-09-10 NOTE — Progress Notes (Signed)
Cardiology Office Note:   Date:  09/11/2018  NAME:  Hannah Frazier    MRN: [id number] DOB:  1950/06/21   PCP:  Aretta Nip, MD  Cardiologist:  No primary care provider on file.  Electrophysiologist:  None   Referring MD: Aretta Nip, MD   Chief Complaint  Patient presents with  . Palpitations    History of Present Illness:   Hannah Frazier is a 68 y.o. female with a hx of diabetes, hypertension, hyperlipidemia who is being seen today for the evaluation of palpitations at the request of Rankins, Bill Salinas, MD.  Ms. Najarro reports a history of palpitations that occur with leaning forward.  She reports an episode 1 month ago and prior to that 6 months ago.  She also reports she had similar symptoms 1 year ago, and had plans to visit cardiology for evaluation but due to infrequent symptoms and her busy schedule she was unable to schedule that appointment.  Associated symptoms have included shortness of breath with exertion and a sensation of presyncope.  She reports she is never passed out with the symptoms but they were bothersome.  The last episode 1 month ago lasted 10 to 15 minutes and resolved with sitting down a relaxation.  She reports no chest pain, or chest pressure with activity and stays quite active with activities around the house as well as gardening.  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  She reports no lower extremity edema or heart failure symptoms, and denies PND and orthopnea.  She expresses a desire to not have aggressive medication management and/or diagnostic work-up given the infrequent nature of her symptoms.  Labs from her primary care doctor in [place] detailed below.  Synopsis: Followed by equal physicians.  Medical history of obesity, diabetes, hyperlipidemia, depression.  Labs from October 2019 show A1c 6.9, creatinine 0.84, TSH 4.48, total cholesterol 198, HDL 39, triglycerides 186, LDL 122.  Past Medical  History: Past Medical History:  Diagnosis Date  . Anxiety 02/08/2011  . Diabetes mellitus   . Headache   . History of B-cell lymphoma 02/08/2011   Extranodal: mass in terminal ileum presenting with abdominal pain April, 2011  . Hypertension   . 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 Vision abnormalities     Past Surgical History: Past Surgical History:  Procedure Laterality Date  . ABDOMINAL HYSTERECTOMY    . COLECTOMY      Current Medications: Current Meds  Medication Sig  . aspirin 81 MG tablet Take 81 mg by mouth every evening.   Marland Kitchen aspirin-acetaminophen-caffeine (EXCEDRIN MIGRAINE) 250-250-65 MG per tablet Take 1 tablet by mouth every 6 (six) hours as needed for pain (for headache).  . Blood Glucose Monitoring Suppl (ONE TOUCH ULTRA MINI) W/DEVICE KIT   . hydrochlorothiazide (HYDRODIURIL) 25 MG tablet Take 25 mg by mouth every morning.   . loperamide (IMODIUM A-D) 2 MG tablet Take 2 mg by mouth as needed for diarrhea or loose stools (takes once a week).  . LORazepam (ATIVAN) 1 MG tablet Take 0.5 mg by mouth at bedtime as needed (for sleep).   . ONE TOUCH ULTRA TEST test strip   . ONETOUCH DELICA LANCETS 46T MISC   . pravastatin (PRAVACHOL) 40 MG tablet TAKE 1 TABLET BY MOUTH EVERY DAY  . zolpidem (AMBIEN) 10 MG tablet Take 5-10 mg by mouth at bedtime as needed for sleep.      Allergies:    Simvastatin  and Lisinopril   Social History: Social History   Socioeconomic History  . Marital status: Divorced    Spouse name: Not on file  . Number of children: Not on file  . Years of education: Not on file  . Highest education level: Not on file  Occupational History  . Not on file  Social Needs  . Financial resource strain: Not on file  . Food insecurity    Worry: Not on file    Inability: Not on file  . Transportation needs    Medical: Not on file    Non-medical: Not on file  Tobacco Use  . Smoking status: Never Smoker  . Smokeless tobacco: Never Used  Substance  and Sexual Activity  . Alcohol use: No  . Drug use: No  . Sexual activity: Not on file  Lifestyle  . Physical activity    Days per week: Not on file    Minutes per session: Not on file  . Stress: Not on file  Relationships  . Social Herbalist on phone: Not on file    Gets together: Not on file    Attends religious service: Not on file    Active member of club or organization: Not on file    Attends meetings of clubs or organizations: Not on file    Relationship status: Not on file  Other Topics Concern  . Not on file  Social History Narrative  . Not on file     Family History: The patient's family history includes Cancer in her mother; Heart disease in her father; Parkinson's disease in her mother.  ROS:   All other ROS reviewed and negative. Pertinent positives noted in the HPI.     EKGs/Labs/Other Studies Reviewed:   The following studies were personally reviewed by me today: Detailed notes and labs as described above from [place].  EKG:  EKG is ordered today.  The ekg ordered today demonstrates normal sinus rhythm, heart rate 79, normal intervals, borderline T wave abnormalities, and was personally reviewed by me.   Recent Labs: 07/31/2018: Hemoglobin 14.2; Platelets 238   Recent Lipid Panel No results found for: CHOL, TRIG, HDL, CHOLHDL, VLDL, LDLCALC, LDLDIRECT  Physical Exam:   VS:  BP 112/88   Pulse 92   Temp (!) 97.2 F (36.2 C)   Ht 5' 6.5" (1.689 m)   Wt 186 lb 6.4 oz (84.6 kg)   SpO2 97%   BMI 29.64 kg/m    Wt Readings from Last 3 Encounters:  09/11/18 186 lb 6.4 oz (84.6 kg)  09/11/18 185 lb 6.4 oz (84.1 kg)  04/15/17 200 lb 1.6 oz (90.8 kg)    General: Well nourished, well developed, in no acute distress Heart: Atraumatic, normal size  Eyes: PEERLA, EOMI  Neck: Supple, no JVD Endocrine: No thryomegaly Cardiac: Normal S1, S2; RRR; no murmurs, rubs, or gallops Lungs: Clear to auscultation bilaterally, no wheezing, rhonchi or  rales  Abd: Soft, nontender, no hepatomegaly  Ext: No edema, pulses 2+ Musculoskeletal: No deformities, BUE and BLE strength normal and equal Skin: Warm and dry, no rashes   Neuro: Alert and oriented to person, place, time, and situation, CNII-XII grossly intact, no focal deficits  Psych: Normal mood and affect   ASSESSMENT:   NAME@ is a 68 y.o. female who presents for the following: 1. Palpitations   2. Shortness of breath   3. Essential hypertension   4. Mixed hyperlipidemia     PLAN:  1. Palpitations -She reports infrequent sensation of rapid heartbeat with leaning forward that resolves with sitting and time.  The symptoms occur most recently in the 1 month ago and before that 6 months prior.  Due to the infrequent nature, I discussed the possibility of an event monitor versus her purchasing a smart phone with ambulatory ECG capabilities, such as [employer].  She states at this time she would prefer to just monitor herself for symptoms as they have not recurred. -I will obtain a transthoracic echocardiogram to ensure her symptoms are not related to heart failure, and/or structural heart disease.  However, I do not suspect she has either given a rather benign exam. -We will follow-up with her by phone regarding the results of her echocardiogram, and we will determine if she needs further work-up for her palpitations if her symptoms are to recur.  Of note her TSH has recently obtained and is normal.  2. Shortness of breath -She reports symptoms of shortness of breath and associate with her palpitations -Transthoracic echocardiogram as above  3. Essential hypertension -Well-controlled and managed per PCP  4. Mixed hyperlipidemia -LDL a little elevated however she is recently started back on her pravastatin and this is managed by her PCP   Disposition: Return if symptoms worsen or fail to improve.  Medication Adjustments/Labs and Tests Ordered: Current medicines are reviewed at length  with the patient today.  Concerns regarding medicines are outlined above.  Orders Placed This Encounter  Procedures  . EKG 12-Lead  . ECHOCARDIOGRAM COMPLETE   No orders of the defined types were placed in this encounter.   Patient Instructions  1, You will obtain a heart ultrasound  2. We will see you back if your symptoms worsen or come back.     Signed, Addison Naegeli. Audie Box, [place]  [address] [place], [place] [address] [phone]  09/11/2018 3:34 PM

## 2018-09-11 ENCOUNTER — Encounter: Payer: Self-pay | Admitting: Cardiovascular Disease

## 2018-09-11 ENCOUNTER — Ambulatory Visit: Payer: PPO | Admitting: Cardiovascular Disease

## 2018-09-11 ENCOUNTER — Other Ambulatory Visit: Payer: Self-pay

## 2018-09-11 ENCOUNTER — Encounter: Payer: Self-pay | Admitting: Adult Health

## 2018-09-11 ENCOUNTER — Inpatient Hospital Stay: Payer: PPO | Attending: Hematology and Oncology | Admitting: Adult Health

## 2018-09-11 VITALS — BP 112/88 | HR 92 | Temp 97.2°F | Ht 66.5 in | Wt 186.4 lb

## 2018-09-11 VITALS — BP 122/76 | HR 84 | Temp 98.9°F | Resp 18 | Ht 66.0 in | Wt 185.4 lb

## 2018-09-11 DIAGNOSIS — Z888 Allergy status to other drugs, medicaments and biological substances status: Secondary | ICD-10-CM | POA: Insufficient documentation

## 2018-09-11 DIAGNOSIS — R002 Palpitations: Secondary | ICD-10-CM

## 2018-09-11 DIAGNOSIS — M549 Dorsalgia, unspecified: Secondary | ICD-10-CM | POA: Diagnosis not present

## 2018-09-11 DIAGNOSIS — R0602 Shortness of breath: Secondary | ICD-10-CM

## 2018-09-11 DIAGNOSIS — Z8049 Family history of malignant neoplasm of other genital organs: Secondary | ICD-10-CM | POA: Diagnosis not present

## 2018-09-11 DIAGNOSIS — E782 Mixed hyperlipidemia: Secondary | ICD-10-CM | POA: Diagnosis not present

## 2018-09-11 DIAGNOSIS — Z820 Family history of epilepsy and other diseases of the nervous system: Secondary | ICD-10-CM | POA: Insufficient documentation

## 2018-09-11 DIAGNOSIS — Z79899 Other long term (current) drug therapy: Secondary | ICD-10-CM | POA: Diagnosis not present

## 2018-09-11 DIAGNOSIS — Z9221 Personal history of antineoplastic chemotherapy: Secondary | ICD-10-CM | POA: Insufficient documentation

## 2018-09-11 DIAGNOSIS — Z8572 Personal history of non-Hodgkin lymphomas: Secondary | ICD-10-CM | POA: Insufficient documentation

## 2018-09-11 DIAGNOSIS — I10 Essential (primary) hypertension: Secondary | ICD-10-CM

## 2018-09-11 DIAGNOSIS — Z8249 Family history of ischemic heart disease and other diseases of the circulatory system: Secondary | ICD-10-CM | POA: Diagnosis not present

## 2018-09-11 DIAGNOSIS — R634 Abnormal weight loss: Secondary | ICD-10-CM | POA: Insufficient documentation

## 2018-09-11 DIAGNOSIS — R1011 Right upper quadrant pain: Secondary | ICD-10-CM | POA: Insufficient documentation

## 2018-09-11 DIAGNOSIS — G8929 Other chronic pain: Secondary | ICD-10-CM | POA: Diagnosis not present

## 2018-09-11 NOTE — Patient Instructions (Signed)
1, You will obtain a heart ultrasound  2. We will see you back if your symptoms worsen or come back.

## 2018-09-11 NOTE — Progress Notes (Signed)
CLINIC:  Survivorship   REASON FOR VISIT:  Routine follow-up for history of lymphoma.   BRIEF ONCOLOGIC HISTORY:  Oncology History Overview Note  Lymphoma, high grade involving the terminal ileum   Primary site: Lymphoid Neoplasms   Staging method: AJCC 6th Edition   Clinical: Stage I signed by Heath Lark, MD on 04/23/2013  1:17 PM   Pathologic: Stage I signed by Heath Lark, MD on 04/23/2013  1:17 PM   Summary: Stage I     History of B-cell lymphoma  04/28/2009 Procedure   Colonoscopy and biopsy show possible lymphoma involving the terminal ileum   05/08/2009 Bone Marrow Biopsy   Bone marrow biopsy was negative.   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   06/06/2009 - 09/22/2009 Chemotherapy   The patient completed 6 cycles of R. CHOP chemotherapy.   06/12/2009 Imaging   PET CT scan show no evidence of residual disease.   10/16/2009 Imaging   PET CT scan show no evidence of residual disease.   06/01/2012 Procedure   Colonoscopy showed no evidence of recurrence of disease.   10/24/2012 Imaging   CT scan show no evidence of recurrence of disease.   10/11/2013 Imaging   CT scan show no evidence of disease.   04/22/2015 Imaging   CT scan show no evidence of disease.     INTERVAL HISTORY:  Hannah Frazier presents to the Survivorship Clinic today for routine follow-up for her history of lymphoma from 2011.  Overall, she reports feeling quite well.   She continues to have a chronic intermittent pain in her back that radiates around to her abdomen.  She has undergone RUQ ultrasound and CT scans that have shown no etiology of this pain.  She says the pain has no alleviating or aggravating factors.  She says that she would describe it as a discomfort.    Hannah Frazier continues to see her PCP regularly.  She has intentionally lost 20 pounds intentionally.  She is very happy with this.   She denies any fatigue or night sweats.  She is going to undergo mammogram later this month and will also see cardiology today for some heart issues she was referred to them about by her PCP.  She notes she is due for colonoscopy in 10/2018.  She will see dermatology next month for a skin check.   She remains active for exercise by doing housework and yardwork.  She changed her diet to lose weight.    REVIEW OF SYSTEMS:  Review of Systems  Constitutional: Negative for appetite change, chills, fatigue, fever and unexpected weight change.  HENT:   Negative for hearing loss, lump/mass and trouble swallowing.   Eyes: Negative for eye problems and icterus.  Respiratory: Negative for chest tightness, cough and shortness of breath.   Cardiovascular: Negative for chest pain, leg swelling and palpitations.  Gastrointestinal: Positive for abdominal pain (see interval history). Negative for abdominal distention, blood in stool, constipation, diarrhea, nausea and vomiting.  Endocrine: Negative for hot flashes.  Musculoskeletal: Negative for arthralgias.  Skin: Negative for itching and rash.  Neurological: Negative for dizziness, extremity weakness and numbness.  Hematological: Negative for adenopathy. Does not bruise/bleed easily.  Psychiatric/Behavioral: Negative for depression. The patient is not nervous/anxious.      PAST MEDICAL/SURGICAL HISTORY:  Past Medical History:  Diagnosis Date  . Anxiety 02/08/2011  . Diabetes mellitus   . Headache   .  History of B-cell lymphoma 02/08/2011   Extranodal: mass in terminal ileum presenting with abdominal pain April, 2011  . Hypertension   . 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 Vision abnormalities    Past Surgical History:  Procedure Laterality Date  . ABDOMINAL HYSTERECTOMY    . COLECTOMY       ALLERGIES:  Allergies  Allergen Reactions  . Simvastatin Swelling    Tongue swelling   . Lisinopril Nausea Only     CURRENT MEDICATIONS:   Outpatient Encounter Medications as of 09/11/2018  Medication Sig Note  . aspirin 81 MG tablet Take 81 mg by mouth every evening.    Marland Kitchen aspirin-acetaminophen-caffeine (EXCEDRIN MIGRAINE) 250-250-65 MG per tablet Take 1 tablet by mouth every 6 (six) hours as needed for pain (for headache).   . Blood Glucose Monitoring Suppl (ONE TOUCH ULTRA MINI) W/DEVICE KIT  10/15/2013: Received from: External Pharmacy  . hydrochlorothiazide (HYDRODIURIL) 25 MG tablet Take 25 mg by mouth every morning.    . loperamide (IMODIUM A-D) 2 MG tablet Take 2 mg by mouth as needed for diarrhea or loose stools (takes once a week).   . LORazepam (ATIVAN) 1 MG tablet Take 0.5 mg by mouth at bedtime as needed (for sleep).    . ONE TOUCH ULTRA TEST test strip  10/15/2013: Received from: External Pharmacy  . ONETOUCH DELICA LANCETS 58K MISC  10/15/2013: Received from: External Pharmacy  . pravastatin (PRAVACHOL) 40 MG tablet TAKE 1 TABLET BY MOUTH EVERY DAY   . zolpidem (AMBIEN) 10 MG tablet Take 5-10 mg by mouth at bedtime as needed for sleep.  04/29/2012: .  Marland Kitchen [DISCONTINUED] FLUoxetine (PROZAC) 20 MG tablet Take 20 mg by mouth daily.   . [DISCONTINUED] potassium chloride SA (K-DUR,KLOR-CON) 20 MEQ tablet Take 1 tablet (20 mEq total) by mouth 2 (two) times daily.    No facility-administered encounter medications on file as of 09/11/2018.      ONCOLOGIC FAMILY HISTORY:  Family History  Problem Relation Age of Onset  . Cancer Mother        cervical ca  . Parkinson's disease Mother   . Heart disease Father       SOCIAL HISTORY:  Social History   Socioeconomic History  . Marital status: Divorced    Spouse name: Not on file  . Number of children: Not on file  . Years of education: Not on file  . Highest education level: Not on file  Occupational History  . Not on file  Social Needs  . Financial resource strain: Not on file  . Food insecurity    Worry: Not on file    Inability: Not on file  . Transportation needs     Medical: Not on file    Non-medical: Not on file  Tobacco Use  . Smoking status: Never Smoker  . Smokeless tobacco: Never Used  Substance and Sexual Activity  . Alcohol use: No  . Drug use: No  . Sexual activity: Not on file  Lifestyle  . Physical activity    Days per week: Not on file    Minutes per session: Not on file  . Stress: Not on file  Relationships  . Social Herbalist on phone: Not on file    Gets together: Not on file    Attends religious service: Not on file    Active member of club or organization: Not on file    Attends meetings of clubs or organizations:  Not on file    Relationship status: Not on file  . Intimate partner violence    Fear of current or ex partner: Not on file    Emotionally abused: Not on file    Physically abused: Not on file    Forced sexual activity: Not on file  Other Topics Concern  . Not on file  Social History Narrative  . Not on file      PHYSICAL EXAMINATION:  Vital Signs: Vitals:   09/11/18 1306  BP: 122/76  Pulse: 84  Resp: 18  Temp: 98.9 F (37.2 C)  SpO2: 100%   Filed Weights   09/11/18 1306  Weight: 185 lb 6.4 oz (84.1 kg)   General: Well-nourished, well-appearing female in no acute distress.Unaccompanied today. HEENT: Head is normocephalic.  Pupils equal and reactive to light. Conjunctivae clear without exudate.  Sclerae anicteric. Oral mucosa is pink, moist.  Oropharynx is pink without lesions or erythema.  Lymph: No cervical, supraclavicular, infraclavicular, or axillary lymphadenopathy noted on palpation.  Cardiovascular: Regular rate and rhythm.Marland Kitchen Respiratory: Clear to auscultation bilaterally. Chest expansion symmetric; breathing non-labored.  Breasts: bilateral breast exam is benign GI: Abdomen soft and round, non-distended. Bowel sounds normoactive. No hepatosplenomegaly.   GU: Deferred.  Neuro: No focal deficits. Steady gait.  Psych: Mood and affect normal and appropriate for situation.   Extremities: No edema. Skin: Warm and dry.   LABORATORY DATA:  No visits with results within 1 Day(s) from this visit.  Latest known visit with results is:  Appointment on 07/31/2018  Component Date Value Ref Range Status  . WBC 07/31/2018 6.7  4.0 - 10.5 K/uL Final  . RBC 07/31/2018 4.90  3.87 - 5.11 MIL/uL Final  . Hemoglobin 07/31/2018 14.2  12.0 - 15.0 g/dL Final  . HCT 07/31/2018 42.6  36.0 - 46.0 % Final  . MCV 07/31/2018 86.9  80.0 - 100.0 fL Final  . MCH 07/31/2018 29.0  26.0 - 34.0 pg Final  . MCHC 07/31/2018 33.3  30.0 - 36.0 g/dL Final  . RDW 07/31/2018 12.8  11.5 - 15.5 % Final  . Platelets 07/31/2018 238  150 - 400 K/uL Final  . nRBC 07/31/2018 0.0  0.0 - 0.2 % Final  . Neutrophils Relative % 07/31/2018 47  % Final  . Neutro Abs 07/31/2018 3.1  1.7 - 7.7 K/uL Final  . Lymphocytes Relative 07/31/2018 42  % Final  . Lymphs Abs 07/31/2018 2.8  0.7 - 4.0 K/uL Final  . Monocytes Relative 07/31/2018 7  % Final  . Monocytes Absolute 07/31/2018 0.4  0.1 - 1.0 K/uL Final  . Eosinophils Relative 07/31/2018 3  % Final  . Eosinophils Absolute 07/31/2018 0.2  0.0 - 0.5 K/uL Final  . Basophils Relative 07/31/2018 1  % Final  . Basophils Absolute 07/31/2018 0.1  0.0 - 0.1 K/uL Final  . Immature Granulocytes 07/31/2018 0  % Final  . Abs Immature Granulocytes 07/31/2018 0.01  0.00 - 0.07 K/uL Final   Performed at [facility], [place] [address], [place], [place] [address]    DIAGNOSTIC IMAGING:  None for this visit     ASSESSMENT AND PLAN:  Hannah Frazier is a pleasant 68 y.o.  Woman with stage 1 lymphoma , diagnosed in 2011; treated with R-CHOP chemotherapy. Hannah Frazier presents to the Survivorship Clinic for surveillance and routine follow-up.   1. History of Stage 1 cancer:  Hannah Frazier is currently clinically and radiographically without evidence of disease or recurrence of lymphoma. She  will follow-up in the Survivorship Clinic in 1 year with labs,  history, and physical exam per surveillance protocol.  I encouraged her to call me with any questions or concerns before her next visit at the cancer center, and I would be happy to see the patient sooner, if needed.    2. Back/abd pain: chronic and stable.  This is unchanged and imaging has not resulted anything.  We will continue to monitor and if worsens, will consider re imaging.  3. Cancer screening:  Due to Hannah Frazier's history and age, she should receive screening for skin cancers, breast cancer, colon cancer, and gynecologic cancers. The patient was encouraged to follow-up with her PCP for appropriate cancer screenings.   4. Health maintenance and wellness promotion: Hannah Frazier was encouraged to consume 5-7 servings of fruits and vegetables per day. The patient was also encouraged to engage in moderate to vigorous exercise for 30 minutes per day most days of the week. Hannah Frazier was instructed to limit her alcohol consumption and continue to abstain from tobacco use/was encouraged stop smoking.      Dispo:  -Return to cancer center to see Survivorship NP in one year  A total of (30) minutes of face-to-face time was spent with this patient with greater than 50% of that time in counseling and care-coordination.   Gardenia Phlegm, NP Survivorship Program [facility] [phone]   Note: PRIMARY CARE PROVIDER Aretta Nip, [place] [phone]

## 2018-09-14 ENCOUNTER — Ambulatory Visit (HOSPITAL_COMMUNITY): Payer: PPO | Attending: Cardiovascular Disease

## 2018-09-14 ENCOUNTER — Other Ambulatory Visit: Payer: Self-pay

## 2018-09-14 DIAGNOSIS — R0602 Shortness of breath: Secondary | ICD-10-CM | POA: Diagnosis not present

## 2018-09-14 DIAGNOSIS — I10 Essential (primary) hypertension: Secondary | ICD-10-CM | POA: Diagnosis not present

## 2018-09-14 DIAGNOSIS — R002 Palpitations: Secondary | ICD-10-CM | POA: Diagnosis not present

## 2018-09-14 DIAGNOSIS — E782 Mixed hyperlipidemia: Secondary | ICD-10-CM

## 2018-10-10 DIAGNOSIS — L814 Other melanin hyperpigmentation: Secondary | ICD-10-CM | POA: Diagnosis not present

## 2018-10-10 DIAGNOSIS — L309 Dermatitis, unspecified: Secondary | ICD-10-CM | POA: Diagnosis not present

## 2018-10-10 DIAGNOSIS — Z85828 Personal history of other malignant neoplasm of skin: Secondary | ICD-10-CM | POA: Diagnosis not present

## 2018-10-10 DIAGNOSIS — D225 Melanocytic nevi of trunk: Secondary | ICD-10-CM | POA: Diagnosis not present

## 2018-10-10 DIAGNOSIS — L821 Other seborrheic keratosis: Secondary | ICD-10-CM | POA: Diagnosis not present

## 2018-10-26 ENCOUNTER — Ambulatory Visit
Admission: RE | Admit: 2018-10-26 | Discharge: 2018-10-26 | Disposition: A | Discharge status: Home / Self Care | Payer: PPO | Source: Ambulatory Visit | Attending: Family Medicine | Admitting: Family Medicine

## 2018-10-26 ENCOUNTER — Other Ambulatory Visit: Payer: Self-pay

## 2018-10-26 DIAGNOSIS — Z1231 Encounter for screening mammogram for malignant neoplasm of breast: Secondary | ICD-10-CM

## 2018-10-30 ENCOUNTER — Other Ambulatory Visit: Payer: Self-pay | Admitting: Family Medicine

## 2018-10-30 DIAGNOSIS — R928 Other abnormal and inconclusive findings on diagnostic imaging of breast: Secondary | ICD-10-CM

## 2018-11-01 ENCOUNTER — Other Ambulatory Visit: Payer: Self-pay | Admitting: Family Medicine

## 2018-11-01 ENCOUNTER — Other Ambulatory Visit: Payer: Self-pay

## 2018-11-01 ENCOUNTER — Ambulatory Visit
Admission: RE | Admit: 2018-11-01 | Discharge: 2018-11-01 | Disposition: A | Discharge status: Home / Self Care | Payer: PPO | Source: Ambulatory Visit | Attending: Family Medicine | Admitting: Family Medicine

## 2018-11-01 DIAGNOSIS — R921 Mammographic calcification found on diagnostic imaging of breast: Secondary | ICD-10-CM | POA: Diagnosis not present

## 2018-11-01 DIAGNOSIS — R928 Other abnormal and inconclusive findings on diagnostic imaging of breast: Secondary | ICD-10-CM

## 2018-11-10 DIAGNOSIS — Z1159 Encounter for screening for other viral diseases: Secondary | ICD-10-CM | POA: Diagnosis not present

## 2018-11-15 DIAGNOSIS — Z86010 Personal history of colonic polyps: Secondary | ICD-10-CM | POA: Diagnosis not present

## 2018-11-15 DIAGNOSIS — K649 Unspecified hemorrhoids: Secondary | ICD-10-CM | POA: Diagnosis not present

## 2018-11-17 DIAGNOSIS — E119 Type 2 diabetes mellitus without complications: Secondary | ICD-10-CM | POA: Diagnosis not present

## 2018-11-17 DIAGNOSIS — E7800 Pure hypercholesterolemia, unspecified: Secondary | ICD-10-CM | POA: Diagnosis not present

## 2018-11-17 DIAGNOSIS — R21 Rash and other nonspecific skin eruption: Secondary | ICD-10-CM | POA: Diagnosis not present

## 2018-11-17 DIAGNOSIS — Z0000 Encounter for general adult medical examination without abnormal findings: Secondary | ICD-10-CM | POA: Diagnosis not present

## 2018-11-17 DIAGNOSIS — I10 Essential (primary) hypertension: Secondary | ICD-10-CM | POA: Diagnosis not present

## 2018-11-17 DIAGNOSIS — G479 Sleep disorder, unspecified: Secondary | ICD-10-CM | POA: Diagnosis not present

## 2018-11-17 DIAGNOSIS — Z1159 Encounter for screening for other viral diseases: Secondary | ICD-10-CM | POA: Diagnosis not present

## 2018-11-17 DIAGNOSIS — Z23 Encounter for immunization: Secondary | ICD-10-CM | POA: Diagnosis not present

## 2018-11-21 DIAGNOSIS — L404 Guttate psoriasis: Secondary | ICD-10-CM | POA: Diagnosis not present

## 2018-11-21 DIAGNOSIS — Z23 Encounter for immunization: Secondary | ICD-10-CM | POA: Diagnosis not present

## 2019-02-06 ENCOUNTER — Telehealth: Payer: Self-pay | Admitting: Adult Health

## 2019-02-06 NOTE — Telephone Encounter (Signed)
Scheduled per provider. Called and spoke with pt, confirmed 3/15 appt

## 2019-02-26 ENCOUNTER — Other Ambulatory Visit: Payer: Self-pay | Admitting: Family Medicine

## 2019-03-06 ENCOUNTER — Ambulatory Visit: Payer: PPO | Attending: Internal Medicine

## 2019-03-06 DIAGNOSIS — Z23 Encounter for immunization: Secondary | ICD-10-CM

## 2019-03-06 NOTE — Progress Notes (Signed)
   Covid-19 Vaccination Clinic  Name:  Hannah Frazier    MRN: [id number] DOB: [birth year]  03/06/2019  [name] was observed post Covid-19 immunization for 15 minutes without incidence. She was provided with Vaccine Information Sheet and instruction to access the V-Safe system.   [name] was instructed to call 911 with any severe reactions post vaccine: Marland Kitchen Difficulty breathing  . Swelling of your face and throat  . A fast heartbeat  . A bad rash all over your body  . Dizziness and weakness    Immunizations Administered    Name Date Dose VIS Date Route   Pfizer COVID-19 Vaccine 03/06/2019  2:08 PM 0.3 mL 01/05/2019 Intramuscular   Manufacturer: [place]   Lot: [place] [id number]   [place]: [id number]

## 2019-03-08 ENCOUNTER — Ambulatory Visit: Payer: PPO

## 2019-03-26 ENCOUNTER — Ambulatory Visit: Payer: PPO | Attending: Internal Medicine

## 2019-03-26 DIAGNOSIS — Z23 Encounter for immunization: Secondary | ICD-10-CM | POA: Insufficient documentation

## 2019-03-26 NOTE — Progress Notes (Signed)
   Covid-19 Vaccination Clinic  Name:  Hannah Frazier    MRN: [id number] DOB: 20-Feb-1950  03/26/2019  [name] was observed post Covid-19 immunization for 15 minutes without incidence. She was provided with Vaccine Information Sheet and instruction to access the V-Safe system.   [name] was instructed to call 911 with any severe reactions post vaccine: Marland Kitchen Difficulty breathing  . Swelling of your face and throat  . A fast heartbeat  . A bad rash all over your body  . Dizziness and weakness    Immunizations Administered    Name Date Dose VIS Date Route   Pfizer COVID-19 Vaccine 03/26/2019  1:18 PM 0.3 mL 01/05/2019 Intramuscular   Manufacturer: [place]   Lot: [id number]   [place]: [id number]

## 2019-04-06 NOTE — Progress Notes (Signed)
CLINIC:  Survivorship   REASON FOR VISIT:  Routine follow-up for history of lymphoma.   BRIEF ONCOLOGIC HISTORY:  Oncology History Overview Note  Lymphoma, high grade involving the terminal ileum   Primary site: Lymphoid Neoplasms   Staging method: AJCC 6th Edition   Clinical: Stage I signed by Heath Lark, MD on 04/23/2013  1:17 PM   Pathologic: Stage I signed by Heath Lark, MD on 04/23/2013  1:17 PM   Summary: Stage I     History of B-cell lymphoma  04/28/2009 Procedure   Colonoscopy and biopsy show possible lymphoma involving the terminal ileum   05/08/2009 Bone Marrow Biopsy   Bone marrow biopsy was negative.   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   06/06/2009 - 09/22/2009 Chemotherapy   The patient completed 6 cycles of R. CHOP chemotherapy.   06/12/2009 Imaging   PET CT scan show no evidence of residual disease.   10/16/2009 Imaging   PET CT scan show no evidence of residual disease.   06/01/2012 Procedure   Colonoscopy showed no evidence of recurrence of disease.   10/24/2012 Imaging   CT scan show no evidence of recurrence of disease.   10/11/2013 Imaging   CT scan show no evidence of disease.   04/22/2015 Imaging   CT scan show no evidence of disease.     INTERVAL HISTORY:  Hannah Frazier presents to the Survivorship Clinic today for routine follow-up for her history of lymphoma from 2011.  Overall, she reports feeling quite well.  She notes she continues to have chronic side and abdominal discomfort that has been present since her scan in 2017.  The scan showed no evidence of cancer or etiology of the discomfort.  An abdominal ultrasound in 2019 was also negative.  She has occasional diarrhea, however notes that it is ongoing and unchanged.  This discomfort she has had is stable and remains chronic.  Hannah denies any fatigue, unintentional weight loss, or  night sweats.  She sees her PCP regularly.  She is up to date with her vaccinations.  She is up to date with colonoscopy and skin cancer screening.  She does not follow a particular diet, and does not exercise.     REVIEW OF SYSTEMS:  Review of Systems  Constitutional: Negative for appetite change, chills, fatigue and unexpected weight change.  HENT:   Negative for hearing loss, mouth sores and trouble swallowing.   Eyes: Negative for eye problems and icterus.  Respiratory: Negative for chest tightness, cough, shortness of breath and wheezing.   Cardiovascular: Negative for chest pain and palpitations.  Gastrointestinal: Positive for abdominal pain and diarrhea. Negative for abdominal distention, constipation, nausea and vomiting.  Endocrine: Negative for hot flashes.  Genitourinary: Negative for difficulty urinating.   Musculoskeletal: Negative for arthralgias.  Skin: Negative for itching and rash.  Neurological: Negative for dizziness, extremity weakness, headaches and numbness.  Hematological: Negative for adenopathy. Does not bruise/bleed easily.  Psychiatric/Behavioral: Negative for depression. The patient is not nervous/anxious.      PAST MEDICAL/SURGICAL HISTORY:  Past Medical History:  Diagnosis Date  . Anxiety 02/08/2011  . Diabetes mellitus   . Headache   . History of B-cell lymphoma 02/08/2011   Extranodal: mass in terminal ileum presenting with abdominal pain April, 2011  . Hypertension   . 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 Vision abnormalities    Past Surgical  History:  Procedure Laterality Date  . ABDOMINAL HYSTERECTOMY    . COLECTOMY       ALLERGIES:  Allergies  Allergen Reactions  . Simvastatin Swelling    Tongue swelling   . Lisinopril Nausea Only     CURRENT MEDICATIONS:  Outpatient Encounter Medications as of 04/09/2019  Medication Sig Note  . aspirin 81 MG tablet Take 81 mg by mouth every evening.    . Blood Glucose Monitoring Suppl (ONE  TOUCH ULTRA MINI) W/DEVICE KIT  10/15/2013: Received from: External Pharmacy  . hydrochlorothiazide (HYDRODIURIL) 25 MG tablet Take 25 mg by mouth every morning.    Marland Kitchen LORazepam (ATIVAN) 1 MG tablet Take 0.5 mg by mouth at bedtime as needed (for sleep).    . ONE TOUCH ULTRA TEST test strip  10/15/2013: Received from: External Pharmacy  . ONETOUCH DELICA LANCETS 99I MISC  10/15/2013: Received from: External Pharmacy  . pravastatin (PRAVACHOL) 40 MG tablet TAKE 1 TABLET BY MOUTH EVERY DAY   . zolpidem (AMBIEN) 10 MG tablet Take 5-10 mg by mouth at bedtime as needed for sleep.  04/29/2012: .  Marland Kitchen [DISCONTINUED] aspirin-acetaminophen-caffeine (EXCEDRIN MIGRAINE) 250-250-65 MG per tablet Take 1 tablet by mouth every 6 (six) hours as needed for pain (for headache).   . [DISCONTINUED] FLUoxetine (PROZAC) 20 MG tablet Take 20 mg by mouth daily.   . [DISCONTINUED] loperamide (IMODIUM A-D) 2 MG tablet Take 2 mg by mouth as needed for diarrhea or loose stools (takes once a week).   . [DISCONTINUED] potassium chloride SA (K-DUR,KLOR-CON) 20 MEQ tablet Take 1 tablet (20 mEq total) by mouth 2 (two) times daily.    No facility-administered encounter medications on file as of 04/09/2019.     ONCOLOGIC FAMILY HISTORY:  Family History  Problem Relation Age of Onset  . Cancer Mother        cervical ca  . Parkinson's disease Mother   . Heart disease Father       SOCIAL HISTORY:  Social History   Socioeconomic History  . Marital status: Divorced    Spouse name: Not on file  . Number of children: Not on file  . Years of education: Not on file  . Highest education level: Not on file  Occupational History  . Not on file  Tobacco Use  . Smoking status: Never Smoker  . Smokeless tobacco: Never Used  Substance and Sexual Activity  . Alcohol use: No  . Drug use: No  . Sexual activity: Not on file  Other Topics Concern  . Not on file  Social History Narrative  . Not on file   Social Determinants of  Health   Financial Resource Strain:   . Difficulty of Paying Living Expenses:   Food Insecurity:   . Worried About Charity fundraiser in the Last Year:   . Arboriculturist in the Last Year:   Transportation Needs:   . Film/video editor (Medical):   Marland Kitchen Lack of Transportation (Non-Medical):   Physical Activity:   . Days of Exercise per Week:   . Minutes of Exercise per Session:   Stress:   . Feeling of Stress :   Social Connections:   . Frequency of Communication with Friends and Family:   . Frequency of Social Gatherings with Friends and Family:   . Attends Religious Services:   . Active Member of Clubs or Organizations:   . Attends Archivist Meetings:   Marland Kitchen Marital Status:   Intimate Production manager  Violence:   . Fear of Current or Ex-Partner:   . Emotionally Abused:   Marland Kitchen Physically Abused:   . Sexually Abused:       PHYSICAL EXAMINATION:  Vital Signs: Vitals:   04/09/19 0825  BP: 129/72  Pulse: 74  Resp: 18  Temp: 97.6 F (36.4 C)  SpO2: 99%   Filed Weights   04/09/19 0825  Weight: 192 lb 14.4 oz (87.5 kg)   General: Well-nourished, well-appearing female in no acute distress.Unaccompanied today. HEENT: Head is normocephalic.  Pupils equal and reactive to light. Conjunctivae clear without exudate.  Sclerae anicteric. Oral mucosa is pink, moist.  Oropharynx is pink without lesions or erythema.  Lymph: No cervical, supraclavicular, infraclavicular, or axillary lymphadenopathy noted on palpation.  Cardiovascular: Regular rate and rhythm.Marland Kitchen Respiratory: Clear to auscultation bilaterally. Chest expansion symmetric; breathing non-labored.  Breasts: bilateral breast exam is benign GI: Abdomen soft and round, non-distended. Bowel sounds normoactive. No hepatosplenomegaly.   GU: Deferred.  Neuro: No focal deficits. Steady gait.  Psych: Mood and affect normal and appropriate for situation.  Extremities: No edema. Skin: Warm and dry.   LABORATORY DATA:   Appointment on 04/09/2019  Component Date Value Ref Range Status  . WBC Count 04/09/2019 6.6  4.0 - 10.5 K/uL Final  . RBC 04/09/2019 5.28* 3.87 - 5.11 MIL/uL Final  . Hemoglobin 04/09/2019 15.1* 12.0 - 15.0 g/dL Final  . HCT 04/09/2019 45.5  36.0 - 46.0 % Final  . MCV 04/09/2019 86.2  80.0 - 100.0 fL Final  . MCH 04/09/2019 28.6  26.0 - 34.0 pg Final  . MCHC 04/09/2019 33.2  30.0 - 36.0 g/dL Final  . RDW 04/09/2019 12.8  11.5 - 15.5 % Final  . Platelet Count 04/09/2019 276  150 - 400 K/uL Final  . nRBC 04/09/2019 0.0  0.0 - 0.2 % Final  . Neutrophils Relative % 04/09/2019 41  % Final  . Neutro Abs 04/09/2019 2.7  1.7 - 7.7 K/uL Final  . Lymphocytes Relative 04/09/2019 48  % Final  . Lymphs Abs 04/09/2019 3.2  0.7 - 4.0 K/uL Final  . Monocytes Relative 04/09/2019 7  % Final  . Monocytes Absolute 04/09/2019 0.5  0.1 - 1.0 K/uL Final  . Eosinophils Relative 04/09/2019 3  % Final  . Eosinophils Absolute 04/09/2019 0.2  0.0 - 0.5 K/uL Final  . Basophils Relative 04/09/2019 1  % Final  . Basophils Absolute 04/09/2019 0.1  0.0 - 0.1 K/uL Final  . Immature Granulocytes 04/09/2019 0  % Final  . Abs Immature Granulocytes 04/09/2019 0.01  0.00 - 0.07 K/uL Final   Performed at [facility], [place] [address] [place], [place] [address]  . Sodium 04/09/2019 139  135 - 145 mmol/L Final  . Potassium 04/09/2019 3.5  3.5 - 5.1 mmol/L Final  . Chloride 04/09/2019 99  98 - 111 mmol/L Final  . CO2 04/09/2019 31  22 - 32 mmol/L Final  . Glucose, Bld 04/09/2019 132* 70 - 99 mg/dL Final   Glucose reference range applies only to samples taken after fasting for at least 8 hours.  . BUN 04/09/2019 9  8 - 23 mg/dL Final  . Creatinine 04/09/2019 1.02* 0.44 - 1.00 mg/dL Final  . Calcium 04/09/2019 9.5  8.9 - 10.3 mg/dL Final  . Total Protein 04/09/2019 7.8  6.5 - 8.1 g/dL Final  . Albumin 04/09/2019 3.9  3.5 - 5.0 g/dL Final  . AST 04/09/2019 23  15 - 41 U/L Final  .  ALT 04/09/2019  19  0 - 44 U/L Final  . Alkaline Phosphatase 04/09/2019 64  38 - 126 U/L Final  . Total Bilirubin 04/09/2019 1.2  0.3 - 1.2 mg/dL Final  . GFR, Est Non Af Am 04/09/2019 57* >60 mL/min Final  . GFR, Est AFR Am 04/09/2019 >60  >60 mL/min Final  . Anion gap 04/09/2019 9  5 - 15 Final   Performed at [facility], [place] [address], [place], [place] [address]    DIAGNOSTIC IMAGING:  None for this visit     ASSESSMENT AND PLAN:  Hannah Frazier is a pleasant 69 y.o.  Woman with stage 1 lymphoma , diagnosed in 2011; treated with R-CHOP chemotherapy. Hannah Frazier presents to the Survivorship Clinic for surveillance and routine follow-up.   1. History of Stage 1 cancer:  Hannah Frazier is currently clinically and radiographically without evidence of disease or recurrence of lymphoma. She will follow-up in the Survivorship Clinic in 1 year with labs, history, and physical exam per surveillance protocol.  I encouraged her to call me with any questions or concerns before her next visit at the cancer center, and I would be happy to see the patient sooner, if needed.    2. Back/abd pain: chronic and stable, imaging has been negative.  She has followed up with PCP about this as well.  She knows that she can call me if it worsens, and I will order CT abdomen and pelvis for evaluation.  3. Cancer screening:  Due to Hannah Frazier's history and age, she should receive screening for skin cancers, breast cancer, colon cancer, and gynecologic cancers.  She is doing a great job keeping up with her screenings. The patient was encouraged to follow-up with her PCP for appropriate cancer screenings.   4. Health maintenance and wellness promotion: Hannah Frazier was encouraged to consume 5-7 servings of fruits and vegetables per day. The patient was also encouraged to engage in moderate to vigorous exercise for 30 minutes per day most days of the week. Hannah Frazier was instructed to limit her alcohol  consumption and continue to abstain from tobacco use.    5.  Slight creatinine elevation: We faxed her labs to her PCP to evaluate.      Dispo:  -Return to cancer center to see Survivorship NP in one year  Total encounter time: 20 minutes *    Gardenia Phlegm, NP [place] [phone]  *Total Encounter Time as defined by the Centers for Medicare and Medicaid Services includes, in addition to the face-to-face time of a patient visit (documented in the note above) non-face-to-face time: obtaining and reviewing outside history, ordering and reviewing medications, tests or procedures, care coordination (communications with other health care professionals or caregivers) and documentation in the medical record.    Note: PRIMARY CARE PROVIDER Aretta Nip, [place] [phone]

## 2019-04-09 ENCOUNTER — Telehealth: Payer: Self-pay | Admitting: *Deleted

## 2019-04-09 ENCOUNTER — Encounter: Payer: Self-pay | Admitting: Adult Health

## 2019-04-09 ENCOUNTER — Inpatient Hospital Stay: Payer: PPO | Attending: Adult Health

## 2019-04-09 ENCOUNTER — Inpatient Hospital Stay (HOSPITAL_BASED_OUTPATIENT_CLINIC_OR_DEPARTMENT_OTHER): Payer: PPO | Admitting: Adult Health

## 2019-04-09 ENCOUNTER — Other Ambulatory Visit: Payer: Self-pay

## 2019-04-09 VITALS — BP 129/72 | HR 74 | Temp 97.6°F | Resp 18 | Ht 66.5 in | Wt 192.9 lb

## 2019-04-09 DIAGNOSIS — Z8572 Personal history of non-Hodgkin lymphomas: Secondary | ICD-10-CM

## 2019-04-09 DIAGNOSIS — Z8249 Family history of ischemic heart disease and other diseases of the circulatory system: Secondary | ICD-10-CM | POA: Insufficient documentation

## 2019-04-09 DIAGNOSIS — R197 Diarrhea, unspecified: Secondary | ICD-10-CM | POA: Insufficient documentation

## 2019-04-09 DIAGNOSIS — Z79899 Other long term (current) drug therapy: Secondary | ICD-10-CM | POA: Diagnosis not present

## 2019-04-09 DIAGNOSIS — R109 Unspecified abdominal pain: Secondary | ICD-10-CM | POA: Insufficient documentation

## 2019-04-09 DIAGNOSIS — Z8049 Family history of malignant neoplasm of other genital organs: Secondary | ICD-10-CM | POA: Insufficient documentation

## 2019-04-09 DIAGNOSIS — Z818 Family history of other mental and behavioral disorders: Secondary | ICD-10-CM | POA: Diagnosis not present

## 2019-04-09 DIAGNOSIS — Z888 Allergy status to other drugs, medicaments and biological substances status: Secondary | ICD-10-CM | POA: Diagnosis not present

## 2019-04-09 LAB — LACTATE DEHYDROGENASE: LDH: 189 U/L (ref 98–192)

## 2019-04-09 LAB — CBC WITH DIFFERENTIAL (CANCER CENTER ONLY)
Abs Immature Granulocytes: 0.01 10*3/uL (ref 0.00–0.07)
Basophils Absolute: 0.1 10*3/uL (ref 0.0–0.1)
Basophils Relative: 1 %
Eosinophils Absolute: 0.2 10*3/uL (ref 0.0–0.5)
Eosinophils Relative: 3 %
HCT: 45.5 % (ref 36.0–46.0)
Hemoglobin: 15.1 g/dL — ABNORMAL HIGH (ref 12.0–15.0)
Immature Granulocytes: 0 %
Lymphocytes Relative: 48 %
Lymphs Abs: 3.2 10*3/uL (ref 0.7–4.0)
MCH: 28.6 pg (ref 26.0–34.0)
MCHC: 33.2 g/dL (ref 30.0–36.0)
MCV: 86.2 fL (ref 80.0–100.0)
Monocytes Absolute: 0.5 10*3/uL (ref 0.1–1.0)
Monocytes Relative: 7 %
Neutro Abs: 2.7 10*3/uL (ref 1.7–7.7)
Neutrophils Relative %: 41 %
Platelet Count: 276 10*3/uL (ref 150–400)
RBC: 5.28 MIL/uL — ABNORMAL HIGH (ref 3.87–5.11)
RDW: 12.8 % (ref 11.5–15.5)
WBC Count: 6.6 10*3/uL (ref 4.0–10.5)
nRBC: 0 % (ref 0.0–0.2)

## 2019-04-09 LAB — CMP (CANCER CENTER ONLY)
ALT: 19 U/L (ref 0–44)
AST: 23 U/L (ref 15–41)
Albumin: 3.9 g/dL (ref 3.5–5.0)
Alkaline Phosphatase: 64 U/L (ref 38–126)
Anion gap: 9 (ref 5–15)
BUN: 9 mg/dL (ref 8–23)
CO2: 31 mmol/L (ref 22–32)
Calcium: 9.5 mg/dL (ref 8.9–10.3)
Chloride: 99 mmol/L (ref 98–111)
Creatinine: 1.02 mg/dL — ABNORMAL HIGH (ref 0.44–1.00)
GFR, Est AFR Am: >60 mL/min (ref >60)
GFR, Estimated: 57 mL/min — ABNORMAL LOW (ref >60)
Glucose, Bld: 132 mg/dL — ABNORMAL HIGH (ref 70–99)
Potassium: 3.5 mmol/L (ref 3.5–5.1)
Sodium: 139 mmol/L (ref 135–145)
Total Bilirubin: 1.2 mg/dL (ref 0.3–1.2)
Total Protein: 7.8 g/dL (ref 6.5–8.1)

## 2019-04-09 NOTE — Telephone Encounter (Signed)
CBC and CMP faxed over to [place] [facility] [place].

## 2019-04-12 ENCOUNTER — Encounter: Payer: PPO | Admitting: Adult Health

## 2019-04-12 ENCOUNTER — Other Ambulatory Visit: Payer: PPO

## 2019-05-03 ENCOUNTER — Ambulatory Visit
Admission: RE | Admit: 2019-05-03 | Discharge: 2019-05-03 | Disposition: A | Discharge status: Home / Self Care | Payer: PPO | Source: Ambulatory Visit | Attending: Family Medicine | Admitting: Family Medicine

## 2019-05-03 ENCOUNTER — Other Ambulatory Visit: Payer: Self-pay

## 2019-05-03 ENCOUNTER — Other Ambulatory Visit: Payer: Self-pay | Admitting: Family Medicine

## 2019-05-03 DIAGNOSIS — R921 Mammographic calcification found on diagnostic imaging of breast: Secondary | ICD-10-CM

## 2019-08-23 DIAGNOSIS — N390 Urinary tract infection, site not specified: Secondary | ICD-10-CM | POA: Diagnosis not present

## 2019-10-29 ENCOUNTER — Other Ambulatory Visit: Payer: Self-pay

## 2019-10-29 ENCOUNTER — Ambulatory Visit
Admission: RE | Admit: 2019-10-29 | Discharge: 2019-10-29 | Disposition: A | Discharge status: Home / Self Care | Payer: PPO | Source: Ambulatory Visit | Attending: Family Medicine | Admitting: Family Medicine

## 2019-10-29 DIAGNOSIS — R921 Mammographic calcification found on diagnostic imaging of breast: Secondary | ICD-10-CM | POA: Diagnosis not present

## 2019-11-26 DIAGNOSIS — F418 Other specified anxiety disorders: Secondary | ICD-10-CM | POA: Diagnosis not present

## 2019-11-26 DIAGNOSIS — E7800 Pure hypercholesterolemia, unspecified: Secondary | ICD-10-CM | POA: Diagnosis not present

## 2019-11-26 DIAGNOSIS — Z8579 Personal history of other malignant neoplasms of lymphoid, hematopoietic and related tissues: Secondary | ICD-10-CM | POA: Diagnosis not present

## 2019-11-26 DIAGNOSIS — Z0000 Encounter for general adult medical examination without abnormal findings: Secondary | ICD-10-CM | POA: Diagnosis not present

## 2019-11-26 DIAGNOSIS — E119 Type 2 diabetes mellitus without complications: Secondary | ICD-10-CM | POA: Diagnosis not present

## 2019-11-26 DIAGNOSIS — G479 Sleep disorder, unspecified: Secondary | ICD-10-CM | POA: Diagnosis not present

## 2019-11-26 DIAGNOSIS — I10 Essential (primary) hypertension: Secondary | ICD-10-CM | POA: Diagnosis not present

## 2019-11-27 DIAGNOSIS — L0100 Impetigo, unspecified: Secondary | ICD-10-CM | POA: Diagnosis not present

## 2019-11-27 DIAGNOSIS — L719 Rosacea, unspecified: Secondary | ICD-10-CM | POA: Diagnosis not present

## 2020-03-31 ENCOUNTER — Telehealth: Payer: Self-pay | Admitting: Adult Health

## 2020-03-31 NOTE — Telephone Encounter (Signed)
Rescheduled appts per [facility] schedule change. Left voicemail with cancellation details and new appt date and time.

## 2020-04-09 ENCOUNTER — Inpatient Hospital Stay: Payer: PPO

## 2020-04-09 ENCOUNTER — Encounter: Payer: PPO | Admitting: Adult Health

## 2020-04-24 ENCOUNTER — Other Ambulatory Visit: Payer: Self-pay

## 2020-04-24 ENCOUNTER — Inpatient Hospital Stay: Payer: PPO | Attending: Adult Health

## 2020-04-24 ENCOUNTER — Inpatient Hospital Stay: Payer: PPO | Admitting: Adult Health

## 2020-04-24 ENCOUNTER — Encounter: Payer: Self-pay | Admitting: Adult Health

## 2020-04-24 VITALS — BP 129/73 | HR 83 | Temp 97.7°F | Resp 20 | Ht 66.5 in | Wt 189.1 lb

## 2020-04-24 DIAGNOSIS — Z8249 Family history of ischemic heart disease and other diseases of the circulatory system: Secondary | ICD-10-CM | POA: Diagnosis not present

## 2020-04-24 DIAGNOSIS — G4489 Other headache syndrome: Secondary | ICD-10-CM | POA: Insufficient documentation

## 2020-04-24 DIAGNOSIS — R109 Unspecified abdominal pain: Secondary | ICD-10-CM | POA: Diagnosis not present

## 2020-04-24 DIAGNOSIS — Z888 Allergy status to other drugs, medicaments and biological substances status: Secondary | ICD-10-CM | POA: Diagnosis not present

## 2020-04-24 DIAGNOSIS — Z79899 Other long term (current) drug therapy: Secondary | ICD-10-CM | POA: Insufficient documentation

## 2020-04-24 DIAGNOSIS — Z8572 Personal history of non-Hodgkin lymphomas: Secondary | ICD-10-CM

## 2020-04-24 DIAGNOSIS — H538 Other visual disturbances: Secondary | ICD-10-CM | POA: Diagnosis not present

## 2020-04-24 DIAGNOSIS — R1084 Generalized abdominal pain: Secondary | ICD-10-CM | POA: Diagnosis not present

## 2020-04-24 DIAGNOSIS — Z9049 Acquired absence of other specified parts of digestive tract: Secondary | ICD-10-CM | POA: Diagnosis not present

## 2020-04-24 DIAGNOSIS — Z8049 Family history of malignant neoplasm of other genital organs: Secondary | ICD-10-CM | POA: Diagnosis not present

## 2020-04-24 DIAGNOSIS — Z8269 Family history of other diseases of the musculoskeletal system and connective tissue: Secondary | ICD-10-CM | POA: Insufficient documentation

## 2020-04-24 LAB — CMP (CANCER CENTER ONLY)
ALT: 20 U/L (ref 0–44)
AST: 23 U/L (ref 15–41)
Albumin: 4 g/dL (ref 3.5–5.0)
Alkaline Phosphatase: 82 U/L (ref 38–126)
Anion gap: 12 (ref 5–15)
BUN: 9 mg/dL (ref 8–23)
CO2: 29 mmol/L (ref 22–32)
Calcium: 9.2 mg/dL (ref 8.9–10.3)
Chloride: 100 mmol/L (ref 98–111)
Creatinine: 0.89 mg/dL (ref 0.44–1.00)
GFR, Estimated: >60 mL/min (ref >60)
Glucose, Bld: 122 mg/dL — ABNORMAL HIGH (ref 70–99)
Potassium: 3.9 mmol/L (ref 3.5–5.1)
Sodium: 141 mmol/L (ref 135–145)
Total Bilirubin: 1.4 mg/dL — ABNORMAL HIGH (ref 0.3–1.2)
Total Protein: 8 g/dL (ref 6.5–8.1)

## 2020-04-24 LAB — CBC WITH DIFFERENTIAL (CANCER CENTER ONLY)
Abs Immature Granulocytes: 0.01 10*3/uL (ref 0.00–0.07)
Basophils Absolute: 0.1 10*3/uL (ref 0.0–0.1)
Basophils Relative: 1 %
Eosinophils Absolute: 0.1 10*3/uL (ref 0.0–0.5)
Eosinophils Relative: 2 %
HCT: 43 % (ref 36.0–46.0)
Hemoglobin: 14.7 g/dL (ref 12.0–15.0)
Immature Granulocytes: 0 %
Lymphocytes Relative: 39 %
Lymphs Abs: 2.7 10*3/uL (ref 0.7–4.0)
MCH: 29.6 pg (ref 26.0–34.0)
MCHC: 34.2 g/dL (ref 30.0–36.0)
MCV: 86.5 fL (ref 80.0–100.0)
Monocytes Absolute: 0.4 10*3/uL (ref 0.1–1.0)
Monocytes Relative: 6 %
Neutro Abs: 3.5 10*3/uL (ref 1.7–7.7)
Neutrophils Relative %: 52 %
Platelet Count: 269 10*3/uL (ref 150–400)
RBC: 4.97 MIL/uL (ref 3.87–5.11)
RDW: 12.8 % (ref 11.5–15.5)
WBC Count: 6.9 10*3/uL (ref 4.0–10.5)
nRBC: 0 % (ref 0.0–0.2)

## 2020-04-24 LAB — LACTATE DEHYDROGENASE: LDH: 173 U/L (ref 98–192)

## 2020-04-24 NOTE — Progress Notes (Signed)
CLINIC:  Survivorship   REASON FOR VISIT:  Routine follow-up for history of lymphoma.   BRIEF ONCOLOGIC HISTORY:  Oncology History Overview Note  Lymphoma, high grade involving the terminal ileum   Primary site: Lymphoid Neoplasms   Staging method: AJCC 6th Edition   Clinical: Stage I signed by Heath Lark, MD on 04/23/2013  1:17 PM   Pathologic: Stage I signed by Heath Lark, MD on 04/23/2013  1:17 PM   Summary: Stage I     History of B-cell lymphoma  04/28/2009 Procedure   Colonoscopy and biopsy show possible lymphoma involving the terminal ileum   05/08/2009 Bone Marrow Biopsy   Bone marrow biopsy was negative.   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   06/06/2009 - 09/22/2009 Chemotherapy   The patient completed 6 cycles of R. CHOP chemotherapy.   06/12/2009 Imaging   PET CT scan show no evidence of residual disease.   10/16/2009 Imaging   PET CT scan show no evidence of residual disease.   06/01/2012 Procedure   Colonoscopy showed no evidence of recurrence of disease.   10/24/2012 Imaging   CT scan show no evidence of recurrence of disease.   10/11/2013 Imaging   CT scan show no evidence of disease.   04/22/2015 Imaging   CT scan show no evidence of disease.     INTERVAL HISTORY:  Ms. [name] presents to the Survivorship Clinic today for routine follow-up for her history of lymphoma from 2011.  Overall, she reports feeling quite well.  She has noted a couple of skin issues requiring antibiotics, along with upper respiratory infections, and UTIs over the past 6 months.  She notes some GI discomfort in the epigastric area that is worse with pressure and moving around.  She has also noted a return of her headaches with auras and blurred vision.    She denies night sweats, unintentional weight loss, or lymphadenopathy.  She is up to date with her cancer screenings.     REVIEW OF SYSTEMS:  Review of Systems  Constitutional: Negative for appetite change, chills, fatigue and unexpected weight change.  HENT:   Negative for hearing loss, mouth sores and trouble swallowing.   Eyes: Negative for eye problems and icterus.  Respiratory: Negative for chest tightness, cough, shortness of breath and wheezing.   Cardiovascular: Negative for chest pain and palpitations.  Gastrointestinal: Positive for abdominal pain. Negative for abdominal distention, constipation, diarrhea, nausea and vomiting.  Endocrine: Negative for hot flashes.  Genitourinary: Negative for difficulty urinating.   Musculoskeletal: Negative for arthralgias and gait problem.  Skin: Negative for itching and rash.  Neurological: Positive for headaches. Negative for dizziness, extremity weakness, gait problem, light-headedness, numbness, seizures and speech difficulty.  Hematological: Negative for adenopathy. Does not bruise/bleed easily.  Psychiatric/Behavioral: Negative for depression. The patient is not nervous/anxious.      PAST MEDICAL/SURGICAL HISTORY:  Past Medical History:  Diagnosis Date  . Anxiety 02/08/2011  . Diabetes mellitus   . Headache   . History of B-cell lymphoma 02/08/2011   Extranodal: mass in terminal ileum presenting with abdominal pain April, 2011  . Hypertension   . 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 Vision abnormalities    Past Surgical History:  Procedure Laterality Date  . ABDOMINAL HYSTERECTOMY    . COLECTOMY       ALLERGIES:  Allergies  Allergen Reactions  . Simvastatin Swelling  Tongue swelling   . Lisinopril Nausea Only     CURRENT MEDICATIONS:  Outpatient Encounter Medications as of 04/24/2020  Medication Sig Note  . Blood Glucose Monitoring Suppl (ONE TOUCH ULTRA MINI) W/DEVICE KIT  10/15/2013: Received from: External Pharmacy  . hydrochlorothiazide (HYDRODIURIL) 25 MG tablet Take 25 mg by mouth every morning.    Marland Kitchen LORazepam (ATIVAN) 1  MG tablet Take 0.5 mg by mouth at bedtime as needed (for sleep).    . ONE TOUCH ULTRA TEST test strip  10/15/2013: Received from: External Pharmacy  . ONETOUCH DELICA LANCETS 75T MISC  10/15/2013: Received from: External Pharmacy  . pravastatin (PRAVACHOL) 40 MG tablet TAKE 1 TABLET BY MOUTH EVERY DAY   . zolpidem (AMBIEN) 10 MG tablet Take 5-10 mg by mouth at bedtime as needed for sleep.  04/29/2012: .  Marland Kitchen [DISCONTINUED] aspirin 81 MG tablet Take 81 mg by mouth every evening.    . [DISCONTINUED] FLUoxetine (PROZAC) 20 MG tablet Take 20 mg by mouth daily.   . [DISCONTINUED] potassium chloride SA (K-DUR,KLOR-CON) 20 MEQ tablet Take 1 tablet (20 mEq total) by mouth 2 (two) times daily.    No facility-administered encounter medications on file as of 04/24/2020.     ONCOLOGIC FAMILY HISTORY:  Family History  Problem Relation Age of Onset  . Cancer Mother        cervical ca  . Parkinson's disease Mother   . Heart disease Father       SOCIAL HISTORY:  Social History   Socioeconomic History  . Marital status: Divorced    Spouse name: Not on file  . Number of children: Not on file  . Years of education: Not on file  . Highest education level: Not on file  Occupational History  . Not on file  Tobacco Use  . Smoking status: Never Smoker  . Smokeless tobacco: Never Used  Substance and Sexual Activity  . Alcohol use: No  . Drug use: No  . Sexual activity: Not on file  Other Topics Concern  . Not on file  Social History Narrative  . Not on file   Social Determinants of Health   Financial Resource Strain: Not on file  Food Insecurity: Not on file  Transportation Needs: Not on file  Physical Activity: Not on file  Stress: Not on file  Social Connections: Not on file  Intimate Partner Violence: Not on file      PHYSICAL EXAMINATION:  Vital Signs: Vitals:   04/24/20 1018  BP: 129/73  Pulse: 83  Resp: 20  Temp: 97.7 F (36.5 C)  SpO2: 99%   Filed Weights   04/24/20  1018  Weight: 189 lb 1.6 oz (85.8 kg)   General: Well-nourished, well-appearing female in no acute distress.Unaccompanied today. HEENT: Head is normocephalic.  Pupils equal and reactive to light. Conjunctivae clear without exudate.  Sclerae anicteric. Oral mucosa is pink, moist.  Oropharynx is pink without lesions or erythema.  Lymph: No cervical, supraclavicular, infraclavicular, or axillary lymphadenopathy noted on palpation.  Cardiovascular: Regular rate and rhythm.Marland Kitchen Respiratory: Clear to auscultation bilaterally. Chest expansion symmetric; breathing non-labored.  Breasts: bilateral breast exam is benign GI: Abdomen soft and round, non-distended. Bowel sounds normoactive. No hepatosplenomegaly.   GU: Deferred.  Neuro: No focal deficits. Steady gait.  Psych: Mood and affect normal and appropriate for situation.  Extremities: No edema. Skin: Warm and dry.   LABORATORY DATA:  Appointment on 04/24/2020  Component Date Value Ref Range Status  . Sodium 04/24/2020 141  135 - 145 mmol/L Final  . Potassium 04/24/2020 3.9  3.5 - 5.1 mmol/L Final  . Chloride 04/24/2020 100  98 - 111 mmol/L Final  . CO2 04/24/2020 29  22 - 32 mmol/L Final  . Glucose, Bld 04/24/2020 122* 70 - 99 mg/dL Final   Glucose reference range applies only to samples taken after fasting for at least 8 hours.  . BUN 04/24/2020 9  8 - 23 mg/dL Final  . Creatinine 04/24/2020 0.89  0.44 - 1.00 mg/dL Final  . Calcium 04/24/2020 9.2  8.9 - 10.3 mg/dL Final  . Total Protein 04/24/2020 8.0  6.5 - 8.1 g/dL Final  . Albumin 04/24/2020 4.0  3.5 - 5.0 g/dL Final  . AST 04/24/2020 23  15 - 41 U/L Final  . ALT 04/24/2020 20  0 - 44 U/L Final  . Alkaline Phosphatase 04/24/2020 82  38 - 126 U/L Final  . Total Bilirubin 04/24/2020 1.4* 0.3 - 1.2 mg/dL Final  . GFR, Estimated 04/24/2020 >60  >60 mL/min Final   Comment: (NOTE) Calculated using the CKD-EPI Creatinine Equation (2021)   . Anion gap 04/24/2020 12  5 - 15 Final    Performed at [facility], [place] [address] [place], [place] [address]  . WBC Count 04/24/2020 6.9  4.0 - 10.5 K/uL Final  . RBC 04/24/2020 4.97  3.87 - 5.11 MIL/uL Final  . Hemoglobin 04/24/2020 14.7  12.0 - 15.0 g/dL Final  . HCT 04/24/2020 43.0  36.0 - 46.0 % Final  . MCV 04/24/2020 86.5  80.0 - 100.0 fL Final  . MCH 04/24/2020 29.6  26.0 - 34.0 pg Final  . MCHC 04/24/2020 34.2  30.0 - 36.0 g/dL Final  . RDW 04/24/2020 12.8  11.5 - 15.5 % Final  . Platelet Count 04/24/2020 269  150 - 400 K/uL Final  . nRBC 04/24/2020 0.0  0.0 - 0.2 % Final  . Neutrophils Relative % 04/24/2020 52  % Final  . Neutro Abs 04/24/2020 3.5  1.7 - 7.7 K/uL Final  . Lymphocytes Relative 04/24/2020 39  % Final  . Lymphs Abs 04/24/2020 2.7  0.7 - 4.0 K/uL Final  . Monocytes Relative 04/24/2020 6  % Final  . Monocytes Absolute 04/24/2020 0.4  0.1 - 1.0 K/uL Final  . Eosinophils Relative 04/24/2020 2  % Final  . Eosinophils Absolute 04/24/2020 0.1  0.0 - 0.5 K/uL Final  . Basophils Relative 04/24/2020 1  % Final  . Basophils Absolute 04/24/2020 0.1  0.0 - 0.1 K/uL Final  . Immature Granulocytes 04/24/2020 0  % Final  . Abs Immature Granulocytes 04/24/2020 0.01  0.00 - 0.07 K/uL Final   Performed at [facility], [place] [address] [place], [place] [address]    DIAGNOSTIC IMAGING:  None for this visit     ASSESSMENT AND PLAN:  Ms.. Hannah Frazier is a pleasant 70 y.o.  Woman with stage 1 lymphoma , diagnosed in 2011; treated with R-CHOP chemotherapy. Ms.Nowland presents to the Survivorship Clinic for surveillance and routine follow-up.   1. History of Stage 1 cancer:  Ms. Balash is currently clinically and radiographically without evidence of disease or recurrence of lymphoma. See below about orders for evaluation of her abdominal pain.  She will follow-up in the Survivorship Clinic in 1 year with labs, history, and physical exam per surveillance protocol.  I  encouraged her to call me with any questions or concerns before her next visit at the cancer center, and I  would be happy to see the patient sooner, if needed.    2. Abdominal pain: I placed orders for CT scan to evaluate and r/o lymphoma recurrence.    3. Migraines: I recommended she work on diet, exercise, and sleep to help improve these.    4. Cancer screening:  Due to Ms. Biel's history and age, she should receive screening for skin cancers, breast cancer, colon cancer, and gynecologic cancers.  She is doing a great job keeping up with her screenings. The patient was encouraged to follow-up with her PCP for appropriate cancer screenings.   5. Health maintenance and wellness promotion: Ms. Ficek was encouraged to consume 5-7 servings of fruits and vegetables per day. The patient was also encouraged to engage in moderate to vigorous exercise for 30 minutes per day most days of the week. Ms. Doubrava was instructed to limit her alcohol consumption and continue to abstain from tobacco use.       Dispo:  -Return to cancer center to see Survivorship NP in one year -CT abdomen/pelvis  Total encounter time: 20 minutes *   Wilber Bihari, NP 04/24/20 10:46 AM Medical Oncology and Hematology [facility] [place], [place] [address] Tel. [phone]    Fax. [phone]   *Total Encounter Time as defined by the Centers for Medicare and Medicaid Services includes, in addition to the face-to-face time of a patient visit (documented in the note above) non-face-to-face time: obtaining and reviewing outside history, ordering and reviewing medications, tests or procedures, care coordination (communications with other health care professionals or caregivers) and documentation in the medical record.    Note: PRIMARY CARE PROVIDER Aretta Nip, [place] [phone]

## 2020-04-25 ENCOUNTER — Telehealth: Payer: Self-pay | Admitting: Adult Health

## 2020-04-25 NOTE — Telephone Encounter (Signed)
Scheduled per 3/31 los. Called and spoke with pt, confirmed 3/31 appt

## 2020-05-02 ENCOUNTER — Other Ambulatory Visit: Payer: Self-pay

## 2020-05-02 ENCOUNTER — Ambulatory Visit (HOSPITAL_COMMUNITY)
Admission: RE | Admit: 2020-05-02 | Discharge: 2020-05-02 | Disposition: A | Discharge status: Home / Self Care | Payer: PPO | Source: Ambulatory Visit | Attending: Adult Health | Admitting: Adult Health

## 2020-05-02 DIAGNOSIS — Z8572 Personal history of non-Hodgkin lymphomas: Secondary | ICD-10-CM | POA: Insufficient documentation

## 2020-05-02 DIAGNOSIS — K760 Fatty (change of) liver, not elsewhere classified: Secondary | ICD-10-CM | POA: Diagnosis not present

## 2020-05-02 DIAGNOSIS — R1084 Generalized abdominal pain: Secondary | ICD-10-CM | POA: Diagnosis not present

## 2020-05-02 DIAGNOSIS — R109 Unspecified abdominal pain: Secondary | ICD-10-CM | POA: Diagnosis not present

## 2020-05-02 MED ORDER — IOHEXOL 300 MG/ML  SOLN
100.0000 mL | Freq: Once | INTRAMUSCULAR | Status: AC | PRN
  Administered 2020-05-02: 100 mL via INTRAVENOUS

## 2020-05-05 ENCOUNTER — Telehealth: Payer: Self-pay | Admitting: Adult Health

## 2020-05-05 NOTE — Telephone Encounter (Signed)
Called Dr. Lavetta Nielsen office and given below message. Requested appt with Dr. Paulita Fujita as soon as possible. Faxed CT scan to Dr. Lavetta Nielsen office at [phone] to the attention of Tiffany, received confirmation. Tiffany will have Dr. Paulita Fujita review scan and then call patient for appt.

## 2020-05-05 NOTE — Telephone Encounter (Signed)
Called patient and reviewed results with her.  NO cancer noted on scans, however there is what appears to be infection or inflammation in the second and third portion of the duodenum.  I reviewed these results with Hannah Frazier in detail and recommended she f/u with Dr. Paulita Fujita about next steps.    Our office will fax these results and will reach out to Dr. Erlinda Hong office as well.    Wilber Bihari, NP

## 2020-05-06 ENCOUNTER — Telehealth: Payer: Self-pay

## 2020-05-06 NOTE — Telephone Encounter (Signed)
error 

## 2020-05-06 NOTE — Telephone Encounter (Signed)
Pt called to clarify the urgency of an appt with Dr Paulita Fujita. Per Hassan Rowan, RN, Dr Erlinda Hong office was to review CT scan ordered by Wilber Bihari, NP then Dr Erlinda Hong office would call to schedule pt based on urgency. Pt called to ask if she needs to return to [place] from her vacation. This LPN advised pt that if Dr Erlinda Hong office wanted to see pt so soon it may be in her best interest to return home. Pt verbalized thanks and understanding and states she will call Dr Erlinda Hong office back to see if she can come in 4/13.

## 2020-05-07 DIAGNOSIS — R1310 Dysphagia, unspecified: Secondary | ICD-10-CM | POA: Diagnosis not present

## 2020-05-07 DIAGNOSIS — R1013 Epigastric pain: Secondary | ICD-10-CM | POA: Diagnosis not present

## 2020-05-07 DIAGNOSIS — R9389 Abnormal findings on diagnostic imaging of other specified body structures: Secondary | ICD-10-CM | POA: Diagnosis not present

## 2020-05-19 ENCOUNTER — Encounter: Payer: Self-pay | Admitting: Adult Health

## 2020-05-19 DIAGNOSIS — R933 Abnormal findings on diagnostic imaging of other parts of digestive tract: Secondary | ICD-10-CM | POA: Diagnosis not present

## 2020-05-19 DIAGNOSIS — R1013 Epigastric pain: Secondary | ICD-10-CM | POA: Diagnosis not present

## 2020-05-19 DIAGNOSIS — K2930 Chronic superficial gastritis without bleeding: Secondary | ICD-10-CM | POA: Diagnosis not present

## 2020-05-20 ENCOUNTER — Telehealth: Payer: Self-pay

## 2020-05-20 NOTE — Telephone Encounter (Signed)
Called and scheduled appt for tomorrow at 1200, instructed to arrive 20 mins early. She verbalized understanding.  Called Dr. Lavetta Nielsen office and left a message with medical records asking for a copy of recent EGD. Left fax #.

## 2020-05-20 NOTE — Telephone Encounter (Signed)
-----   Message from Heath Lark, MD sent at 05/20/2020 11:40 AM EDT ----- Can you call and ask if she can come in at 12 pm tomorrow? 30 mins Also request copies of EGD by Dr. Paulita Fujita

## 2020-05-21 ENCOUNTER — Encounter: Payer: Self-pay | Admitting: Hematology and Oncology

## 2020-05-21 ENCOUNTER — Other Ambulatory Visit: Payer: Self-pay

## 2020-05-21 ENCOUNTER — Inpatient Hospital Stay: Payer: PPO | Attending: Adult Health | Admitting: Hematology and Oncology

## 2020-05-21 DIAGNOSIS — K760 Fatty (change of) liver, not elsewhere classified: Secondary | ICD-10-CM | POA: Insufficient documentation

## 2020-05-21 DIAGNOSIS — K2980 Duodenitis without bleeding: Secondary | ICD-10-CM | POA: Insufficient documentation

## 2020-05-21 DIAGNOSIS — K2990 Gastroduodenitis, unspecified, without bleeding: Secondary | ICD-10-CM | POA: Diagnosis not present

## 2020-05-21 DIAGNOSIS — Z8572 Personal history of non-Hodgkin lymphomas: Secondary | ICD-10-CM | POA: Insufficient documentation

## 2020-05-21 DIAGNOSIS — M47816 Spondylosis without myelopathy or radiculopathy, lumbar region: Secondary | ICD-10-CM | POA: Diagnosis not present

## 2020-05-21 DIAGNOSIS — Z79899 Other long term (current) drug therapy: Secondary | ICD-10-CM | POA: Diagnosis not present

## 2020-05-21 DIAGNOSIS — R030 Elevated blood-pressure reading, without diagnosis of hypertension: Secondary | ICD-10-CM | POA: Insufficient documentation

## 2020-05-21 DIAGNOSIS — I700 Atherosclerosis of aorta: Secondary | ICD-10-CM | POA: Insufficient documentation

## 2020-05-21 DIAGNOSIS — Z888 Allergy status to other drugs, medicaments and biological substances status: Secondary | ICD-10-CM | POA: Diagnosis not present

## 2020-05-21 DIAGNOSIS — K2970 Gastritis, unspecified, without bleeding: Secondary | ICD-10-CM | POA: Diagnosis not present

## 2020-05-21 DIAGNOSIS — F419 Anxiety disorder, unspecified: Secondary | ICD-10-CM | POA: Diagnosis not present

## 2020-05-21 MED ORDER — PANTOPRAZOLE SODIUM 40 MG PO TBEC: 40.0000 mg | DELAYED_RELEASE_TABLET | Freq: Every day | ORAL | 1 refills | Status: DC

## 2020-05-21 NOTE — Assessment & Plan Note (Signed)
She appears anxious in today's visit and likely the contributing cause of her elevated blood pressure Observe closely for now

## 2020-05-21 NOTE — Progress Notes (Signed)
[place] OFFICE PROGRESS NOTE  Patient Care Team: Rankins, Bill Salinas, MD as PCP - General (Family Medicine) O'Neal, Cassie Freer, MD as PCP - Cardiology (Cardiology) Arta Silence, MD as Consulting Physician (Gastroenterology)  ASSESSMENT & PLAN:  History of B-cell lymphoma I have reviewed her EGD report extensively with the patient I have also reviewed multiple CT imaging with the patient I suspect the patient did have gastritis either induced by stress or irregular eating habits I recommend increasing pantoprazole to 40 mg in the morning and to add Pepcid or Zantac in the evening The patient is given written advised to eat meals consistently and avoid all NSAID or aspirin containing products I plan to see her again in a month for further follow-up We also discussed the risk and benefits of PET/CT imaging  Gastritis and duodenitis Her symptoms are also consistent with gastritis and duodenitis As above, I recommend increasing the dose of pantoprazole to 40 mg in the morning and to add Pepcid and Zantac in the evening Clinically, I am not concerned about lymphoma recurrence at this point  Anxiety She appears anxious in today's visit and likely the contributing cause of her elevated blood pressure Observe closely for now   No orders of the defined types were placed in this encounter.   All questions were answered. The patient knows to call the clinic with any problems, questions or concerns. The total time spent in the appointment was 30 minutes encounter with patients including review of chart and various tests results, discussions about plan of care and coordination of care plan   Heath Lark, MD 05/21/2020 1:15 PM  INTERVAL HISTORY: Please see below for problem oriented charting. She is seen urgently due to abdominal pain, recent abnormal CT findings and concern for lymphoma recurrence I have not seen the patient for some time She has been followed at the  cancer survivorship clinic on a yearly basis Most recently, for approximately 2 months, she started to notice epigastric discomfort and nausea especially when she wakes up As soon as she eats something, she felt better The patient has changed her diet a little bit She has consistently skipped lunch in an effort to lose weight She prepares most of her meals She takes meloxicam rarely She denies taking any aspirin containing products or NSAID She has no recent bowel habit changes Has not noticed any melena or hematochezia She had CT imaging done several weeks ago which show abnormal changes in the duodenum She was referred to see Dr. Paulita Fujita who performed EGD on April 25 which showed changes consistent with gastritis She was started on 20 mg of pantoprazole However, given her history of lymphoma involving her terminal ileum, she is extremely anxious that she might have cancer recurrence and hence she is seen today She denies recent abnormal weight loss or lymphadenopathy  SUMMARY OF ONCOLOGIC HISTORY: Oncology History Overview Note  Lymphoma, high grade involving the terminal ileum   Primary site: Lymphoid Neoplasms   Staging method: AJCC 6th Edition   Clinical: Stage I signed by Heath Lark, MD on 04/23/2013  1:17 PM   Pathologic: Stage I signed by Heath Lark, MD on 04/23/2013  1:17 PM   Summary: Stage I     History of B-cell lymphoma  04/28/2009 Procedure   Colonoscopy and biopsy show possible lymphoma involving the terminal ileum   05/08/2009 Bone Marrow Biopsy   Bone marrow biopsy was negative.   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   06/06/2009 - 09/22/2009 Chemotherapy   The patient completed 6 cycles of R. CHOP chemotherapy.   06/12/2009 Imaging   PET CT scan show no evidence of residual disease.   10/16/2009 Imaging   PET CT scan show no evidence of residual disease.    06/01/2012 Procedure   Colonoscopy showed no evidence of recurrence of disease.   10/24/2012 Imaging   CT scan show no evidence of recurrence of disease.   10/11/2013 Imaging   CT scan show no evidence of disease.   04/22/2015 Imaging   CT scan show no evidence of disease.   05/02/2020 Imaging   Circumferential wall thickening involving the second and third portion of the duodenum suggestive of an infectious or inflammatory duodenitis. No evidence of obstruction or perforation.   Mild hepatic steatosis.   Aortic Atherosclerosis (ICD10-I70.0).     05/19/2020 Procedure   She underwent EGD by Dr. Paulita Fujita at [facility] Findings The examined esophagus was normal Patchy mild inflammation was found in the gastric fundus, in the gastric body and in the gastric antrum Biopsies were taken with a cold forceps for histology The exam of the stomach was otherwise normal The duodenal bulb, first portion of the duodenum, second portion of the duodenum and third portion of the duodenum were normal     REVIEW OF SYSTEMS:   Constitutional: Denies fevers, chills or abnormal weight loss Eyes: Denies blurriness of vision Ears, nose, mouth, throat, and face: Denies mucositis or sore throat Respiratory: Denies cough, dyspnea or wheezes Cardiovascular: Denies palpitation, chest discomfort or lower extremity swelling Skin: Denies abnormal skin rashes Lymphatics: Denies new lymphadenopathy or easy bruising Neurological:Denies numbness, tingling or new weaknesses Behavioral/Psych: Mood is stable, no new changes  All other systems were reviewed with the patient and are negative.  I have reviewed the past medical history, past surgical history, social history and family history with the patient and they are unchanged from previous note.  ALLERGIES:  is allergic to simvastatin and lisinopril.  MEDICATIONS:  Current Outpatient Medications  Medication Sig Dispense Refill  .  hydrochlorothiazide (HYDRODIURIL) 25 MG tablet Take 12.5 mg by mouth every morning.    Marland Kitchen LORazepam (ATIVAN) 1 MG tablet Take 0.5 mg by mouth at bedtime as needed (for sleep).     . pantoprazole (PROTONIX) 40 MG tablet Take 1 tablet (40 mg total) by mouth daily. 90 tablet 1  . pravastatin (PRAVACHOL) 40 MG tablet TAKE 1 TABLET BY MOUTH EVERY DAY    . zolpidem (AMBIEN) 10 MG tablet Take 5-10 mg by mouth at bedtime as needed for sleep.      No current facility-administered medications for this visit.    PHYSICAL EXAMINATION: ECOG PERFORMANCE STATUS: 1 - Symptomatic but completely ambulatory  Vitals:   05/21/20 1151  BP: (!) 144/107  Pulse: 96  Resp: 17  Temp: (!) 97.4 F (36.3 C)  SpO2: 99%   Filed Weights   05/21/20 1151  Weight: 191 lb 3.2 oz (86.7 kg)    GENERAL:alert, no distress and comfortable SKIN: skin color, texture, turgor are normal, no rashes or significant lesions EYES: normal, Conjunctiva are pink and non-injected, sclera clear OROPHARYNX:no exudate, no erythema and lips, buccal mucosa, and tongue normal  NECK: supple, thyroid normal size, non-tender, without nodularity LYMPH:  no palpable lymphadenopathy in the cervical, axillary or inguinal LUNGS: clear to auscultation and percussion with normal breathing effort HEART: regular rate &  rhythm and no murmurs and no lower extremity edema ABDOMEN:abdomen soft, non-tender and normal bowel sounds Musculoskeletal:no cyanosis of digits and no clubbing  NEURO: alert & oriented x 3 with fluent speech, no focal motor/sensory deficits  LABORATORY DATA:  I have reviewed the data as listed    Component Value Date/Time   NA 141 04/24/2020 1001   NA 139 04/08/2016 1115   K 3.9 04/24/2020 1001   K 3.7 04/08/2016 1115   CL 100 04/24/2020 1001   CL 103 04/10/2012 0824   CO2 29 04/24/2020 1001   CO2 31 (H) 04/08/2016 1115   GLUCOSE 122 (H) 04/24/2020 1001   GLUCOSE 111 04/08/2016 1115   GLUCOSE 111 (H) 04/10/2012 0824    BUN 9 04/24/2020 1001   BUN 12.8 04/08/2016 1115   CREATININE 0.89 04/24/2020 1001   CREATININE 0.9 04/08/2016 1115   CALCIUM 9.2 04/24/2020 1001   CALCIUM 9.8 04/08/2016 1115   PROT 8.0 04/24/2020 1001   PROT 8.0 04/08/2016 1115   ALBUMIN 4.0 04/24/2020 1001   ALBUMIN 4.0 04/08/2016 1115   AST 23 04/24/2020 1001   AST 22 04/08/2016 1115   ALT 20 04/24/2020 1001   ALT 23 04/08/2016 1115   ALKPHOS 82 04/24/2020 1001   ALKPHOS 79 04/08/2016 1115   BILITOT 1.4 (H) 04/24/2020 1001   BILITOT 1.23 (H) 04/08/2016 1115   GFRNONAA >60 04/24/2020 1001   GFRAA >60 04/09/2019 0758    No results found for: SPEP, UPEP  Lab Results  Component Value Date   WBC 6.9 04/24/2020   NEUTROABS 3.5 04/24/2020   HGB 14.7 04/24/2020   HCT 43.0 04/24/2020   MCV 86.5 04/24/2020   PLT 269 04/24/2020      Chemistry      Component Value Date/Time   NA 141 04/24/2020 1001   NA 139 04/08/2016 1115   K 3.9 04/24/2020 1001   K 3.7 04/08/2016 1115   CL 100 04/24/2020 1001   CL 103 04/10/2012 0824   CO2 29 04/24/2020 1001   CO2 31 (H) 04/08/2016 1115   BUN 9 04/24/2020 1001   BUN 12.8 04/08/2016 1115   CREATININE 0.89 04/24/2020 1001   CREATININE 0.9 04/08/2016 1115      Component Value Date/Time   CALCIUM 9.2 04/24/2020 1001   CALCIUM 9.8 04/08/2016 1115   ALKPHOS 82 04/24/2020 1001   ALKPHOS 79 04/08/2016 1115   AST 23 04/24/2020 1001   AST 22 04/08/2016 1115   ALT 20 04/24/2020 1001   ALT 23 04/08/2016 1115   BILITOT 1.4 (H) 04/24/2020 1001   BILITOT 1.23 (H) 04/08/2016 1115       RADIOGRAPHIC STUDIES: I have reviewed multiple imaging studies with the patient I have personally reviewed the radiological images as listed and agreed with the findings in the report. CT Abdomen Pelvis W Contrast  Result Date: 05/05/2020 CLINICAL DATA:  Remote history lymphoma now with acute mid abdominal pain EXAM: CT ABDOMEN AND PELVIS WITH CONTRAST TECHNIQUE: Multidetector CT imaging of the abdomen  and pelvis was performed using the standard protocol following bolus administration of intravenous contrast. CONTRAST:  19m OMNIPAQUE IOHEXOL 300 MG/ML  SOLN COMPARISON:  04/22/2015 FINDINGS: Lower chest: The visualized lung bases are clear bilaterally. The visualized heart and pericardium are unremarkable. Hepatobiliary: Mild hepatic steatosis. No enhancing liver lesion. No intra or extrahepatic biliary ductal dilation. Gallbladder unremarkable. Pancreas: Unremarkable Spleen: Unremarkable Adrenals/Urinary Tract: Adrenal glands are unremarkable. Kidneys are normal, without renal calculi, focal lesion, or hydronephrosis. Bladder  is unremarkable. Stomach/Bowel: The stomach is unremarkable. There is circumferential wall thickening involving the second and third portion of the duodenum without evidence of obstruction. This is nonspecific, but can be seen the setting of infectious or inflammatory duodenitis. The small and large bowel are unremarkable. The appendix is absent. No free intraperitoneal gas or fluid. Vascular/Lymphatic: Mild aortoiliac atherosclerotic calcification. No aortic aneurysm. No pathologic adenopathy within the abdomen and pelvis. Reproductive: Status post hysterectomy. No adnexal masses. Other: No abdominal wall hernia identified.  Rectum unremarkable. Musculoskeletal: Degenerative changes are seen within the lumbar spine. No lytic or blastic bone lesions are seen. IMPRESSION: Circumferential wall thickening involving the second and third portion of the duodenum suggestive of an infectious or inflammatory duodenitis. No evidence of obstruction or perforation. Mild hepatic steatosis. Aortic Atherosclerosis (ICD10-I70.0). Electronically Signed   By: Fidela Salisbury MD   On: 05/05/2020 03:03

## 2020-05-21 NOTE — Assessment & Plan Note (Signed)
I have reviewed her EGD report extensively with the patient I have also reviewed multiple CT imaging with the patient I suspect the patient did have gastritis either induced by stress or irregular eating habits I recommend increasing pantoprazole to 40 mg in the morning and to add Pepcid or Zantac in the evening The patient is given written advised to eat meals consistently and avoid all NSAID or aspirin containing products I plan to see her again in a month for further follow-up We also discussed the risk and benefits of PET/CT imaging

## 2020-05-21 NOTE — Assessment & Plan Note (Signed)
Her symptoms are also consistent with gastritis and duodenitis As above, I recommend increasing the dose of pantoprazole to 40 mg in the morning and to add Pepcid and Zantac in the evening Clinically, I am not concerned about lymphoma recurrence at this point

## 2020-05-22 DIAGNOSIS — K2930 Chronic superficial gastritis without bleeding: Secondary | ICD-10-CM | POA: Diagnosis not present

## 2020-05-26 ENCOUNTER — Telehealth: Payer: Self-pay

## 2020-05-26 NOTE — Telephone Encounter (Signed)
-----   Message from Heath Lark, MD sent at 05/26/2020  8:01 AM EDT ----- Can you call Dr. Erlinda Hong office for gastric biopsy results?

## 2020-05-26 NOTE — Telephone Encounter (Signed)
Request placed for biopsy results to be faxed.  Per recording may take up to 1 business day, will follow up to ensure message was received.

## 2020-05-27 DIAGNOSIS — E119 Type 2 diabetes mellitus without complications: Secondary | ICD-10-CM | POA: Diagnosis not present

## 2020-05-27 DIAGNOSIS — I10 Essential (primary) hypertension: Secondary | ICD-10-CM | POA: Diagnosis not present

## 2020-05-27 DIAGNOSIS — Z8572 Personal history of non-Hodgkin lymphomas: Secondary | ICD-10-CM | POA: Diagnosis not present

## 2020-05-27 DIAGNOSIS — E7800 Pure hypercholesterolemia, unspecified: Secondary | ICD-10-CM | POA: Diagnosis not present

## 2020-05-27 DIAGNOSIS — F418 Other specified anxiety disorders: Secondary | ICD-10-CM | POA: Diagnosis not present

## 2020-06-13 ENCOUNTER — Inpatient Hospital Stay: Payer: PPO | Admitting: Hematology and Oncology

## 2020-06-13 ENCOUNTER — Telehealth: Payer: Self-pay

## 2020-06-13 NOTE — Telephone Encounter (Signed)
Called and scheduled canceled appt for today to 5/24 at 1:20 pm. She is aware of appt time and virtual visit.

## 2020-06-17 ENCOUNTER — Encounter: Payer: Self-pay | Admitting: Hematology and Oncology

## 2020-06-17 ENCOUNTER — Inpatient Hospital Stay: Payer: PPO | Attending: Adult Health | Admitting: Hematology and Oncology

## 2020-06-17 DIAGNOSIS — R1031 Right lower quadrant pain: Secondary | ICD-10-CM | POA: Diagnosis not present

## 2020-06-17 DIAGNOSIS — Z8572 Personal history of non-Hodgkin lymphomas: Secondary | ICD-10-CM

## 2020-06-17 DIAGNOSIS — K2990 Gastroduodenitis, unspecified, without bleeding: Secondary | ICD-10-CM

## 2020-06-17 NOTE — Assessment & Plan Note (Signed)
Her symptoms are dramatically improved I reviewed with her final pathology report which confirmed gastritis She will continue high-dose proton pump inhibitor and antiacid and dietary modification

## 2020-06-17 NOTE — Assessment & Plan Note (Signed)
This has almost completely resolved Observe closely for now

## 2020-06-17 NOTE — Assessment & Plan Note (Signed)
I reviewed with the patient again documentation and test results Overall, her abdominal pain and symptoms of gastritis are 50 to 80% better since she started taking higher dose of PPI and antiacid We discussed the risk and benefits of CT imaging versus PET CT scan At the end of our discussion, were in agreement to proceed with repeat imaging study at the end of July I told her limitation of study with IV contrast I am hopeful by July, we will get IV contrast supply available If not, I will consider PET CT scan  She is in agreement with the plan of care

## 2020-06-17 NOTE — Progress Notes (Signed)
HEMATOLOGY-ONCOLOGY ELECTRONIC VISIT PROGRESS NOTE  Patient Care Team: Rankins, Bill Salinas, MD as PCP - General (Family Medicine) O'Neal, Cassie Freer, MD as PCP - Cardiology (Cardiology) Arta Silence, MD as Consulting Physician (Gastroenterology)  I connected with the patient through [place] visit  ASSESSMENT & PLAN:  History of B-cell lymphoma I reviewed with the patient again documentation and test results Overall, her abdominal pain and symptoms of gastritis are 50 to 80% better since she started taking higher dose of PPI and antiacid We discussed the risk and benefits of CT imaging versus PET CT scan At the end of our discussion, were in agreement to proceed with repeat imaging study at the end of July I told her limitation of study with IV contrast I am hopeful by July, we will get IV contrast supply available If not, I will consider PET CT scan  She is in agreement with the plan of care  Gastritis and duodenitis Her symptoms are dramatically improved I reviewed with her final pathology report which confirmed gastritis She will continue high-dose proton pump inhibitor and antiacid and dietary modification  Right lower quadrant abdominal pain This has almost completely resolved Observe closely for now   No orders of the defined types were placed in this encounter.   INTERVAL HISTORY: Please see below for problem oriented charting. The purpose of today's visit is to review recent EGD findings and symptom management Since last time I saw her and talk to her, her abdominal symptoms have improved significantly She had occasional sensation of postprandial fullness but they are not consistent Her abdominal pain has almost completely resolved Her symptoms of gastritis has improved by 50 to 80% She has numerous questions related to recent pathology report I reviewed findings of her pathology report with the patient with also plan to mail a copy of the report to  her  SUMMARY OF ONCOLOGIC HISTORY: Oncology History Overview Note  Lymphoma, high grade involving the terminal ileum   Primary site: Lymphoid Neoplasms   Staging method: AJCC 6th Edition   Clinical: Stage I signed by Heath Lark, MD on 04/23/2013  1:17 PM   Pathologic: Stage I signed by Heath Lark, MD on 04/23/2013  1:17 PM   Summary: Stage I     History of B-cell lymphoma  04/28/2009 Procedure   Colonoscopy and biopsy show possible lymphoma involving the terminal ileum   05/08/2009 Bone Marrow Biopsy   Bone marrow biopsy was negative.   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   06/06/2009 - 09/22/2009 Chemotherapy   The patient completed 6 cycles of R. CHOP chemotherapy.   06/12/2009 Imaging   PET CT scan show no evidence of residual disease.   10/16/2009 Imaging   PET CT scan show no evidence of residual disease.   06/01/2012 Procedure   Colonoscopy showed no evidence of recurrence of disease.   10/24/2012 Imaging   CT scan show no evidence of recurrence of disease.   10/11/2013 Imaging   CT scan show no evidence of disease.   04/22/2015 Imaging   CT scan show no evidence of disease.   05/02/2020 Imaging   Circumferential wall thickening involving the second and third portion of the duodenum suggestive of an infectious or inflammatory duodenitis. No evidence of obstruction or perforation.   Mild hepatic steatosis.   Aortic Atherosclerosis (ICD10-I70.0).     05/19/2020 Procedure   She underwent EGD by Dr. Paulita Fujita  at [facility] Findings The examined esophagus was normal Patchy mild inflammation was found in the gastric fundus, in the gastric body and in the gastric antrum Biopsies were taken with a cold forceps for histology The exam of the stomach was otherwise normal The duodenal bulb, first portion of the duodenum, second portion of the duodenum and  third portion of the duodenum were normal     REVIEW OF SYSTEMS:   Constitutional: Denies fevers, chills or abnormal weight loss Eyes: Denies blurriness of vision Ears, nose, mouth, throat, and face: Denies mucositis or sore throat Respiratory: Denies cough, dyspnea or wheezes Cardiovascular: Denies palpitation, chest discomfort Skin: Denies abnormal skin rashes Lymphatics: Denies new lymphadenopathy or easy bruising Neurological:Denies numbness, tingling or new weaknesses Behavioral/Psych: Mood is stable, no new changes  Extremities: No lower extremity edema All other systems were reviewed with the patient and are negative.  I have reviewed the past medical history, past surgical history, social history and family history with the patient and they are unchanged from previous note.  ALLERGIES:  is allergic to simvastatin and lisinopril.  MEDICATIONS:  Current Outpatient Medications  Medication Sig Dispense Refill  . hydrochlorothiazide (HYDRODIURIL) 25 MG tablet Take 12.5 mg by mouth every morning.    Marland Kitchen LORazepam (ATIVAN) 1 MG tablet Take 0.5 mg by mouth at bedtime as needed (for sleep).     . pantoprazole (PROTONIX) 40 MG tablet Take 1 tablet (40 mg total) by mouth daily. 90 tablet 1  . pravastatin (PRAVACHOL) 40 MG tablet TAKE 1 TABLET BY MOUTH EVERY DAY    . zolpidem (AMBIEN) 10 MG tablet Take 5-10 mg by mouth at bedtime as needed for sleep.      No current facility-administered medications for this visit.    PHYSICAL EXAMINATION: ECOG PERFORMANCE STATUS: 1 - Symptomatic but completely ambulatory  LABORATORY DATA:  I have reviewed the data as listed CMP Latest Ref Rng & Units 04/24/2020 04/09/2019 04/15/2017  Glucose 70 - 99 mg/dL 122(H) 132(H) 127  BUN 8 - 23 mg/dL '9 9 9  ' Creatinine 0.44 - 1.00 mg/dL 0.89 1.02(H) 0.91  Sodium 135 - 145 mmol/L 141 139 139  Potassium 3.5 - 5.1 mmol/L 3.9 3.5 3.8  Chloride 98 - 111 mmol/L 100 99 100  CO2 22 - 32 mmol/L '29 31 29  ' Calcium  8.9 - 10.3 mg/dL 9.2 9.5 9.6  Total Protein 6.5 - 8.1 g/dL 8.0 7.8 7.9  Total Bilirubin 0.3 - 1.2 mg/dL 1.4(H) 1.2 1.1  Alkaline Phos 38 - 126 U/L 82 64 89  AST 15 - 41 U/L '23 23 16  ' ALT 0 - 44 U/L '20 19 14    ' Lab Results  Component Value Date   WBC 6.9 04/24/2020   HGB 14.7 04/24/2020   HCT 43.0 04/24/2020   MCV 86.5 04/24/2020   PLT 269 04/24/2020   NEUTROABS 3.5 04/24/2020     I discussed the assessment and treatment plan with the patient. The patient was provided an opportunity to ask questions and all were answered. The patient agreed with the plan and demonstrated an understanding of the instructions. The patient was advised to call back or seek an in-person evaluation if the symptoms worsen or if the condition fails to improve as anticipated.    I spent 20 minutes for the appointment reviewing test results, discuss management and coordination of care.  Heath Lark, MD 06/17/2020 2:25 PM

## 2020-06-29 IMAGING — MG DIGITAL SCREENING BILATERAL MAMMOGRAM WITH TOMO AND CAD
8 series · 8 of 24 positions shown · non-contrast
Comparison: Previous exam(s).

CLINICAL DATA: Screening.

EXAM:
DIGITAL SCREENING BILATERAL MAMMOGRAM WITH TOMO AND CAD

[R MLO synth-2D]
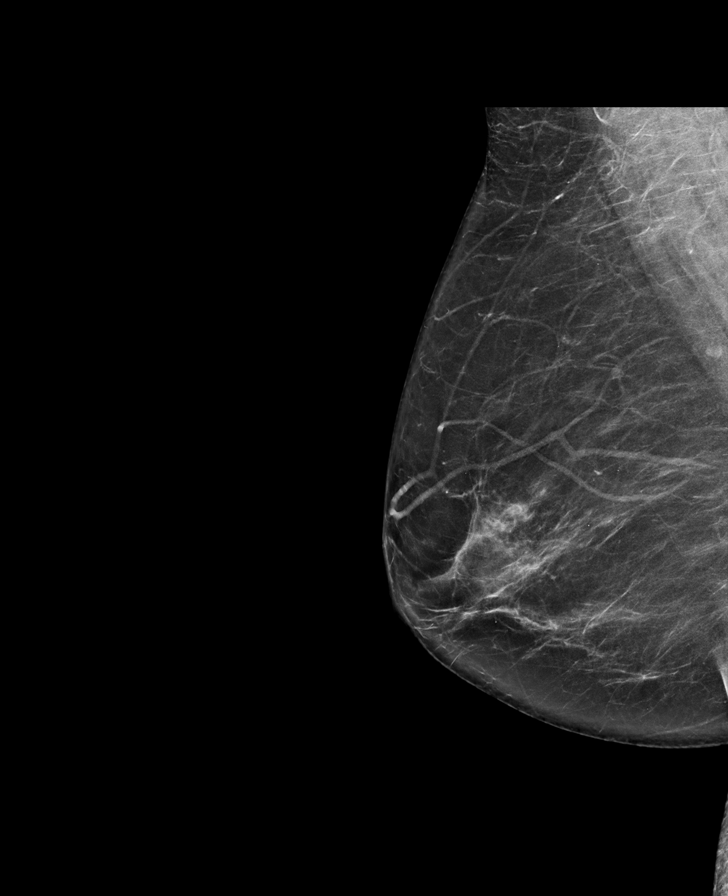

[L MLO synth-2D]
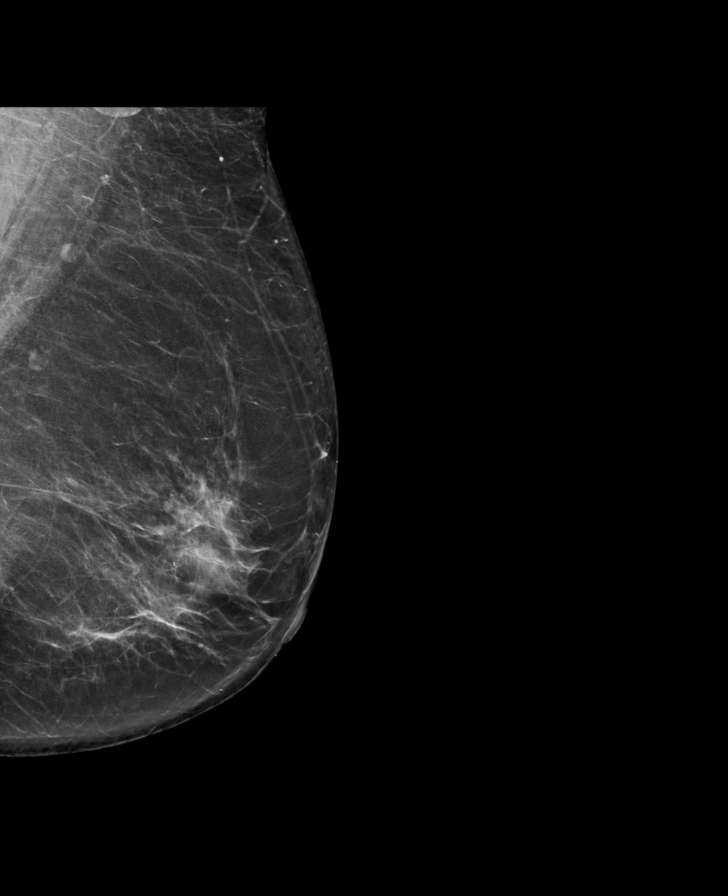

[R CC synth-2D]
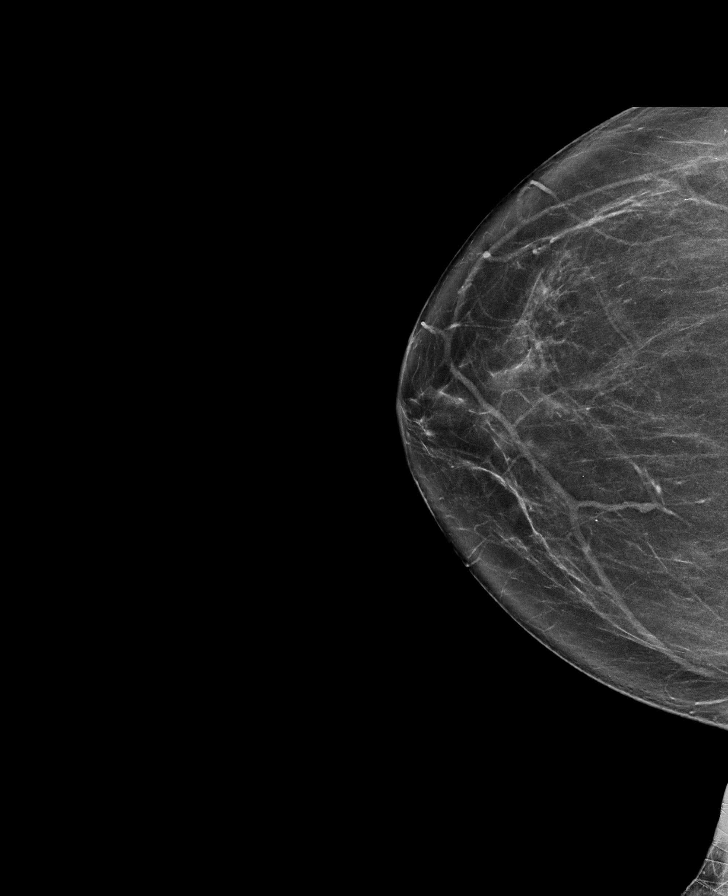

[L CC synth-2D]
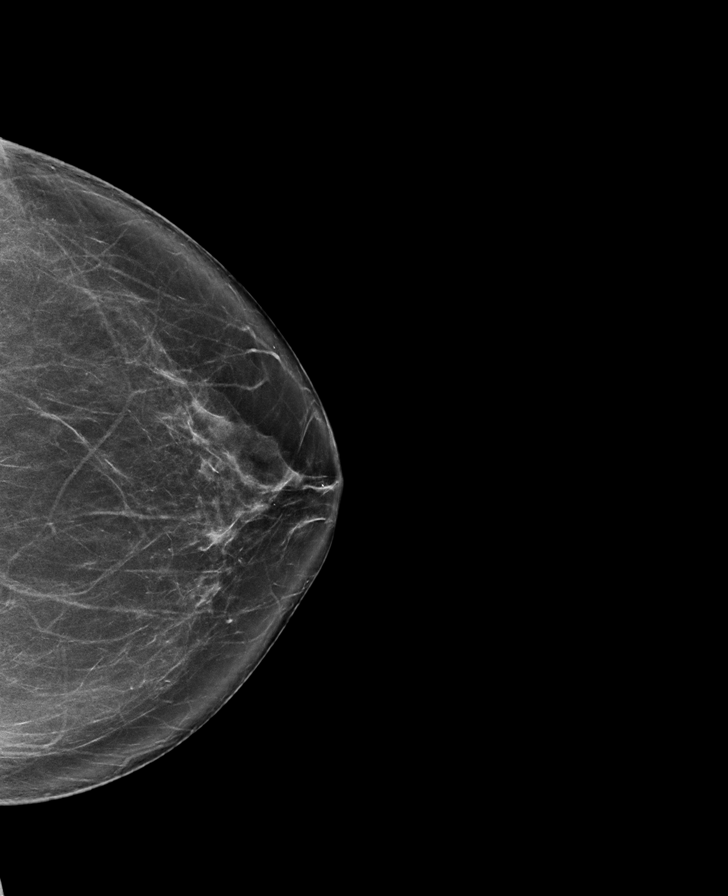

[L MLO tomo · tomo slice 39/78.0]
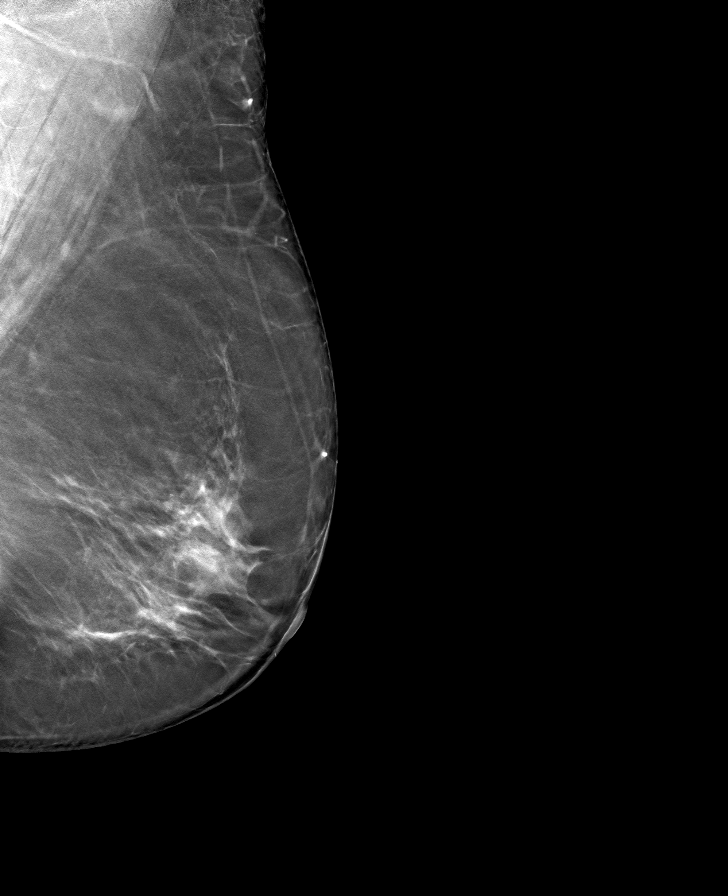

[R CC tomo · tomo slice 35/70.0]
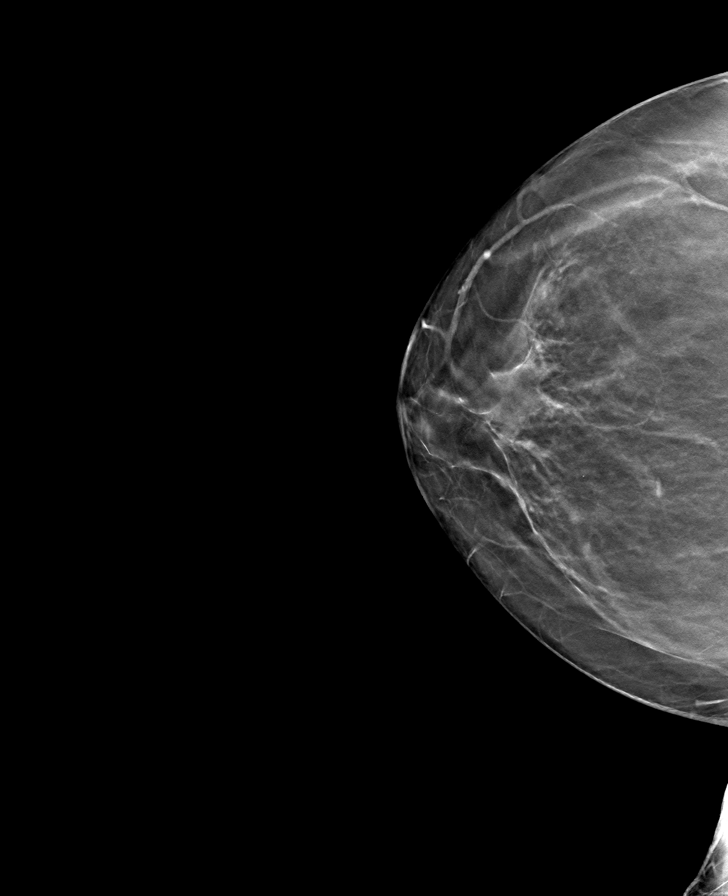

[L CC tomo · tomo slice 39/78.0]
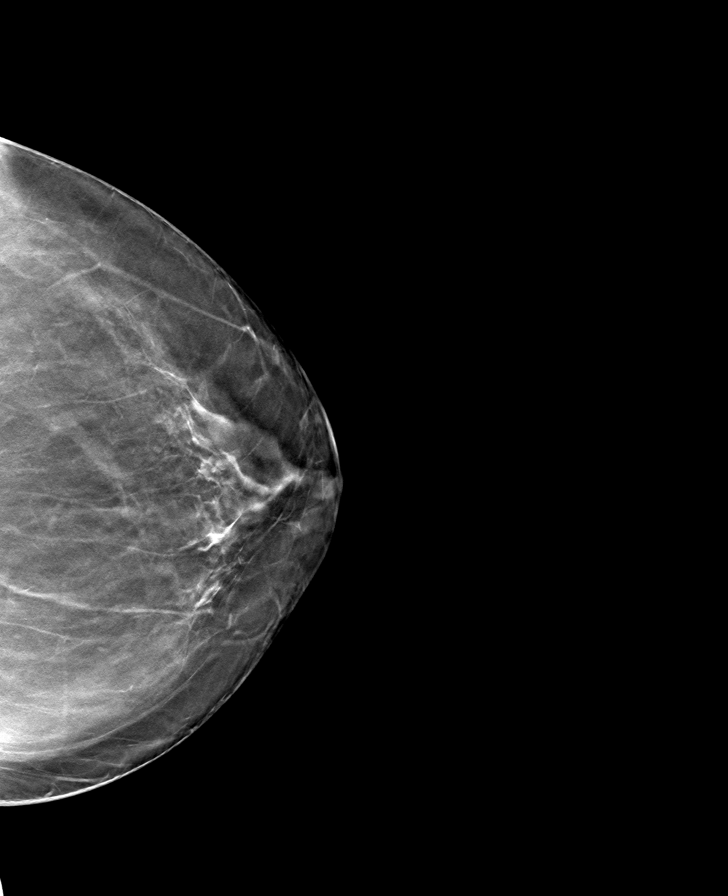

[R MLO tomo · tomo slice 38/75.0]
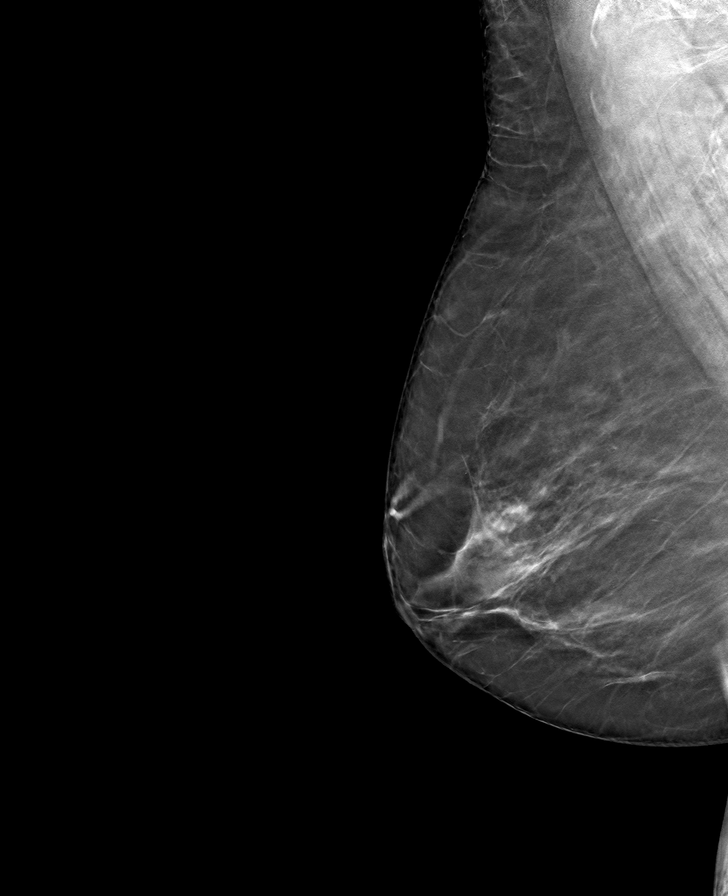

[8 of 24 positions shown; findings below may reference images not displayed]

ACR Breast Density Category b: There are scattered areas of
fibroglandular density.
FINDINGS: There are no findings suspicious for malignancy. Images were
processed with CAD.
IMPRESSION: No mammographic evidence of malignancy. A result letter of this
screening mammogram will be mailed directly to the patient.

RECOMMENDATION:
Screening mammogram in one year. (Code:[id number])

BI-RADS CATEGORY  1: Negative.

## 2020-07-21 ENCOUNTER — Other Ambulatory Visit: Payer: Self-pay | Admitting: Hematology and Oncology

## 2020-07-21 ENCOUNTER — Telehealth: Payer: Self-pay

## 2020-07-21 DIAGNOSIS — Z8572 Personal history of non-Hodgkin lymphomas: Secondary | ICD-10-CM

## 2020-07-21 NOTE — Telephone Encounter (Signed)
Called and given appt for lab on 7/20 at 8 am prior to scan. Given appt for 7/22 at 11 am with Dr. Alvy Bimler. She verbalized understanding.

## 2020-07-21 NOTE — Telephone Encounter (Signed)
-----   Message from Heath Lark, MD sent at 07/21/2020  8:40 AM EDT ----- Can you call and ask how she is doing? When I talked to her before, we decided to repeat CT scan end of July However, if her abdominal pain is completely resolved, maybe we do not need to do it If she decided not to do it, pls schedule labs and see me 20 mins end of next month If she wants it done, pls schedule labs and Ct on 7/20 and see me 7/22

## 2020-07-21 NOTE — Telephone Encounter (Signed)
Called and given below message. She verbalized understanding. Pain is less. More discomfort now. She will call and schedule scan. She is taking antacids and it causing her to have constipation. She is taking stool softeners and ask for other suggestions. Instructed to take Miralax daily to see if that helps.

## 2020-07-21 NOTE — Telephone Encounter (Signed)
Called and left a message asking her to call the office back. 

## 2020-08-10 ENCOUNTER — Other Ambulatory Visit: Payer: Self-pay

## 2020-08-10 ENCOUNTER — Ambulatory Visit
Admission: EM | Admit: 2020-08-10 | Discharge: 2020-08-10 | Disposition: A | Discharge status: Home / Self Care | Payer: PPO | Attending: Emergency Medicine | Admitting: Emergency Medicine

## 2020-08-10 ENCOUNTER — Encounter: Payer: Self-pay | Admitting: Emergency Medicine

## 2020-08-10 DIAGNOSIS — L0100 Impetigo, unspecified: Secondary | ICD-10-CM | POA: Diagnosis not present

## 2020-08-10 DIAGNOSIS — K130 Diseases of lips: Secondary | ICD-10-CM

## 2020-08-10 DIAGNOSIS — T783XXA Angioneurotic edema, initial encounter: Secondary | ICD-10-CM

## 2020-08-10 MED ORDER — MUPIROCIN 2 % EX OINT: 1.0000 "application " | TOPICAL_OINTMENT | Freq: Two times a day (BID) | CUTANEOUS | 0 refills | Status: DC

## 2020-08-10 MED ORDER — FAMOTIDINE 40 MG PO TABS
40.0000 mg | ORAL_TABLET | Freq: Once | ORAL | Status: AC
  Administered 2020-08-10: 40 mg via ORAL

## 2020-08-10 MED ORDER — METHYLPREDNISOLONE SODIUM SUCC 125 MG IJ SOLR
125.0000 mg | Freq: Once | INTRAMUSCULAR | Status: AC
  Administered 2020-08-10: 125 mg via INTRAMUSCULAR

## 2020-08-10 MED ORDER — DIPHENHYDRAMINE HCL 50 MG PO CAPS
50.0000 mg | ORAL_CAPSULE | Freq: Once | ORAL | Status: AC
  Administered 2020-08-10: 50 mg via ORAL

## 2020-08-10 MED ORDER — CEPHALEXIN 500 MG PO CAPS: 1000.0000 mg | ORAL_CAPSULE | Freq: Two times a day (BID) | ORAL | 0 refills | Status: AC

## 2020-08-10 NOTE — ED Provider Notes (Signed)
HPI  SUBJECTIVE:  Hannah Frazier is a 70 y.o. female who presents with irritation, yellowish crusting at the corners of her mouth starting 4 days ago.  She reports upper greater than lower lip swelling 3 days ago.  She also reports some soft palate swelling.  No tongue swelling, coughing, wheezing, shortness of breath, drooling, difficulty breathing, swallowing, urticaria, rash elsewhere, vomiting, diarrhea, abdominal pain, syncope dizziness/lightheadedness, voice changes.  No new foods, cosmetics, recent antibiotics.  She is not on any ACE inhibitor's.  She states that she ate some butter pecan ice cream and drink some milk about 5 days ago which she has not done in years.  She has tried Bendryl 25 mg 4 times yesterday, ice with some improvement in her symptoms.  No aggravating factors.  Family history negative for hereditary angioedema. Past medical history of diet controlled diabetes, hypertension, B-cell lymphoma.  No history of asthma, eczema, anaphylaxis, MRSA, impetigo.  EVO:[id number], Bill Salinas, MD   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   chemo comp 08/2009   Vision abnormalities     Past Surgical History:  Procedure Laterality Date   ABDOMINAL HYSTERECTOMY     COLECTOMY      Family History  Problem Relation Age of Onset   Cancer Mother        cervical ca   Parkinson's disease Mother    Heart disease Father     Social History   Tobacco Use   Smoking status: Never   Smokeless tobacco: Never  Substance Use Topics   Alcohol use: No   Drug use: No    No current facility-administered medications for this encounter.  Current Outpatient Medications:    cephALEXin (KEFLEX) 500 MG capsule, Take 2 capsules (1,000 mg total) by mouth 2 (two) times daily for 7 days.,  Disp: 28 capsule, Rfl: 0   hydrochlorothiazide (HYDRODIURIL) 25 MG tablet, Take 12.5 mg by mouth every morning., Disp: , Rfl:    LORazepam (ATIVAN) 1 MG tablet, Take 0.5 mg by mouth at bedtime as needed (for sleep). , Disp: , Rfl:    mupirocin ointment (BACTROBAN) 2 %, Apply 1 application topically 2 (two) times daily., Disp: 22 g, Rfl: 0   pantoprazole (PROTONIX) 40 MG tablet, Take 1 tablet (40 mg total) by mouth daily., Disp: 90 tablet, Rfl: 1   zolpidem (AMBIEN) 10 MG tablet, Take 5-10 mg by mouth at bedtime as needed for sleep. , Disp: , Rfl:    pravastatin (PRAVACHOL) 40 MG tablet, TAKE 1 TABLET BY MOUTH EVERY DAY, Disp: , Rfl:   Allergies  Allergen Reactions   Simvastatin Swelling    Tongue swelling    Lisinopril Nausea Only     ROS  As noted in HPI.   Physical Exam  BP 128/83   Pulse 95   Temp 98.3 F (36.8 C) (Oral)   Resp 16   SpO2 97%   Constitutional: Well developed, well nourished, no acute distress Eyes:  EOMI, conjunctiva normal bilaterally HENT: Normocephalic, atraumatic,mucus membranes moist.  Nontender angioedema of the upper and lower lip.  Positive cheilitis with honey colored crusting.  Tongue normal size.  Airway widely patent.      Respiratory: Normal inspiratory effort lungs clear bilaterally Cardiovascular: Normal rate GI: nondistended  skin: No rash, skin intact Musculoskeletal: no deformities Neurologic: Alert & oriented x 3, no focal neuro deficits Psychiatric: Speech and behavior appropriate   ED Course   Medications  methylPREDNISolone sodium succinate (SOLU-MEDROL) 125 mg/2 mL injection 125 mg (125 mg Intramuscular Given 08/10/20 1236)  famotidine (PEPCID) tablet 40 mg (40 mg Oral Given 08/10/20 1250)  diphenhydrAMINE (BENADRYL) capsule 50 mg (50 mg Oral Given 08/10/20 1236)    Orders Placed This Encounter  Procedures   Ice to affected area    Standing Status:   Standing    Number of Occurrences:   1     No results found for  this or any previous visit (from the past 24 hour(s)). No results found.  ED Clinical Impression  1. Angioedema, initial encounter   2. Impetigo   3. Cheilitis      ED Assessment/Plan  We will try treating this is an allergic reaction, although I suspect that the impetigo/cheilitis is what is causing the angioedema.  Does not appear to be anaphylaxis.  Solu-Medrol 125, Pepcid 40, Benadryl 50.  Reevaluation, patient states that she feels about the same.  No real change in appearance.  Airway still widely patent.  No respiratory distress.  Will treat as an angioedema due to the local infection/cheilitis.  Home with Keflex 1000 mg p.o. twice daily for 7 days, Bactroban.  Continue cool compresses.  States that she does not need an EpiPen.  DiscussedMDM, treatment plan, and plan for follow-up with patient. Discussed sn/sx that should prompt return to the ED. patient agrees with plan.   Meds ordered this encounter  Medications   methylPREDNISolone sodium succinate (SOLU-MEDROL) 125 mg/2 mL injection 125 mg   famotidine (PEPCID) tablet 40 mg   diphenhydrAMINE (BENADRYL) capsule 50 mg   cephALEXin (KEFLEX) 500 MG capsule    Sig: Take 2 capsules (1,000 mg total) by mouth 2 (two) times daily for 7 days.    Dispense:  28 capsule    Refill:  0   mupirocin ointment (BACTROBAN) 2 %    Sig: Apply 1 application topically 2 (two) times daily.    Dispense:  22 g    Refill:  0       *This clinic note was created using [employer]. Therefore, there may be occasional mistakes despite careful proofreading.  ?    Melynda Ripple, MD 08/11/20 1129

## 2020-08-10 NOTE — ED Triage Notes (Signed)
PT reports lip swelling started Thursday. Notes that she had half and half and pecans this week which is unusual, but she has had both of these before. No new medications.   Lip swelling with rash at sides of mouth. States her throat feels like she has a "cold" and slightly "swollen". Denies any airway changes or difficulty breathing.    She had 4 oral benadryl yesterday and 1 this morning.

## 2020-08-10 NOTE — Discharge Instructions (Addendum)
Bactroban ointment on the corners of your mouth, finish the Keflex, unless a health provider tells you to stop.  This should help with the angioedema.  Continue cool compresses.  Use an EpiPen, take 50 mg of Benadryl and go immediately to the ED if you get worse.

## 2020-08-13 ENCOUNTER — Ambulatory Visit (HOSPITAL_COMMUNITY)
Admission: RE | Admit: 2020-08-13 | Discharge: 2020-08-13 | Disposition: A | Discharge status: Home / Self Care | Payer: PPO | Source: Ambulatory Visit | Attending: Hematology and Oncology | Admitting: Hematology and Oncology

## 2020-08-13 ENCOUNTER — Other Ambulatory Visit: Payer: Self-pay

## 2020-08-13 ENCOUNTER — Encounter: Payer: Self-pay | Admitting: Hematology and Oncology

## 2020-08-13 ENCOUNTER — Inpatient Hospital Stay: Payer: PPO | Attending: Adult Health

## 2020-08-13 ENCOUNTER — Encounter (HOSPITAL_COMMUNITY): Payer: Self-pay

## 2020-08-13 ENCOUNTER — Ambulatory Visit (HOSPITAL_COMMUNITY): Payer: PPO

## 2020-08-13 DIAGNOSIS — Z888 Allergy status to other drugs, medicaments and biological substances status: Secondary | ICD-10-CM | POA: Diagnosis not present

## 2020-08-13 DIAGNOSIS — R197 Diarrhea, unspecified: Secondary | ICD-10-CM | POA: Insufficient documentation

## 2020-08-13 DIAGNOSIS — Z9049 Acquired absence of other specified parts of digestive tract: Secondary | ICD-10-CM | POA: Insufficient documentation

## 2020-08-13 DIAGNOSIS — K2970 Gastritis, unspecified, without bleeding: Secondary | ICD-10-CM | POA: Insufficient documentation

## 2020-08-13 DIAGNOSIS — K2980 Duodenitis without bleeding: Secondary | ICD-10-CM | POA: Diagnosis not present

## 2020-08-13 DIAGNOSIS — Z8572 Personal history of non-Hodgkin lymphomas: Secondary | ICD-10-CM | POA: Diagnosis not present

## 2020-08-13 DIAGNOSIS — Z79899 Other long term (current) drug therapy: Secondary | ICD-10-CM | POA: Diagnosis not present

## 2020-08-13 DIAGNOSIS — I700 Atherosclerosis of aorta: Secondary | ICD-10-CM | POA: Diagnosis not present

## 2020-08-13 DIAGNOSIS — K760 Fatty (change of) liver, not elsewhere classified: Secondary | ICD-10-CM | POA: Insufficient documentation

## 2020-08-13 DIAGNOSIS — Z9889 Other specified postprocedural states: Secondary | ICD-10-CM | POA: Diagnosis not present

## 2020-08-13 LAB — COMPREHENSIVE METABOLIC PANEL
ALT: 23 U/L (ref 0–44)
AST: 25 U/L (ref 15–41)
Albumin: 3.7 g/dL (ref 3.5–5.0)
Alkaline Phosphatase: 72 U/L (ref 38–126)
Anion gap: 9 (ref 5–15)
BUN: 11 mg/dL (ref 8–23)
CO2: 30 mmol/L (ref 22–32)
Calcium: 9.1 mg/dL (ref 8.9–10.3)
Chloride: 102 mmol/L (ref 98–111)
Creatinine, Ser: 0.94 mg/dL (ref 0.44–1.00)
GFR, Estimated: >60 mL/min (ref >60)
Glucose, Bld: 108 mg/dL — ABNORMAL HIGH (ref 70–99)
Potassium: 4.1 mmol/L (ref 3.5–5.1)
Sodium: 141 mmol/L (ref 135–145)
Total Bilirubin: 0.8 mg/dL (ref 0.3–1.2)
Total Protein: 7.6 g/dL (ref 6.5–8.1)

## 2020-08-13 LAB — CBC WITH DIFFERENTIAL/PLATELET
Abs Immature Granulocytes: 0.01 10*3/uL (ref 0.00–0.07)
Basophils Absolute: 0.1 10*3/uL (ref 0.0–0.1)
Basophils Relative: 1 %
Eosinophils Absolute: 0.2 10*3/uL (ref 0.0–0.5)
Eosinophils Relative: 2 %
HCT: 45.1 % (ref 36.0–46.0)
Hemoglobin: 14.8 g/dL (ref 12.0–15.0)
Immature Granulocytes: 0 %
Lymphocytes Relative: 47 %
Lymphs Abs: 3.9 10*3/uL (ref 0.7–4.0)
MCH: 28.8 pg (ref 26.0–34.0)
MCHC: 32.8 g/dL (ref 30.0–36.0)
MCV: 87.9 fL (ref 80.0–100.0)
Monocytes Absolute: 0.6 10*3/uL (ref 0.1–1.0)
Monocytes Relative: 7 %
Neutro Abs: 3.6 10*3/uL (ref 1.7–7.7)
Neutrophils Relative %: 43 %
Platelets: 253 10*3/uL (ref 150–400)
RBC: 5.13 MIL/uL — ABNORMAL HIGH (ref 3.87–5.11)
RDW: 12.8 % (ref 11.5–15.5)
WBC: 8.4 10*3/uL (ref 4.0–10.5)
nRBC: 0 % (ref 0.0–0.2)

## 2020-08-13 MED ORDER — IOHEXOL 350 MG/ML SOLN
80.0000 mL | Freq: Once | INTRAVENOUS | Status: AC | PRN
  Administered 2020-08-13: 80 mL via INTRAVENOUS

## 2020-08-15 ENCOUNTER — Other Ambulatory Visit: Payer: Self-pay

## 2020-08-15 ENCOUNTER — Inpatient Hospital Stay: Payer: PPO | Admitting: Hematology and Oncology

## 2020-08-15 ENCOUNTER — Encounter: Payer: Self-pay | Admitting: Hematology and Oncology

## 2020-08-15 DIAGNOSIS — K2990 Gastroduodenitis, unspecified, without bleeding: Secondary | ICD-10-CM | POA: Diagnosis not present

## 2020-08-15 DIAGNOSIS — Z8572 Personal history of non-Hodgkin lymphomas: Secondary | ICD-10-CM | POA: Diagnosis not present

## 2020-08-15 NOTE — Assessment & Plan Note (Signed)
Her symptoms has improved I recommend taper off antiacid medicine I will see her again in 6 months for further follow-up

## 2020-08-15 NOTE — Assessment & Plan Note (Signed)
I have reviewed multiple imaging studies with the patient She has no signs of cancer recurrence and is reassured I recommend taper off antiacid medicine She does not need long-term surveillance imaging study I will see her again in 6 months for further follow-up

## 2020-08-15 NOTE — Progress Notes (Signed)
[facility] OFFICE PROGRESS NOTE  Patient Care Team: Rankins, Bill Salinas, MD as PCP - General (Family Medicine) O'Neal, Cassie Freer, MD as PCP - Cardiology (Cardiology) Arta Silence, MD as Consulting Physician (Gastroenterology)  ASSESSMENT & PLAN:  History of B-cell lymphoma I have reviewed multiple imaging studies with the patient She has no signs of cancer recurrence and is reassured I recommend taper off antiacid medicine She does not need long-term surveillance imaging study I will see her again in 6 months for further follow-up  Gastritis and duodenitis Her symptoms has improved I recommend taper off antiacid medicine I will see her again in 6 months for further follow-up  No orders of the defined types were placed in this encounter.   All questions were answered. The patient knows to call the clinic with any problems, questions or concerns. The total time spent in the appointment was 20 minutes encounter with patients including review of chart and various tests results, discussions about plan of care and coordination of care plan   Heath Lark, MD 08/15/2020 11:27 AM  INTERVAL HISTORY: Please see below for problem oriented charting. She returns for further follow-up She is doing better She denies abdominal pain, nausea or recent changes in bowel habits She had mild loose stool after recent CT scan which is not unexpected She is taking antibiotic for her skin breakout Her appetite is good She has no recent weight loss  SUMMARY OF ONCOLOGIC HISTORY: Oncology History Overview Note  Lymphoma, high grade involving the terminal ileum   Primary site: Lymphoid Neoplasms   Staging method: AJCC 6th Edition   Clinical: Stage I signed by Heath Lark, MD on 04/23/2013  1:17 PM   Pathologic: Stage I signed by Heath Lark, MD on 04/23/2013  1:17 PM   Summary: Stage I      History of B-cell lymphoma  04/28/2009 Procedure   Colonoscopy and biopsy show possible  lymphoma involving the terminal ileum    05/08/2009 Bone Marrow Biopsy   Bone marrow biopsy was negative.    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    06/06/2009 - 09/22/2009 Chemotherapy   The patient completed 6 cycles of R. CHOP chemotherapy.    06/12/2009 Imaging   PET CT scan show no evidence of residual disease.    10/16/2009 Imaging   PET CT scan show no evidence of residual disease.    06/01/2012 Procedure   Colonoscopy showed no evidence of recurrence of disease.    10/24/2012 Imaging   CT scan show no evidence of recurrence of disease.    10/11/2013 Imaging   CT scan show no evidence of disease.    04/22/2015 Imaging   CT scan show no evidence of disease.    05/02/2020 Imaging   Circumferential wall thickening involving the second and third portion of the duodenum suggestive of an infectious or inflammatory duodenitis. No evidence of obstruction or perforation.   Mild hepatic steatosis.   Aortic Atherosclerosis (ICD10-I70.0).     05/19/2020 Procedure   She underwent EGD by Dr. Paulita Fujita at [facility] Findings The examined esophagus was normal Patchy mild inflammation was found in the gastric fundus, in the gastric body and in the gastric antrum Biopsies were taken with a cold forceps for histology The exam of the stomach was otherwise normal The duodenal bulb, first portion of the duodenum, second portion of the duodenum and third  portion of the duodenum were normal   08/13/2020 Imaging   1. No findings in the abdomen or pelvis to suggest recurrence of patient's lymphoma. 2. Resolution of previously visualized circumferential wall thickening involving the duodenum. 3. Moderate volume of formed stool throughout the colon. 4.  Aortic Atherosclerosis (ICD10-I70.0).       REVIEW OF SYSTEMS:   Constitutional: Denies fevers, chills or  abnormal weight loss Eyes: Denies blurriness of vision Ears, nose, mouth, throat, and face: Denies mucositis or sore throat Respiratory: Denies cough, dyspnea or wheezes Cardiovascular: Denies palpitation, chest discomfort or lower extremity swelling Gastrointestinal:  Denies nausea, heartburn or change in bowel habits Skin: Denies abnormal skin rashes Lymphatics: Denies new lymphadenopathy or easy bruising Neurological:Denies numbness, tingling or new weaknesses Behavioral/Psych: Mood is stable, no new changes  All other systems were reviewed with the patient and are negative.  I have reviewed the past medical history, past surgical history, social history and family history with the patient and they are unchanged from previous note.  ALLERGIES:  is allergic to simvastatin and lisinopril.  MEDICATIONS:  Current Outpatient Medications  Medication Sig Dispense Refill   cephALEXin (KEFLEX) 500 MG capsule Take 2 capsules (1,000 mg total) by mouth 2 (two) times daily for 7 days. 28 capsule 0   hydrochlorothiazide (HYDRODIURIL) 25 MG tablet Take 12.5 mg by mouth every morning.     LORazepam (ATIVAN) 1 MG tablet Take 0.5 mg by mouth at bedtime as needed (for sleep).      mupirocin ointment (BACTROBAN) 2 % Apply 1 application topically 2 (two) times daily. 22 g 0   pantoprazole (PROTONIX) 40 MG tablet Take 1 tablet (40 mg total) by mouth daily. 90 tablet 1   pravastatin (PRAVACHOL) 40 MG tablet TAKE 1 TABLET BY MOUTH EVERY DAY     zolpidem (AMBIEN) 10 MG tablet Take 5-10 mg by mouth at bedtime as needed for sleep.      No current facility-administered medications for this visit.    PHYSICAL EXAMINATION: ECOG PERFORMANCE STATUS: 0 - Asymptomatic  Vitals:   08/15/20 1103  BP: (!) 157/86  Pulse: (!) 101  Resp: 18  Temp: 98 F (36.7 C)  SpO2: 98%   Filed Weights   08/15/20 1103  Weight: 188 lb 3.2 oz (85.4 kg)    GENERAL:alert, no distress and comfortable Musculoskeletal:no  cyanosis of digits and no clubbing  NEURO: alert & oriented x 3 with fluent speech, no focal motor/sensory deficits  LABORATORY DATA:  I have reviewed the data as listed    Component Value Date/Time   NA 141 08/13/2020 0809   NA 139 04/08/2016 1115   K 4.1 08/13/2020 0809   K 3.7 04/08/2016 1115   CL 102 08/13/2020 0809   CL 103 04/10/2012 0824   CO2 30 08/13/2020 0809   CO2 31 (H) 04/08/2016 1115   GLUCOSE 108 (H) 08/13/2020 0809   GLUCOSE 111 04/08/2016 1115   GLUCOSE 111 (H) 04/10/2012 0824   BUN 11 08/13/2020 0809   BUN 12.8 04/08/2016 1115   CREATININE 0.94 08/13/2020 0809   CREATININE 0.89 04/24/2020 1001   CREATININE 0.9 04/08/2016 1115   CALCIUM 9.1 08/13/2020 0809   CALCIUM 9.8 04/08/2016 1115   PROT 7.6 08/13/2020 0809   PROT 8.0 04/08/2016 1115   ALBUMIN 3.7 08/13/2020 0809   ALBUMIN 4.0 04/08/2016 1115   AST 25 08/13/2020 0809   AST 23 04/24/2020 1001   AST 22 04/08/2016 1115   ALT 23 08/13/2020 0809  ALT 20 04/24/2020 1001   ALT 23 04/08/2016 1115   ALKPHOS 72 08/13/2020 0809   ALKPHOS 79 04/08/2016 1115   BILITOT 0.8 08/13/2020 0809   BILITOT 1.4 (H) 04/24/2020 1001   BILITOT 1.23 (H) 04/08/2016 1115   GFRNONAA >60 08/13/2020 0809   GFRNONAA >60 04/24/2020 1001   GFRAA >60 04/09/2019 0758    No results found for: SPEP, UPEP  Lab Results  Component Value Date   WBC 8.4 08/13/2020   NEUTROABS 3.6 08/13/2020   HGB 14.8 08/13/2020   HCT 45.1 08/13/2020   MCV 87.9 08/13/2020   PLT 253 08/13/2020      Chemistry      Component Value Date/Time   NA 141 08/13/2020 0809   NA 139 04/08/2016 1115   K 4.1 08/13/2020 0809   K 3.7 04/08/2016 1115   CL 102 08/13/2020 0809   CL 103 04/10/2012 0824   CO2 30 08/13/2020 0809   CO2 31 (H) 04/08/2016 1115   BUN 11 08/13/2020 0809   BUN 12.8 04/08/2016 1115   CREATININE 0.94 08/13/2020 0809   CREATININE 0.89 04/24/2020 1001   CREATININE 0.9 04/08/2016 1115      Component Value Date/Time   CALCIUM 9.1  08/13/2020 0809   CALCIUM 9.8 04/08/2016 1115   ALKPHOS 72 08/13/2020 0809   ALKPHOS 79 04/08/2016 1115   AST 25 08/13/2020 0809   AST 23 04/24/2020 1001   AST 22 04/08/2016 1115   ALT 23 08/13/2020 0809   ALT 20 04/24/2020 1001   ALT 23 04/08/2016 1115   BILITOT 0.8 08/13/2020 0809   BILITOT 1.4 (H) 04/24/2020 1001   BILITOT 1.23 (H) 04/08/2016 1115       RADIOGRAPHIC STUDIES: I have reviewed multiple CT imaging with the patient I have personally reviewed the radiological images as listed and agreed with the findings in the report. CT ABDOMEN PELVIS W CONTRAST  Result Date: 08/13/2020 CLINICAL DATA:  History of non-Hodgkin's lymphoma diagnosed in 2011 with primary tumor involving the terminal ileum status post partial colectomy. EXAM: CT ABDOMEN AND PELVIS WITH CONTRAST TECHNIQUE: Multidetector CT imaging of the abdomen and pelvis was performed using the standard protocol following bolus administration of intravenous contrast. CONTRAST:  18m OMNIPAQUE IOHEXOL 350 MG/ML SOLN COMPARISON:  CT May 02, 2020 and April 21, 2015 FINDINGS: Lower chest: No acute abnormality. Hepatobiliary: No suspicious hepatic lesion. Gallbladder is unremarkable. No biliary ductal dilation. Pancreas: Within normal limits. Spleen: Normal in size without focal abnormality. Adrenals/Urinary Tract: Adrenal glands are unremarkable. Kidneys are normal, without renal calculi, solid enhancing lesion, or hydronephrosis. Bladder is unremarkable for degree of distension. Stomach/Bowel: Radiopaque enteric contrast traverses the sigmoid colon. Stomach is grossly unremarkable for degree of distension. Resolution of the previously visualized circumferential wall thickening involving the duodenum. No pathologic dilation of small bowel. Postsurgical change of partial colectomy with anastomotic sutures in the right lower quadrant. No suspicious bowel wall thickening. Moderate volume of formed stool throughout the colon.  Vascular/Lymphatic: Aortic atherosclerosis without aneurysmal dilation. No pathologically enlarged abdominal or pelvic lymph nodes. Reproductive: Status post hysterectomy. No suspicious adnexal masses. Other: No abdominopelvic ascites. Musculoskeletal: Multilevel degenerative changes spine. No aggressive lytic or blastic lesion of bone. IMPRESSION: 1. No findings in the abdomen or pelvis to suggest recurrence of patient's lymphoma. 2. Resolution of previously visualized circumferential wall thickening involving the duodenum. 3. Moderate volume of formed stool throughout the colon. 4.  Aortic Atherosclerosis (ICD10-I70.0). Electronically Signed   By: JDahlia BailiffMD  On: 08/13/2020 15:10

## 2020-11-03 ENCOUNTER — Other Ambulatory Visit: Payer: Self-pay | Admitting: Hematology and Oncology

## 2020-11-04 ENCOUNTER — Other Ambulatory Visit: Payer: Self-pay | Admitting: Family Medicine

## 2020-11-04 DIAGNOSIS — R921 Mammographic calcification found on diagnostic imaging of breast: Secondary | ICD-10-CM

## 2020-11-28 DIAGNOSIS — H2513 Age-related nuclear cataract, bilateral: Secondary | ICD-10-CM | POA: Diagnosis not present

## 2020-11-28 DIAGNOSIS — H40033 Anatomical narrow angle, bilateral: Secondary | ICD-10-CM | POA: Diagnosis not present

## 2020-12-05 ENCOUNTER — Other Ambulatory Visit: Payer: Self-pay | Admitting: Adult Health

## 2020-12-05 DIAGNOSIS — R921 Mammographic calcification found on diagnostic imaging of breast: Secondary | ICD-10-CM

## 2020-12-09 ENCOUNTER — Other Ambulatory Visit: Payer: Self-pay

## 2020-12-09 ENCOUNTER — Ambulatory Visit
Admission: RE | Admit: 2020-12-09 | Discharge: 2020-12-09 | Disposition: A | Discharge status: Home / Self Care | Payer: PPO | Source: Ambulatory Visit | Attending: Family Medicine | Admitting: Family Medicine

## 2020-12-09 DIAGNOSIS — R922 Inconclusive mammogram: Secondary | ICD-10-CM | POA: Diagnosis not present

## 2020-12-09 DIAGNOSIS — R921 Mammographic calcification found on diagnostic imaging of breast: Secondary | ICD-10-CM

## 2020-12-24 ENCOUNTER — Ambulatory Visit (INDEPENDENT_AMBULATORY_CARE_PROVIDER_SITE_OTHER): Payer: PPO | Admitting: Family Medicine

## 2020-12-24 ENCOUNTER — Other Ambulatory Visit: Payer: Self-pay

## 2020-12-24 ENCOUNTER — Encounter: Payer: Self-pay | Admitting: Family Medicine

## 2020-12-24 VITALS — BP 133/90 | HR 127 | Temp 100.1°F | Resp 18 | Ht 66.5 in | Wt 184.6 lb

## 2020-12-24 DIAGNOSIS — G479 Sleep disorder, unspecified: Secondary | ICD-10-CM | POA: Insufficient documentation

## 2020-12-24 DIAGNOSIS — R9389 Abnormal findings on diagnostic imaging of other specified body structures: Secondary | ICD-10-CM | POA: Insufficient documentation

## 2020-12-24 DIAGNOSIS — B349 Viral infection, unspecified: Secondary | ICD-10-CM | POA: Diagnosis not present

## 2020-12-24 DIAGNOSIS — E1169 Type 2 diabetes mellitus with other specified complication: Secondary | ICD-10-CM | POA: Insufficient documentation

## 2020-12-24 DIAGNOSIS — E119 Type 2 diabetes mellitus without complications: Secondary | ICD-10-CM | POA: Diagnosis not present

## 2020-12-24 DIAGNOSIS — Z7689 Persons encountering health services in other specified circumstances: Secondary | ICD-10-CM

## 2020-12-24 DIAGNOSIS — Z860100 Personal history of colon polyps, unspecified: Secondary | ICD-10-CM | POA: Insufficient documentation

## 2020-12-24 DIAGNOSIS — I10 Essential (primary) hypertension: Secondary | ICD-10-CM | POA: Insufficient documentation

## 2020-12-24 DIAGNOSIS — Z8579 Personal history of other malignant neoplasms of lymphoid, hematopoietic and related tissues: Secondary | ICD-10-CM | POA: Insufficient documentation

## 2020-12-24 DIAGNOSIS — E663 Overweight: Secondary | ICD-10-CM | POA: Insufficient documentation

## 2020-12-24 DIAGNOSIS — K2930 Chronic superficial gastritis without bleeding: Secondary | ICD-10-CM | POA: Insufficient documentation

## 2020-12-24 DIAGNOSIS — Z8572 Personal history of non-Hodgkin lymphomas: Secondary | ICD-10-CM | POA: Insufficient documentation

## 2020-12-24 DIAGNOSIS — E7800 Pure hypercholesterolemia, unspecified: Secondary | ICD-10-CM | POA: Insufficient documentation

## 2020-12-24 DIAGNOSIS — R1310 Dysphagia, unspecified: Secondary | ICD-10-CM | POA: Insufficient documentation

## 2020-12-24 DIAGNOSIS — C8589 Other specified types of non-Hodgkin lymphoma, extranodal and solid organ sites: Secondary | ICD-10-CM | POA: Insufficient documentation

## 2020-12-24 LAB — POCT GLYCOSYLATED HEMOGLOBIN (HGB A1C): Hemoglobin A1C: 6.9 % — AB (ref 4.0–5.6)

## 2020-12-24 NOTE — Progress Notes (Signed)
Pt presents to establish care, cold symptoms that started last night, symptoms include sinus pressure and sore throat Pt states she is diabetic w/reading of 6.3 in 2021 no medications started

## 2020-12-24 NOTE — Progress Notes (Signed)
New Patient Office Visit  Subjective:  Patient ID: Hannah Frazier, female    DOB: 1950/05/02  Age: 70 y.o. MRN: [id number]  CC:  Chief Complaint  Patient presents with   Establish Care    HPI Ramsey Midgett presents for to establish care. Patient also complains of cold symptoms for the past 1-2 days. Patient denies fever/chills. Patient denies known contacts or exposures.   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   chemo comp 08/2009   Vision abnormalities     Past Surgical History:  Procedure Laterality Date   ABDOMINAL HYSTERECTOMY     COLECTOMY      Family History  Problem Relation Age of Onset   Cancer Mother        cervical ca   Parkinson's disease Mother    Heart disease Father     Social History   Socioeconomic History   Marital status: Divorced    Spouse name: Not on file   Number of children: Not on file   Years of education: Not on file   Highest education level: Not on file  Occupational History   Not on file  Tobacco Use   Smoking status: Never   Smokeless tobacco: Never  Substance and Sexual Activity   Alcohol use: No   Drug use: No   Sexual activity: Not on file  Other Topics Concern   Not on file  Social History Narrative   Not on file   Social Determinants of Health   Financial Resource Strain: Not on file  Food Insecurity: Not on file  Transportation Needs: Not on file  Physical Activity: Not on file  Stress: Not on file  Social Connections: Not on file  Intimate Partner Violence: Not on file    ROS Review of Systems  Constitutional:  Negative for chills and fever.  HENT:  Positive for congestion, sinus pressure, sneezing and sore throat.   All other systems reviewed and are negative.  Objective:   Today's  Vitals: BP 133/90 (BP Location: Left Arm, Patient Position: Sitting, Cuff Size: Normal)   Pulse (!) 127   Temp 100.1 F (37.8 C)   Resp 18   Ht 5' 6.5" (1.689 m)   Wt 184 lb 9.6 oz (83.7 kg)   SpO2 95%   BMI 29.35 kg/m   Physical Exam Vitals and nursing note reviewed.  Constitutional:      General: She is not in acute distress. Cardiovascular:     Rate and Rhythm: Normal rate and regular rhythm.  Pulmonary:     Effort: Pulmonary effort is normal.     Breath sounds: Normal breath sounds.  Musculoskeletal:     Cervical back: Normal range of motion and neck supple.  Neurological:     General: No focal deficit present.     Mental Status: She is alert and oriented to person, place, and time.    Assessment & Plan:   1. Diet-controlled diabetes mellitus ([place]) Discussed dietary and activity options. Continue and monitor - POCT glycosylated hemoglobin (Hb A1C)  2. Viral syndrome Recommend conservative measures with adequate rest and appropriate fluids with tylenol/nsaids prn.   3. Encounter to establish care     Outpatient Encounter Medications as of 12/24/2020  Medication  Sig   glucose blood (ONETOUCH ULTRA) test strip USE TO TEST YOUR BLOOD SUGAR ONCE A DAY (DX:E11.9)   hydrochlorothiazide (HYDRODIURIL) 25 MG tablet Take 12.5 mg by mouth every morning.   LORazepam (ATIVAN) 1 MG tablet Take 0.5 mg by mouth at bedtime as needed (for sleep).    ONETOUCH ULTRA test strip USE TO TEST YOUR BLOOD SUGAR ONCE A DAY 90   pantoprazole (PROTONIX) 40 MG tablet TAKE 1 TABLET BY MOUTH EVERY DAY   pravastatin (PRAVACHOL) 40 MG tablet TAKE 1 TABLET BY MOUTH EVERY DAY   zolpidem (AMBIEN) 10 MG tablet Take 5-10 mg by mouth at bedtime as needed for sleep.    [DISCONTINUED] FLUoxetine (PROZAC) 20 MG tablet Take 20 mg by mouth daily.   [DISCONTINUED] mupirocin ointment (BACTROBAN) 2 % Apply 1 application topically 2 (two) times daily.   [DISCONTINUED] potassium chloride SA (K-DUR,KLOR-CON) 20  MEQ tablet Take 1 tablet (20 mEq total) by mouth 2 (two) times daily.   No facility-administered encounter medications on file as of 12/24/2020.    Follow-up: No follow-ups on file.   Becky Sax, MD

## 2020-12-29 ENCOUNTER — Encounter: Payer: Self-pay | Admitting: Family Medicine

## 2020-12-29 DIAGNOSIS — E7800 Pure hypercholesterolemia, unspecified: Secondary | ICD-10-CM

## 2020-12-30 MED ORDER — PRAVASTATIN SODIUM 40 MG PO TABS: 40.0000 mg | ORAL_TABLET | Freq: Every day | ORAL | 0 refills | Status: DC

## 2021-01-07 ENCOUNTER — Encounter: Payer: PPO | Admitting: Family Medicine

## 2021-01-07 ENCOUNTER — Encounter: Payer: Self-pay | Admitting: Family Medicine

## 2021-01-07 ENCOUNTER — Ambulatory Visit (INDEPENDENT_AMBULATORY_CARE_PROVIDER_SITE_OTHER): Payer: PPO | Admitting: Family Medicine

## 2021-01-07 ENCOUNTER — Other Ambulatory Visit: Payer: Self-pay

## 2021-01-07 VITALS — BP 149/86 | HR 71 | Temp 98.2°F | Resp 16 | Wt 184.0 lb

## 2021-01-07 DIAGNOSIS — E119 Type 2 diabetes mellitus without complications: Secondary | ICD-10-CM | POA: Diagnosis not present

## 2021-01-07 DIAGNOSIS — Z0000 Encounter for general adult medical examination without abnormal findings: Secondary | ICD-10-CM

## 2021-01-07 NOTE — Progress Notes (Signed)
Established Patient Office Visit  Subjective:  Patient ID: Hannah Frazier, female    DOB: May 10, 1950  Age: 70 y.o. MRN: [id number]  CC:  Chief Complaint  Patient presents with   Annual Exam   Medicare Wellness    HPI Hannah Frazier presents for annual physical exam. Denies acute complaints or concerns.   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   chemo comp 08/2009   Vision abnormalities     Past Surgical History:  Procedure Laterality Date   ABDOMINAL HYSTERECTOMY     COLECTOMY      Family History  Problem Relation Age of Onset   Cancer Mother        cervical ca   Parkinson's disease Mother    Heart disease Father     Social History   Socioeconomic History   Marital status: Divorced    Spouse name: Not on file   Number of children: Not on file   Years of education: Not on file   Highest education level: Not on file  Occupational History   Not on file  Tobacco Use   Smoking status: Never   Smokeless tobacco: Never  Substance and Sexual Activity   Alcohol use: No   Drug use: No   Sexual activity: Not on file  Other Topics Concern   Not on file  Social History Narrative   Not on file   Social Determinants of Health   Financial Resource Strain: Not on file  Food Insecurity: Not on file  Transportation Needs: Not on file  Physical Activity: Not on file  Stress: Not on file  Social Connections: Not on file  Intimate Partner Violence: Not on file    ROS Review of Systems  All other systems reviewed and are negative.  Objective:   Today's Vitals: BP (!) 149/86    Pulse 71    Temp 98.2 F (36.8 C) (Oral)    Resp 16    Wt 184 lb (83.5 kg)    BMI 29.26 kg/m   Physical Exam Vitals and nursing note reviewed.  Constitutional:       General: She is not in acute distress. HENT:     Head: Normocephalic and atraumatic.     Right Ear: Tympanic membrane, ear canal and external ear normal.     Left Ear: Tympanic membrane, ear canal and external ear normal.     Nose: Nose normal.     Mouth/Throat:     Mouth: Mucous membranes are moist.     Pharynx: Oropharynx is clear.  Eyes:     Conjunctiva/sclera: Conjunctivae normal.     Pupils: Pupils are equal, round, and reactive to light.  Neck:     Thyroid: No thyromegaly.  Cardiovascular:     Rate and Rhythm: Normal rate and regular rhythm.     Heart sounds: Normal heart sounds. No murmur heard. Pulmonary:     Effort: Pulmonary effort is normal. No respiratory distress.     Breath sounds: Normal breath sounds.  Abdominal:     General: There is no distension.     Palpations: Abdomen is soft. There is no mass.     Tenderness: There is no abdominal tenderness.  Musculoskeletal:  General: Normal range of motion.     Cervical back: Normal range of motion and neck supple.  Skin:    General: Skin is warm and dry.  Neurological:     General: No focal deficit present.     Mental Status: She is alert and oriented to person, place, and time.  Psychiatric:        Mood and Affect: Mood normal.        Behavior: Behavior normal.    Assessment & Plan:   1. Encounter for annual physical exam Unremarkable exam. Routine labs ordered - CMP14+EGFR - Lipid Panel    Outpatient Encounter Medications as of 01/07/2021  Medication Sig   glucose blood (ONETOUCH ULTRA) test strip USE TO TEST YOUR BLOOD SUGAR ONCE A DAY (DX:E11.9)   hydrochlorothiazide (HYDRODIURIL) 25 MG tablet Take 12.5 mg by mouth every morning.   LORazepam (ATIVAN) 1 MG tablet Take 0.5 mg by mouth at bedtime as needed (for sleep).    ONETOUCH ULTRA test strip USE TO TEST YOUR BLOOD SUGAR ONCE A DAY 90   pantoprazole (PROTONIX) 40 MG tablet TAKE 1 TABLET BY MOUTH EVERY DAY   pravastatin (PRAVACHOL) 40 MG tablet  Take 1 tablet (40 mg total) by mouth daily.   zolpidem (AMBIEN) 10 MG tablet Take 5-10 mg by mouth at bedtime as needed for sleep.    [DISCONTINUED] FLUoxetine (PROZAC) 20 MG tablet Take 20 mg by mouth daily.   [DISCONTINUED] potassium chloride SA (K-DUR,KLOR-CON) 20 MEQ tablet Take 1 tablet (20 mEq total) by mouth 2 (two) times daily.   No facility-administered encounter medications on file as of 01/07/2021.    Follow-up: No follow-ups on file.   Melene Plan, RMA

## 2021-01-07 NOTE — Progress Notes (Signed)
Patient is here for her CPE. And MWVPatient has no new concerns for provider today

## 2021-01-08 ENCOUNTER — Encounter: Payer: Self-pay | Admitting: Family Medicine

## 2021-01-08 LAB — SPECIMEN STATUS REPORT

## 2021-01-10 LAB — CMP14+EGFR
ALT: 20 IU/L (ref 0–32)
AST: 22 IU/L (ref 0–40)
Albumin/Globulin Ratio: 1.4 (ref 1.2–2.2)
Albumin: 4.3 g/dL (ref 3.8–4.8)
Alkaline Phosphatase: 65 IU/L (ref 44–121)
BUN/Creatinine Ratio: 8 — ABNORMAL LOW (ref 12–28)
BUN: 7 mg/dL — ABNORMAL LOW (ref 8–27)
Bilirubin Total: 0.8 mg/dL (ref 0.0–1.2)
CO2: 24 mmol/L (ref 20–29)
Calcium: 9.2 mg/dL (ref 8.7–10.3)
Chloride: 98 mmol/L (ref 96–106)
Creatinine, Ser: 0.88 mg/dL (ref 0.57–1.00)
Globulin, Total: 3.1 g/dL (ref 1.5–4.5)
Glucose: 133 mg/dL — ABNORMAL HIGH (ref 70–99)
Potassium: 3.3 mmol/L — ABNORMAL LOW (ref 3.5–5.2)
Sodium: 139 mmol/L (ref 134–144)
Total Protein: 7.4 g/dL (ref 6.0–8.5)
eGFR: 71 mL/min/{1.73_m2} (ref >59)

## 2021-01-10 LAB — MICROALBUMIN / CREATININE URINE RATIO
Creatinine, Urine: 82.3 mg/dL
Microalb/Creat Ratio: 5 mg/g creat (ref 0–29)
Microalbumin, Urine: 4.3 ug/mL

## 2021-01-10 LAB — LIPID PANEL
Chol/HDL Ratio: 5.5 ratio — ABNORMAL HIGH (ref 0.0–4.4)
Cholesterol, Total: 191 mg/dL (ref 100–199)
HDL: 35 mg/dL — ABNORMAL LOW (ref >39)
LDL Chol Calc (NIH): 128 mg/dL — ABNORMAL HIGH (ref 0–99)
Triglycerides: 155 mg/dL — ABNORMAL HIGH (ref 0–149)
VLDL Cholesterol Cal: 28 mg/dL (ref 5–40)

## 2021-01-16 ENCOUNTER — Encounter: Payer: Self-pay | Admitting: Family Medicine

## 2021-01-16 ENCOUNTER — Other Ambulatory Visit: Payer: Self-pay | Admitting: Family Medicine

## 2021-01-16 MED ORDER — PRAVASTATIN SODIUM 80 MG PO TABS: 80.0000 mg | ORAL_TABLET | Freq: Every day | ORAL | 0 refills | Status: DC

## 2021-01-31 ENCOUNTER — Other Ambulatory Visit: Payer: Self-pay | Admitting: Family Medicine

## 2021-02-04 ENCOUNTER — Other Ambulatory Visit: Payer: Self-pay | Admitting: Family Medicine

## 2021-02-04 ENCOUNTER — Other Ambulatory Visit (HOSPITAL_COMMUNITY): Payer: Self-pay

## 2021-02-04 ENCOUNTER — Encounter: Payer: Self-pay | Admitting: Family Medicine

## 2021-02-04 MED ORDER — PRAVASTATIN SODIUM 80 MG PO TABS
80.0000 mg | ORAL_TABLET | Freq: Every day | ORAL | 0 refills | Status: DC
  Filled 2021-02-04 – 2021-02-09 (×3): qty 90, 90d supply, fill #0

## 2021-02-04 MED ORDER — ONETOUCH ULTRA VI STRP
ORAL_STRIP | 5 refills | Status: DC
  Filled 2021-02-04: qty 100, 30d supply, fill #0
  Filled 2021-02-09: qty 100, 90d supply, fill #0

## 2021-02-04 MED ORDER — HYDROCHLOROTHIAZIDE 25 MG PO TABS
12.5000 mg | ORAL_TABLET | Freq: Every morning | ORAL | 1 refills | Status: DC
  Filled 2021-02-04: qty 45, 90d supply, fill #0

## 2021-02-05 ENCOUNTER — Other Ambulatory Visit (HOSPITAL_COMMUNITY): Payer: Self-pay

## 2021-02-07 ENCOUNTER — Other Ambulatory Visit (HOSPITAL_COMMUNITY): Payer: Self-pay

## 2021-02-07 ENCOUNTER — Other Ambulatory Visit: Payer: Self-pay | Admitting: Family Medicine

## 2021-02-07 MED ORDER — HYDROCHLOROTHIAZIDE 25 MG PO TABS
25.0000 mg | ORAL_TABLET | Freq: Every morning | ORAL | 1 refills | Status: DC
  Filled 2021-02-09: qty 90, 90d supply, fill #0
  Filled 2021-05-22: qty 90, 90d supply, fill #1

## 2021-02-09 ENCOUNTER — Other Ambulatory Visit (HOSPITAL_COMMUNITY): Payer: Self-pay

## 2021-02-09 ENCOUNTER — Other Ambulatory Visit: Payer: Self-pay | Admitting: Hematology and Oncology

## 2021-02-09 MED ORDER — PANTOPRAZOLE SODIUM 40 MG PO TBEC
40.0000 mg | DELAYED_RELEASE_TABLET | Freq: Every day | ORAL | 1 refills | Status: DC
  Filled 2021-02-09 – 2021-03-30 (×4): qty 90, 90d supply, fill #0
  Filled 2021-06-28: qty 90, 90d supply, fill #1

## 2021-02-10 ENCOUNTER — Other Ambulatory Visit (HOSPITAL_COMMUNITY): Payer: Self-pay

## 2021-02-11 ENCOUNTER — Telehealth: Payer: Self-pay | Admitting: Oncology

## 2021-02-11 NOTE — Telephone Encounter (Signed)
Called Hannah Frazier and rescheduled her lab and apt with Dr. Alvy Bimler for 15 minutes later on 02/16/21.  She verbalized understanding of appointment changes.

## 2021-02-13 ENCOUNTER — Other Ambulatory Visit: Payer: Self-pay

## 2021-02-13 ENCOUNTER — Ambulatory Visit (INDEPENDENT_AMBULATORY_CARE_PROVIDER_SITE_OTHER): Payer: PPO | Admitting: Family Medicine

## 2021-02-13 ENCOUNTER — Encounter: Payer: Self-pay | Admitting: Family Medicine

## 2021-02-13 ENCOUNTER — Other Ambulatory Visit (HOSPITAL_COMMUNITY): Payer: Self-pay

## 2021-02-13 DIAGNOSIS — G4700 Insomnia, unspecified: Secondary | ICD-10-CM | POA: Diagnosis not present

## 2021-02-13 MED ORDER — ZOLPIDEM TARTRATE 5 MG PO TABS
5.0000 mg | ORAL_TABLET | Freq: Every evening | ORAL | 0 refills | Status: DC | PRN
  Filled 2021-02-13: qty 30, 30d supply, fill #0

## 2021-02-13 NOTE — Progress Notes (Signed)
Virtual Visit via Telephone Note  I connected with Hannah Frazier on 02/13/21 at 10:40 AM EST by telephone and verified that I am speaking with the correct person using two identifiers.  Location: Patient: home Provider: office   I discussed the limitations, risks, security and privacy concerns of performing an evaluation and management service by telephone and the availability of in person appointments. I also discussed with the patient that there may be a patient responsible charge related to this service. The patient expressed understanding and agreed to proceed.   History of Present Illness: Insomnia that patient want prescription of ambien. She has been on it for about 12 years but does not take it every night. She has not had issues whatsoever with the med per patient.    Observations/Objective: Patient with insomnia needing prescription.  Assessment and Plan: 1. Insomnia, unspecified type Discussed options with patient in detail. Will prescribe #30 5mg  tabs of ambien that patient says should last her at least 3 months. She is willing to try alternatives that I feel is safer  e.g. trazodone and we plan to discuss that at a future visit.   Follow Up Instructions:   I discussed the assessment and treatment plan with the patient. The patient was provided an opportunity to ask questions and all were answered. The patient agreed with the plan and demonstrated an understanding of the instructions.   The patient was advised to call back or seek an in-person evaluation if the symptoms worsen or if the condition fails to improve as anticipated.  I provided 12 minutes of non-face-to-face time during this encounter.   Becky Sax, MD

## 2021-02-16 ENCOUNTER — Ambulatory Visit: Payer: PPO | Admitting: Hematology and Oncology

## 2021-02-16 ENCOUNTER — Inpatient Hospital Stay: Payer: PPO | Attending: Hematology and Oncology

## 2021-02-16 ENCOUNTER — Other Ambulatory Visit: Payer: Self-pay

## 2021-02-16 ENCOUNTER — Inpatient Hospital Stay: Payer: PPO | Admitting: Hematology and Oncology

## 2021-02-16 ENCOUNTER — Encounter: Payer: Self-pay | Admitting: Hematology and Oncology

## 2021-02-16 ENCOUNTER — Other Ambulatory Visit: Payer: PPO

## 2021-02-16 DIAGNOSIS — Z6825 Body mass index (BMI) 25.0-25.9, adult: Secondary | ICD-10-CM | POA: Insufficient documentation

## 2021-02-16 DIAGNOSIS — I700 Atherosclerosis of aorta: Secondary | ICD-10-CM | POA: Diagnosis not present

## 2021-02-16 DIAGNOSIS — Z79899 Other long term (current) drug therapy: Secondary | ICD-10-CM | POA: Insufficient documentation

## 2021-02-16 DIAGNOSIS — Z8572 Personal history of non-Hodgkin lymphomas: Secondary | ICD-10-CM

## 2021-02-16 DIAGNOSIS — Z888 Allergy status to other drugs, medicaments and biological substances status: Secondary | ICD-10-CM | POA: Diagnosis not present

## 2021-02-16 DIAGNOSIS — E663 Overweight: Secondary | ICD-10-CM | POA: Diagnosis not present

## 2021-02-16 DIAGNOSIS — K760 Fatty (change of) liver, not elsewhere classified: Secondary | ICD-10-CM | POA: Insufficient documentation

## 2021-02-16 LAB — CBC WITH DIFFERENTIAL/PLATELET
Abs Immature Granulocytes: 0 10*3/uL (ref 0.00–0.07)
Basophils Absolute: 0.1 10*3/uL (ref 0.0–0.1)
Basophils Relative: 1 %
Eosinophils Absolute: 0.2 10*3/uL (ref 0.0–0.5)
Eosinophils Relative: 3 %
HCT: 42.5 % (ref 36.0–46.0)
Hemoglobin: 14.5 g/dL (ref 12.0–15.0)
Immature Granulocytes: 0 %
Lymphocytes Relative: 44 %
Lymphs Abs: 2.5 10*3/uL (ref 0.7–4.0)
MCH: 29.3 pg (ref 26.0–34.0)
MCHC: 34.1 g/dL (ref 30.0–36.0)
MCV: 85.9 fL (ref 80.0–100.0)
Monocytes Absolute: 0.4 10*3/uL (ref 0.1–1.0)
Monocytes Relative: 7 %
Neutro Abs: 2.6 10*3/uL (ref 1.7–7.7)
Neutrophils Relative %: 45 %
Platelets: 258 10*3/uL (ref 150–400)
RBC: 4.95 MIL/uL (ref 3.87–5.11)
RDW: 13 % (ref 11.5–15.5)
WBC: 5.8 10*3/uL (ref 4.0–10.5)
nRBC: 0 % (ref 0.0–0.2)

## 2021-02-16 LAB — COMPREHENSIVE METABOLIC PANEL
ALT: 20 U/L (ref 0–44)
AST: 21 U/L (ref 15–41)
Albumin: 4.2 g/dL (ref 3.5–5.0)
Alkaline Phosphatase: 53 U/L (ref 38–126)
Anion gap: 8 (ref 5–15)
BUN: 11 mg/dL (ref 8–23)
CO2: 30 mmol/L (ref 22–32)
Calcium: 9.3 mg/dL (ref 8.9–10.3)
Chloride: 100 mmol/L (ref 98–111)
Creatinine, Ser: 1 mg/dL (ref 0.44–1.00)
GFR, Estimated: >60 mL/min (ref >60)
Glucose, Bld: 120 mg/dL — ABNORMAL HIGH (ref 70–99)
Potassium: 4.1 mmol/L (ref 3.5–5.1)
Sodium: 138 mmol/L (ref 135–145)
Total Bilirubin: 1 mg/dL (ref 0.3–1.2)
Total Protein: 8.1 g/dL (ref 6.5–8.1)

## 2021-02-16 NOTE — Progress Notes (Signed)
[facility] OFFICE PROGRESS NOTE  Patient Care Team: Dorna Mai, MD as PCP - General (Family Medicine) O'Neal, Cassie Freer, MD as PCP - Cardiology (Cardiology) Arta Silence, MD as Consulting Physician (Gastroenterology)  ASSESSMENT & PLAN:  History of B-cell lymphoma Her last imaging study show no signs of cancer recurrence She has no signs or symptoms to suggest cancer recurrence She does not need long-term surveillance imaging study I will see her again in 12 months for further follow-up  Overweight (BMI 25.0-29.9) We discussed risk factor modification We discussed importance of dietary changes and weight loss We discussed the link associated with obesity and risk of lymphoma The patient appears interested to lose some weight  No orders of the defined types were placed in this encounter.   All questions were answered. The patient knows to call the clinic with any problems, questions or concerns. The total time spent in the appointment was 20 minutes encounter with patients including review of chart and various tests results, discussions about plan of care and coordination of care plan   Heath Lark, MD 02/16/2021 11:24 AM  INTERVAL HISTORY: Please see below for problem oriented charting. she returns for treatment follow-up She is doing well Denies abdominal pain, nausea or changes in bowel habits No new lymphadenopathy or recent infection  REVIEW OF SYSTEMS:   Constitutional: Denies fevers, chills or abnormal weight loss Eyes: Denies blurriness of vision Ears, nose, mouth, throat, and face: Denies mucositis or sore throat Respiratory: Denies cough, dyspnea or wheezes Cardiovascular: Denies palpitation, chest discomfort or lower extremity swelling Gastrointestinal:  Denies nausea, heartburn or change in bowel habits Skin: Denies abnormal skin rashes Lymphatics: Denies new lymphadenopathy or easy bruising Neurological:Denies numbness, tingling or new  weaknesses Behavioral/Psych: Mood is stable, no new changes  All other systems were reviewed with the patient and are negative.  I have reviewed the past medical history, past surgical history, social history and family history with the patient and they are unchanged from previous note.  ALLERGIES:  is allergic to simvastatin, lisinopril, and sertraline hcl.  MEDICATIONS:  Current Outpatient Medications  Medication Sig Dispense Refill   hydrochlorothiazide (HYDRODIURIL) 25 MG tablet Take 1 tablet by mouth every morning. 90 tablet 1   LORazepam (ATIVAN) 1 MG tablet Take 0.5 mg by mouth at bedtime as needed (for sleep).      pantoprazole (PROTONIX) 40 MG tablet TAKE 1 TABLET BY MOUTH EVERY DAY 90 tablet 1   pravastatin (PRAVACHOL) 80 MG tablet Take 1 tablet (80 mg total) by mouth daily. 90 tablet 0   zolpidem (AMBIEN) 5 MG tablet Take 1 tablet (5 mg total) by mouth at bedtime as needed for sleep. 30 tablet 0   No current facility-administered medications for this visit.    SUMMARY OF ONCOLOGIC HISTORY: Oncology History Overview Note  Lymphoma, high grade involving the terminal ileum   Primary site: Lymphoid Neoplasms   Staging method: AJCC 6th Edition   Clinical: Stage I signed by Heath Lark, MD on 04/23/2013  1:17 PM   Pathologic: Stage I signed by Heath Lark, MD on 04/23/2013  1:17 PM   Summary: Stage I     History of B-cell lymphoma  04/28/2009 Procedure   Colonoscopy and biopsy show possible lymphoma involving the terminal ileum   05/08/2009 Bone Marrow Biopsy   Bone marrow biopsy was negative.   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  06/06/2009 - 09/22/2009 Chemotherapy   The patient completed 6 cycles of R. CHOP chemotherapy.   06/12/2009 Imaging   PET CT scan show no evidence of residual disease.   10/16/2009 Imaging   PET CT scan show no evidence of residual  disease.   06/01/2012 Procedure   Colonoscopy showed no evidence of recurrence of disease.   10/24/2012 Imaging   CT scan show no evidence of recurrence of disease.   10/11/2013 Imaging   CT scan show no evidence of disease.   04/22/2015 Imaging   CT scan show no evidence of disease.   05/02/2020 Imaging   Circumferential wall thickening involving the second and third portion of the duodenum suggestive of an infectious or inflammatory duodenitis. No evidence of obstruction or perforation.   Mild hepatic steatosis.   Aortic Atherosclerosis (ICD10-I70.0).     05/19/2020 Procedure   She underwent EGD by Dr. Paulita Fujita at [facility] Findings The examined esophagus was normal Patchy mild inflammation was found in the gastric fundus, in the gastric body and in the gastric antrum Biopsies were taken with a cold forceps for histology The exam of the stomach was otherwise normal The duodenal bulb, first portion of the duodenum, second portion of the duodenum and third portion of the duodenum were normal   08/13/2020 Imaging   1. No findings in the abdomen or pelvis to suggest recurrence of patient's lymphoma. 2. Resolution of previously visualized circumferential wall thickening involving the duodenum. 3. Moderate volume of formed stool throughout the colon. 4.  Aortic Atherosclerosis (ICD10-I70.0).       PHYSICAL EXAMINATION: ECOG PERFORMANCE STATUS: 0 - Asymptomatic  Vitals:   02/16/21 0924  BP: 138/79  Pulse: 72  Resp: 18  Temp: 97.7 F (36.5 C)  SpO2: 96%   Filed Weights   02/16/21 0924  Weight: 186 lb (84.4 kg)    GENERAL:alert, no distress and comfortable SKIN: skin color, texture, turgor are normal, no rashes or significant lesions EYES: normal, Conjunctiva are pink and non-injected, sclera clear OROPHARYNX:no exudate, no erythema and lips, buccal mucosa, and tongue normal  NECK: supple, thyroid normal size, non-tender, without nodularity LYMPH:  no  palpable lymphadenopathy in the cervical, axillary or inguinal LUNGS: clear to auscultation and percussion with normal breathing effort HEART: regular rate & rhythm and no murmurs and no lower extremity edema ABDOMEN:abdomen soft, non-tender and normal bowel sounds Musculoskeletal:no cyanosis of digits and no clubbing  NEURO: alert & oriented x 3 with fluent speech, no focal motor/sensory deficits  LABORATORY DATA:  I have reviewed the data as listed    Component Value Date/Time   NA 138 02/16/2021 0857   NA 139 01/07/2021 1118   NA 139 04/08/2016 1115   K 4.1 02/16/2021 0857   K 3.7 04/08/2016 1115   CL 100 02/16/2021 0857   CL 103 04/10/2012 0824   CO2 30 02/16/2021 0857   CO2 31 (H) 04/08/2016 1115   GLUCOSE 120 (H) 02/16/2021 0857   GLUCOSE 111 04/08/2016 1115   GLUCOSE 111 (H) 04/10/2012 0824   BUN 11 02/16/2021 0857   BUN 7 (L) 01/07/2021 1118   BUN 12.8 04/08/2016 1115   CREATININE 1.00 02/16/2021 0857   CREATININE 0.89 04/24/2020 1001   CREATININE 0.9 04/08/2016 1115   CALCIUM 9.3 02/16/2021 0857   CALCIUM 9.8 04/08/2016 1115   PROT 8.1 02/16/2021 0857   PROT 7.4 01/07/2021 1118   PROT 8.0 04/08/2016 1115   ALBUMIN 4.2 02/16/2021 0857   ALBUMIN  4.3 01/07/2021 1118   ALBUMIN 4.0 04/08/2016 1115   AST 21 02/16/2021 0857   AST 23 04/24/2020 1001   AST 22 04/08/2016 1115   ALT 20 02/16/2021 0857   ALT 20 04/24/2020 1001   ALT 23 04/08/2016 1115   ALKPHOS 53 02/16/2021 0857   ALKPHOS 79 04/08/2016 1115   BILITOT 1.0 02/16/2021 0857   BILITOT 0.8 01/07/2021 1118   BILITOT 1.4 (H) 04/24/2020 1001   BILITOT 1.23 (H) 04/08/2016 1115   GFRNONAA >60 02/16/2021 0857   GFRNONAA >60 04/24/2020 1001   GFRAA >60 04/09/2019 0758    No results found for: SPEP, UPEP  Lab Results  Component Value Date   WBC 5.8 02/16/2021   NEUTROABS 2.6 02/16/2021   HGB 14.5 02/16/2021   HCT 42.5 02/16/2021   MCV 85.9 02/16/2021   PLT 258 02/16/2021      Chemistry       Component Value Date/Time   NA 138 02/16/2021 0857   NA 139 01/07/2021 1118   NA 139 04/08/2016 1115   K 4.1 02/16/2021 0857   K 3.7 04/08/2016 1115   CL 100 02/16/2021 0857   CL 103 04/10/2012 0824   CO2 30 02/16/2021 0857   CO2 31 (H) 04/08/2016 1115   BUN 11 02/16/2021 0857   BUN 7 (L) 01/07/2021 1118   BUN 12.8 04/08/2016 1115   CREATININE 1.00 02/16/2021 0857   CREATININE 0.89 04/24/2020 1001   CREATININE 0.9 04/08/2016 1115      Component Value Date/Time   CALCIUM 9.3 02/16/2021 0857   CALCIUM 9.8 04/08/2016 1115   ALKPHOS 53 02/16/2021 0857   ALKPHOS 79 04/08/2016 1115   AST 21 02/16/2021 0857   AST 23 04/24/2020 1001   AST 22 04/08/2016 1115   ALT 20 02/16/2021 0857   ALT 20 04/24/2020 1001   ALT 23 04/08/2016 1115   BILITOT 1.0 02/16/2021 0857   BILITOT 0.8 01/07/2021 1118   BILITOT 1.4 (H) 04/24/2020 1001   BILITOT 1.23 (H) 04/08/2016 1115

## 2021-02-16 NOTE — Assessment & Plan Note (Signed)
Her last imaging study show no signs of cancer recurrence She has no signs or symptoms to suggest cancer recurrence She does not need long-term surveillance imaging study I will see her again in 12 months for further follow-up 

## 2021-02-16 NOTE — Assessment & Plan Note (Signed)
We discussed risk factor modification We discussed importance of dietary changes and weight loss We discussed the link associated with obesity and risk of lymphoma The patient appears interested to lose some weight

## 2021-02-18 ENCOUNTER — Encounter: Payer: Self-pay | Admitting: Physician Assistant

## 2021-02-18 ENCOUNTER — Other Ambulatory Visit: Payer: Self-pay

## 2021-02-18 ENCOUNTER — Other Ambulatory Visit (HOSPITAL_COMMUNITY): Payer: Self-pay

## 2021-02-18 ENCOUNTER — Ambulatory Visit: Payer: PPO | Admitting: Physician Assistant

## 2021-02-18 VITALS — BP 132/78 | HR 67 | Ht 66.0 in | Wt 186.0 lb

## 2021-02-18 DIAGNOSIS — E785 Hyperlipidemia, unspecified: Secondary | ICD-10-CM

## 2021-02-18 DIAGNOSIS — R079 Chest pain, unspecified: Secondary | ICD-10-CM

## 2021-02-18 DIAGNOSIS — E119 Type 2 diabetes mellitus without complications: Secondary | ICD-10-CM

## 2021-02-18 DIAGNOSIS — I10 Essential (primary) hypertension: Secondary | ICD-10-CM

## 2021-02-18 MED ORDER — METOPROLOL TARTRATE 100 MG PO TABS
100.0000 mg | ORAL_TABLET | ORAL | 0 refills | Status: DC
  Filled 2021-02-18: qty 1, 1d supply, fill #0

## 2021-02-18 NOTE — Patient Instructions (Addendum)
Medication Instructions:  Take Lopressor 100 mg 2 hours prior to test Hold Hydrochlorothiazide the morning of test  Your physician recommends that you continue on your current medications as directed. Please refer to the Current Medication list given to you today.   *If you need a refill on your cardiac medications before your next appointment, please call your pharmacy*   Lab Work: Your physician recommends that you return for lab work in 1 week prior to [place] If you have labs (blood work) drawn today and your tests are completely normal, you will receive your results only by: Reading (if you have MyChart) OR A paper copy in the mail If you have any lab test that is abnormal or we need to change your treatment, we will call you to review the results.   Testing/Procedures: Your physician has requested that you have cardiac CT. Cardiac computed tomography (CT) is a painless test that uses an x-ray machine to take clear, detailed pictures of your heart. For further information please visit [email or web address]. Please follow instruction sheet as given.     Your cardiac CT will be scheduled at one of the below locations:   [facility] [address] [place], [place] [address] [phone]  [place] [address] [place], [place] [address] [phone]  If scheduled at [facility], please arrive at the [facility] main entrance (entrance A) of [facility] 30 minutes prior to test start time. You can use the FREE valet parking offered at the main entrance (encouraged to control the heart rate for the test) Proceed to the [facility] Radiology Department (first floor) to check-in and test prep.  If scheduled at [facility], please arrive 15 mins early for check-in and test prep.  Please follow these instructions carefully (unless otherwise directed):  On the  Night Before the Test: Be sure to Drink plenty of water. Do not consume any caffeinated/decaffeinated beverages or chocolate 12 hours prior to your test. Do not take any antihistamines 12 hours prior to your test. If the patient has contrast allergy: Patient will need a prescription for Prednisone and very clear instructions (as follows): Prednisone 50 mg - take 13 hours prior to test Take another Prednisone 50 mg 7 hours prior to test Take another Prednisone 50 mg 1 hour prior to test Take Benadryl 50 mg 1 hour prior to test Patient must complete all four doses of above prophylactic medications. Patient will need a ride after test due to Benadryl.  On the Day of the Test: Drink plenty of water until 1 hour prior to the test. Do not eat any food 4 hours prior to the test. You may take your regular medications prior to the test.  Take metoprolol (Lopressor) two hours prior to test. HOLD Furosemide/Hydrochlorothiazide morning of the test. FEMALES- please wear underwire-free bra if available, avoid dresses & tight clothing   *For Clinical Staff only. Please instruct patient the following:* Heart Rate Medication Recommendations for Cardiac CT  Resting HR < 50 bpm  No medication  Resting HR 50-60 bpm and BP >110/50 mmHG   Consider Metoprolol tartrate 25 mg PO 90-120 min prior to scan  Resting HR 60-65 bpm and BP >110/50 mmHG  Metoprolol tartrate 50 mg PO 90-120 minutes prior to scan   Resting HR > 65 bpm and BP >110/50 mmHG  Metoprolol tartrate 100 mg PO 90-120 minutes prior to scan  Consider Ivabradine  10-15 mg PO or a calcium channel blocker for resting HR >60 bpm and contraindication to metoprolol tartrate  Consider Ivabradine 10-15 mg PO in combination with metoprolol tartrate for HR >80 bpm         After the Test: Drink plenty of water. After receiving IV contrast, you may experience a mild flushed feeling. This is normal. On occasion, you may experience a mild rash up to 24  hours after the test. This is not dangerous. If this occurs, you can take Benadryl 25 mg and increase your fluid intake. If you experience trouble breathing, this can be serious. If it is severe call 911 IMMEDIATELY. If it is mild, please call our office. If you take any of these medications: Glipizide/Metformin, Avandament, Glucavance, please do not take 48 hours after completing test unless otherwise instructed.  We will call to schedule your test 2-4 weeks out understanding that some insurance companies will need an authorization prior to the service being performed.   For non-scheduling related questions, please contact the cardiac imaging nurse navigator should you have any questions/concerns: Marchia Bond, Cardiac Imaging Nurse Navigator Gordy Clement, Cardiac Imaging Nurse Navigator [facility] Direct Office Dial: [phone]   For scheduling needs, including cancellations and rescheduling, please call [place], [phone].    Follow-Up: At [facility], you and your health needs are our priority.  As part of our continuing mission to provide you with exceptional heart care, we have created designated Provider Care Teams.  These Care Teams include your primary Cardiologist (physician) and Advanced Practice Providers (APPs -  Physician Assistants and Nurse Practitioners) who all work together to provide you with the care you need, when you need it.  We recommend signing up for the patient portal called "MyChart".  Sign up information is provided on this After Visit Summary.  MyChart is used to connect with patients for Virtual Visits (Telemedicine).  Patients are able to view lab/test results, encounter notes, upcoming appointments, etc.  Non-urgent messages can be sent to your provider as well.   To learn more about what you can do with MyChart, go to [email or web address].    Your next appointment:   6 week(s)  The format for your next appointment:    In Person  Provider:   APP or Evalina Field, MD     Other Instructions IF CORONARY CT IS NORMAL, PT MAY CANCEL HER 6 WEEKS FOLLOW UP APPOINTMENT

## 2021-02-18 NOTE — Progress Notes (Signed)
Cardiology Office Note:    Date:  02/21/2021   ID:  Rida Loudin, DOB Jun 12, 1950, MRN [id number]  PCP:  Dorna Mai, MD   [facility] Providers Cardiologist:  Evalina Field, MD     Referring MD: Dorna Mai, MD   Chief Complaint  Patient presents with   Follow-up    Seen for Dr. Audie Box    History of Present Illness:    Hannah Frazier is a 71 y.o. female with a hx of hypertension, hyperlipidemia, DM2, B-cell lymphoma and palpitation.  Patient was previously referred to Dr. Davina Poke for evaluation of palpitation.  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  Patient was last seen by Dr. Davina Poke in August 2020, given infrequent episode of rapid heartbeat, no aggressive work-up was performed.  Echocardiogram obtained on 09/14/2018 showed EF 60 to 65%, no significant valve issue.  Patient presents today for chest pain evaluation.  She describes her chest tightness radiating to the left shoulder and the left arm for the past 2 weeks.  Symptom occur more so while resting instead with exertion.  She denies any exacerbating factors such as deep inspiration, body rotation or palpation.  Physical activity does not worsen her chest pain.  Her EKG shows no obvious ischemic changes.  I recommended a coronary CT to further assess.  She has been instructed to hold her hydrochlorothiazide in the morning of the coronary CT and take her 100 mg tablet of metoprolol to tartrate 2 hours prior to the CT image.  I would recommend a 6-week follow-up, however if her coronary CT is normal, she may cancel the follow-up.   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   chemo comp 08/2009   Vision abnormalities     Past Surgical History:  Procedure Laterality Date   ABDOMINAL  HYSTERECTOMY     COLECTOMY      Current Medications: Current Meds  Medication Sig   hydrochlorothiazide (HYDRODIURIL) 25 MG tablet Take 1 tablet by mouth every morning.   LORazepam (ATIVAN) 1 MG tablet Take 0.5 mg by mouth at bedtime as needed (for sleep).    metoprolol tartrate (LOPRESSOR) 100 MG tablet Take 1 tablet (100 mg total) by mouth 2 hours prior to procedure   pantoprazole (PROTONIX) 40 MG tablet TAKE 1 TABLET BY MOUTH EVERY DAY   pravastatin (PRAVACHOL) 80 MG tablet Take 1 tablet (80 mg total) by mouth daily.   zolpidem (AMBIEN) 5 MG tablet Take 1 tablet (5 mg total) by mouth at bedtime as needed for sleep.     Allergies:   Simvastatin, Lisinopril, and Sertraline hcl   Social History   Socioeconomic History   Marital status: Divorced    Spouse name: Not on file   Number of children: Not on file   Years of education: Not on file   Highest education level: Not on file  Occupational History   Not on file  Tobacco Use   Smoking status: Never   Smokeless tobacco: Never  Substance and Sexual Activity   Alcohol use: No   Drug use: No   Sexual activity: Not on file  Other Topics Concern   Not on file  Social History Narrative   Not on file   Social Determinants of  Health   Financial Resource Strain: Not on file  Food Insecurity: Not on file  Transportation Needs: Not on file  Physical Activity: Not on file  Stress: Not on file  Social Connections: Not on file     Family History: The patient's family history includes Cancer in her mother; Heart disease in her father; Parkinson's disease in her mother.  ROS:   Please see the history of present illness.     All other systems reviewed and are negative.  EKGs/Labs/Other Studies Reviewed:    The following studies were reviewed today:  Echo 09/14/2018  1. The left ventricle has normal systolic function with an ejection  fraction of 60-65%. The cavity size was normal. Left ventricular diastolic  Doppler  parameters are consistent with pseudonormalization.   2. The right ventricle has normal systolic function. The cavity was  normal. There is no increase in right ventricular wall thickness.   3. The mitral valve is grossly normal. Mild thickening of the mitral  valve leaflet. Mild calcification of the mitral valve leaflet.   4. The tricuspid valve is grossly normal.   5. The aortic valve is abnormal. Mild thickening of the aortic valve.  Mild calcification of the aortic valve.   6. The aorta is normal unless otherwise noted.   7. The aortic root and ascending aorta are normal in size and structure.   8. The atrial septum is grossly normal.   EKG:  EKG is ordered today.  The ekg ordered today demonstrates normal sinus rhythm, no significant ST-T wave changes, single PVC  Recent Labs: 02/16/2021: ALT 20; BUN 11; Creatinine, Ser 1.00; Hemoglobin 14.5; Platelets 258; Potassium 4.1; Sodium 138  Recent Lipid Panel    Component Value Date/Time   CHOL 191 01/07/2021 1118   TRIG 155 (H) 01/07/2021 1118   HDL 35 (L) 01/07/2021 1118   CHOLHDL 5.5 (H) 01/07/2021 1118   LDLCALC 128 (H) 01/07/2021 1118     Risk Assessment/Calculations:           Physical Exam:    VS:  BP 132/78    Pulse 67    Ht 5\' 6"  (1.676 m)    Wt 186 lb (84.4 kg)    SpO2 94%    BMI 30.02 kg/m     Wt Readings from Last 3 Encounters:  02/18/21 186 lb (84.4 kg)  02/16/21 186 lb (84.4 kg)  01/07/21 184 lb (83.5 kg)     GEN:  Well nourished, well developed in no acute distress HEENT: Normal NECK: No JVD; No carotid bruits LYMPHATICS: No lymphadenopathy CARDIAC: RRR, no murmurs, rubs, gallops RESPIRATORY:  Clear to auscultation without rales, wheezing or rhonchi  ABDOMEN: Soft, non-tender, non-distended MUSCULOSKELETAL:  No edema; No deformity  SKIN: Warm and dry NEUROLOGIC:  Alert and oriented x 3 PSYCHIATRIC:  Normal affect   ASSESSMENT:    1. Chest pain, unspecified type   2. Benign essential hypertension    3. Hyperlipidemia LDL goal <70   4. Controlled type 2 diabetes mellitus without complication, without long-term current use of insulin (HCC)    PLAN:    In order of problems listed above:  Chest discomfort: Her chest discomfort typically occurs at rest and never occur with physical activity.  Symptom has been going on for the past 2 weeks.  I recommended a coronary CT as initial evaluation.  Cardiac risk factors include hypertension, hyperlipidemia and diabetes.  Hypertension: Blood pressure stable.  Hyperlipidemia: On pravastatin  DM2: Managed by primary care  provider.  Last hemoglobin A1c was 6.9 on 12/24/2020.       Medication Adjustments/Labs and Tests Ordered: Current medicines are reviewed at length with the patient today.  Concerns regarding medicines are outlined above.  Orders Placed This Encounter  Procedures   CT CORONARY MORPH W/CTA COR W/SCORE W/CA W/CM &/OR WO/CM   Basic metabolic panel   EKG 09-NATF   Meds ordered this encounter  Medications   metoprolol tartrate (LOPRESSOR) 100 MG tablet    Sig: Take 1 tablet (100 mg total) by mouth 2 hours prior to procedure    Dispense:  1 tablet    Refill:  0    Patient Instructions  Medication Instructions:  Take Lopressor 100 mg 2 hours prior to test Hold Hydrochlorothiazide the morning of test  Your physician recommends that you continue on your current medications as directed. Please refer to the Current Medication list given to you today.   *If you need a refill on your cardiac medications before your next appointment, please call your pharmacy*   Lab Work: Your physician recommends that you return for lab work in 1 week prior to [place] If you have labs (blood work) drawn today and your tests are completely normal, you will receive your results only by: [place] (if you have MyChart) OR A paper copy in the mail If you have any lab test that is abnormal or we need to change your treatment, we will  call you to review the results.   Testing/Procedures: Your physician has requested that you have cardiac CT. Cardiac computed tomography (CT) is a painless test that uses an x-ray machine to take clear, detailed pictures of your heart. For further information please visit [email or web address]. Please follow instruction sheet as given.     Your cardiac CT will be scheduled at one of the below locations:   [facility] [address] [place], [place] [address] [phone]  [place] [address] [place], [place] [address] [phone]  If scheduled at [facility], please arrive at the [facility] main entrance (entrance A) of [facility] 30 minutes prior to test start time. You can use the FREE valet parking offered at the main entrance (encouraged to control the heart rate for the test) Proceed to the [facility] Radiology Department (first floor) to check-in and test prep.  If scheduled at [facility], please arrive 15 mins early for check-in and test prep.  Please follow these instructions carefully (unless otherwise directed):  On the Night Before the Test: Be sure to Drink plenty of water. Do not consume any caffeinated/decaffeinated beverages or chocolate 12 hours prior to your test. Do not take any antihistamines 12 hours prior to your test. If the patient has contrast allergy: Patient will need a prescription for Prednisone and very clear instructions (as follows): Prednisone 50 mg - take 13 hours prior to test Take another Prednisone 50 mg 7 hours prior to test Take another Prednisone 50 mg 1 hour prior to test Take Benadryl 50 mg 1 hour prior to test Patient must complete all four doses of above prophylactic medications. Patient will need a ride after test due to Benadryl.  On the Day of the Test: Drink plenty of water until 1 hour prior to the test. Do not  eat any food 4 hours prior to the test. You may take your regular medications prior to the test.  Take metoprolol (  Lopressor) two hours prior to test. HOLD Furosemide/Hydrochlorothiazide morning of the test. FEMALES- please wear underwire-free bra if available, avoid dresses & tight clothing   *For Clinical Staff only. Please instruct patient the following:* Heart Rate Medication Recommendations for Cardiac CT  Resting HR < 50 bpm  No medication  Resting HR 50-60 bpm and BP >110/50 mmHG   Consider Metoprolol tartrate 25 mg PO 90-120 min prior to scan  Resting HR 60-65 bpm and BP >110/50 mmHG  Metoprolol tartrate 50 mg PO 90-120 minutes prior to scan   Resting HR > 65 bpm and BP >110/50 mmHG  Metoprolol tartrate 100 mg PO 90-120 minutes prior to scan  Consider Ivabradine 10-15 mg PO or a calcium channel blocker for resting HR >60 bpm and contraindication to metoprolol tartrate  Consider Ivabradine 10-15 mg PO in combination with metoprolol tartrate for HR >80 bpm         After the Test: Drink plenty of water. After receiving IV contrast, you may experience a mild flushed feeling. This is normal. On occasion, you may experience a mild rash up to 24 hours after the test. This is not dangerous. If this occurs, you can take Benadryl 25 mg and increase your fluid intake. If you experience trouble breathing, this can be serious. If it is severe call 911 IMMEDIATELY. If it is mild, please call our office. If you take any of these medications: Glipizide/Metformin, Avandament, Glucavance, please do not take 48 hours after completing test unless otherwise instructed.  We will call to schedule your test 2-4 weeks out understanding that some insurance companies will need an authorization prior to the service being performed.   For non-scheduling related questions, please contact the cardiac imaging nurse navigator should you have any questions/concerns: Marchia Bond, Cardiac Imaging Nurse  Navigator Gordy Clement, Cardiac Imaging Nurse Navigator [facility] Direct Office Dial: [phone]   For scheduling needs, including cancellations and rescheduling, please call [place], [phone].    Follow-Up: At [facility], you and your health needs are our priority.  As part of our continuing mission to provide you with exceptional heart care, we have created designated Provider Care Teams.  These Care Teams include your primary Cardiologist (physician) and Advanced Practice Providers (APPs -  Physician Assistants and Nurse Practitioners) who all work together to provide you with the care you need, when you need it.  We recommend signing up for the patient portal called "MyChart".  Sign up information is provided on this After Visit Summary.  MyChart is used to connect with patients for Virtual Visits (Telemedicine).  Patients are able to view lab/test results, encounter notes, upcoming appointments, etc.  Non-urgent messages can be sent to your provider as well.   To learn more about what you can do with MyChart, go to [email or web address].    Your next appointment:   6 week(s)  The format for your next appointment:   In Person  Provider:   APP or Evalina Field, MD     Other Instructions IF CORONARY CT IS NORMAL, PT MAY CANCEL HER 6 WEEKS FOLLOW UP APPOINTMENT     Signed, Hannah Frazier, [place]  02/21/2021 12:01 AM    [place]

## 2021-02-20 ENCOUNTER — Encounter: Payer: Self-pay | Admitting: Physician Assistant

## 2021-02-21 ENCOUNTER — Other Ambulatory Visit (HOSPITAL_COMMUNITY): Payer: Self-pay

## 2021-02-23 ENCOUNTER — Other Ambulatory Visit (HOSPITAL_COMMUNITY): Payer: Self-pay

## 2021-02-24 DIAGNOSIS — R079 Chest pain, unspecified: Secondary | ICD-10-CM | POA: Diagnosis not present

## 2021-02-24 LAB — BASIC METABOLIC PANEL
BUN/Creatinine Ratio: 9 — ABNORMAL LOW (ref 12–28)
BUN: 9 mg/dL (ref 8–27)
CO2: 29 mmol/L (ref 20–29)
Calcium: 9.8 mg/dL (ref 8.7–10.3)
Chloride: 96 mmol/L (ref 96–106)
Creatinine, Ser: 0.99 mg/dL (ref 0.57–1.00)
Glucose: 119 mg/dL — ABNORMAL HIGH (ref 70–99)
Potassium: 4.3 mmol/L (ref 3.5–5.2)
Sodium: 136 mmol/L (ref 134–144)
eGFR: 62 mL/min/{1.73_m2} (ref >59)

## 2021-03-02 ENCOUNTER — Telehealth (HOSPITAL_COMMUNITY): Payer: Self-pay | Admitting: Emergency Medicine

## 2021-03-02 NOTE — Telephone Encounter (Signed)
Reaching out to patient to offer assistance regarding upcoming cardiac imaging study; pt verbalizes understanding of appt date/time, parking situation and where to check in, pre-test NPO status and medications ordered, and verified current allergies; name and call back number provided for further questions should they arise Marchia Bond RN Navigator Cardiac Imaging [facility] [phone] office [phone] cell  L arm better than R for IV Arrival 800 Prescribed 100mg  but taking 50mg  metoprolol tartrate

## 2021-03-03 ENCOUNTER — Other Ambulatory Visit: Payer: Self-pay

## 2021-03-03 ENCOUNTER — Encounter (HOSPITAL_COMMUNITY): Payer: Self-pay

## 2021-03-03 ENCOUNTER — Ambulatory Visit (HOSPITAL_COMMUNITY)
Admission: RE | Admit: 2021-03-03 | Discharge: 2021-03-03 | Disposition: A | Discharge status: Home / Self Care | Payer: PPO | Source: Ambulatory Visit | Attending: Physician Assistant | Admitting: Physician Assistant

## 2021-03-03 DIAGNOSIS — R079 Chest pain, unspecified: Secondary | ICD-10-CM | POA: Insufficient documentation

## 2021-03-03 DIAGNOSIS — I2510 Atherosclerotic heart disease of native coronary artery without angina pectoris: Secondary | ICD-10-CM | POA: Diagnosis not present

## 2021-03-03 MED ORDER — NITROGLYCERIN 0.4 MG SL SUBL
0.8000 mg | SUBLINGUAL_TABLET | Freq: Once | SUBLINGUAL | Status: AC
Start: 2021-03-03 — End: 2021-03-03
  Administered 2021-03-03: 0.8 mg via SUBLINGUAL

## 2021-03-03 MED ORDER — NITROGLYCERIN 0.4 MG SL SUBL
SUBLINGUAL_TABLET | SUBLINGUAL | Status: AC
  Filled 2021-03-03: qty 2

## 2021-03-03 MED ORDER — IOHEXOL 350 MG/ML SOLN
95.0000 mL | Freq: Once | INTRAVENOUS | Status: AC | PRN
  Administered 2021-03-03: 95 mL via INTRAVENOUS

## 2021-03-25 ENCOUNTER — Other Ambulatory Visit (HOSPITAL_COMMUNITY): Payer: Self-pay

## 2021-03-30 ENCOUNTER — Other Ambulatory Visit (HOSPITAL_COMMUNITY): Payer: Self-pay

## 2021-04-01 ENCOUNTER — Ambulatory Visit: Payer: PPO | Admitting: Physician Assistant

## 2021-04-03 ENCOUNTER — Other Ambulatory Visit: Payer: Self-pay | Admitting: *Deleted

## 2021-04-03 MED ORDER — PRAVASTATIN SODIUM 80 MG PO TABS: 80.0000 mg | ORAL_TABLET | Freq: Every day | ORAL | 0 refills | Status: DC

## 2021-04-17 ENCOUNTER — Other Ambulatory Visit (HOSPITAL_COMMUNITY): Payer: Self-pay

## 2021-04-24 ENCOUNTER — Encounter: Payer: PPO | Admitting: Adult Health

## 2021-04-24 ENCOUNTER — Other Ambulatory Visit: Payer: PPO

## 2021-05-22 ENCOUNTER — Other Ambulatory Visit (HOSPITAL_COMMUNITY): Payer: Self-pay

## 2021-05-22 ENCOUNTER — Other Ambulatory Visit: Payer: Self-pay | Admitting: Family Medicine

## 2021-05-22 NOTE — Telephone Encounter (Signed)
Requested medication (s) are due for refill today: Yes ? ?Requested medication (s) are on the active medication list: Yes ? ?Last refill:  02/13/21 ? ?Future visit scheduled: Yes ? ?Notes to clinic:  See request. ? ? ? ?Requested Prescriptions  ?Pending Prescriptions Disp Refills  ? zolpidem (AMBIEN) 5 MG tablet 30 tablet 0  ?  Sig: Take 1 tablet (5 mg total) by mouth at bedtime as needed for sleep.  ?  ? Not Delegated - Psychiatry:  Anxiolytics/Hypnotics Failed - 05/22/2021  5:21 AM  ?  ?  Failed - This refill cannot be delegated  ?  ?  Failed - Urine Drug Screen completed in last 360 days  ?  ?  Passed - Valid encounter within last 6 months  ?  Recent Outpatient Visits   ? ?      ? 3 months ago Insomnia, unspecified type  ? Primary Care at [facility], MD  ? 4 months ago Encounter for annual physical exam  ? Primary Care at [facility], MD  ? 4 months ago Diet-controlled diabetes mellitus [facility])  ? Primary Care at [facility], MD  ? ?  ?  ?Future Appointments   ? ?        ? In 1 month Dorna Mai, MD Primary Care at [facility]  ? ?  ? ? ?  ?  ?  ? ?

## 2021-05-25 ENCOUNTER — Other Ambulatory Visit (HOSPITAL_COMMUNITY): Payer: Self-pay

## 2021-05-25 MED ORDER — ZOLPIDEM TARTRATE 5 MG PO TABS
5.0000 mg | ORAL_TABLET | Freq: Every evening | ORAL | 0 refills | Status: DC | PRN
  Filled 2021-05-25: qty 30, 30d supply, fill #0

## 2021-06-29 ENCOUNTER — Ambulatory Visit: Payer: PPO | Admitting: Family Medicine

## 2021-06-29 ENCOUNTER — Other Ambulatory Visit (HOSPITAL_COMMUNITY): Payer: Self-pay

## 2021-07-03 ENCOUNTER — Other Ambulatory Visit (HOSPITAL_COMMUNITY): Payer: Self-pay

## 2021-07-03 DIAGNOSIS — Z85828 Personal history of other malignant neoplasm of skin: Secondary | ICD-10-CM | POA: Diagnosis not present

## 2021-07-03 DIAGNOSIS — L738 Other specified follicular disorders: Secondary | ICD-10-CM | POA: Diagnosis not present

## 2021-07-03 DIAGNOSIS — L570 Actinic keratosis: Secondary | ICD-10-CM | POA: Diagnosis not present

## 2021-07-03 DIAGNOSIS — D225 Melanocytic nevi of trunk: Secondary | ICD-10-CM | POA: Diagnosis not present

## 2021-07-03 DIAGNOSIS — L821 Other seborrheic keratosis: Secondary | ICD-10-CM | POA: Diagnosis not present

## 2021-07-03 DIAGNOSIS — D1801 Hemangioma of skin and subcutaneous tissue: Secondary | ICD-10-CM | POA: Diagnosis not present

## 2021-07-03 DIAGNOSIS — L718 Other rosacea: Secondary | ICD-10-CM | POA: Diagnosis not present

## 2021-07-03 DIAGNOSIS — D2371 Other benign neoplasm of skin of right lower limb, including hip: Secondary | ICD-10-CM | POA: Diagnosis not present

## 2021-07-03 DIAGNOSIS — D2239 Melanocytic nevi of other parts of face: Secondary | ICD-10-CM | POA: Diagnosis not present

## 2021-07-03 MED ORDER — METRONIDAZOLE 0.75 % EX CREA
TOPICAL_CREAM | CUTANEOUS | 3 refills | Status: DC
  Filled 2021-07-03: qty 45, 30d supply, fill #0
  Filled 2022-01-01: qty 45, 30d supply, fill #1

## 2021-07-23 ENCOUNTER — Other Ambulatory Visit (HOSPITAL_COMMUNITY): Payer: Self-pay

## 2021-07-23 ENCOUNTER — Ambulatory Visit (INDEPENDENT_AMBULATORY_CARE_PROVIDER_SITE_OTHER): Payer: PPO | Admitting: Family Medicine

## 2021-07-23 ENCOUNTER — Encounter: Payer: Self-pay | Admitting: Family Medicine

## 2021-07-23 VITALS — BP 106/73 | HR 85 | Temp 98.0°F | Resp 16 | Wt 181.0 lb

## 2021-07-23 DIAGNOSIS — R110 Nausea: Secondary | ICD-10-CM | POA: Diagnosis not present

## 2021-07-23 DIAGNOSIS — G4700 Insomnia, unspecified: Secondary | ICD-10-CM | POA: Diagnosis not present

## 2021-07-23 DIAGNOSIS — E7800 Pure hypercholesterolemia, unspecified: Secondary | ICD-10-CM | POA: Diagnosis not present

## 2021-07-23 DIAGNOSIS — I10 Essential (primary) hypertension: Secondary | ICD-10-CM

## 2021-07-23 DIAGNOSIS — E119 Type 2 diabetes mellitus without complications: Secondary | ICD-10-CM | POA: Diagnosis not present

## 2021-07-23 LAB — POCT GLYCOSYLATED HEMOGLOBIN (HGB A1C): Hemoglobin A1C: 6.4 % — AB (ref 4.0–5.6)

## 2021-07-23 MED ORDER — ZOLPIDEM TARTRATE 5 MG PO TABS
5.0000 mg | ORAL_TABLET | Freq: Every evening | ORAL | 0 refills | Status: DC | PRN
  Filled 2021-07-23: qty 45, 45d supply, fill #0

## 2021-07-23 MED ORDER — ONETOUCH ULTRA VI STRP
ORAL_STRIP | 2 refills | Status: DC
  Filled 2021-07-23: qty 100, 90d supply, fill #0
  Filled 2021-10-22: qty 100, 90d supply, fill #1
  Filled 2022-01-20: qty 50, 50d supply, fill #2
  Filled 2022-03-07: qty 50, 50d supply, fill #3

## 2021-07-23 MED ORDER — ONDANSETRON HCL 4 MG PO TABS
4.0000 mg | ORAL_TABLET | Freq: Three times a day (TID) | ORAL | 1 refills | Status: DC | PRN
  Filled 2021-07-23: qty 20, 7d supply, fill #0
  Filled 2021-10-22: qty 20, 7d supply, fill #1

## 2021-07-24 ENCOUNTER — Other Ambulatory Visit (HOSPITAL_COMMUNITY): Payer: Self-pay

## 2021-07-24 ENCOUNTER — Encounter: Payer: Self-pay | Admitting: Family Medicine

## 2021-07-24 LAB — LIPID PANEL
Chol/HDL Ratio: 5 ratio — ABNORMAL HIGH (ref 0.0–4.4)
Cholesterol, Total: 217 mg/dL — ABNORMAL HIGH (ref 100–199)
HDL: 43 mg/dL (ref >39)
LDL Chol Calc (NIH): 142 mg/dL — ABNORMAL HIGH (ref 0–99)
Triglycerides: 175 mg/dL — ABNORMAL HIGH (ref 0–149)
VLDL Cholesterol Cal: 32 mg/dL (ref 5–40)

## 2021-07-24 LAB — BASIC METABOLIC PANEL
BUN/Creatinine Ratio: 10 — ABNORMAL LOW (ref 12–28)
BUN: 9 mg/dL (ref 8–27)
CO2: 25 mmol/L (ref 20–29)
Calcium: 9.8 mg/dL (ref 8.7–10.3)
Chloride: 93 mmol/L — ABNORMAL LOW (ref 96–106)
Creatinine, Ser: 0.91 mg/dL (ref 0.57–1.00)
Glucose: 111 mg/dL — ABNORMAL HIGH (ref 70–99)
Potassium: 3.8 mmol/L (ref 3.5–5.2)
Sodium: 137 mmol/L (ref 134–144)
eGFR: 68 mL/min/{1.73_m2} (ref >59)

## 2021-07-24 LAB — AST: AST: 21 IU/L (ref 0–40)

## 2021-07-24 LAB — ALT: ALT: 18 IU/L (ref 0–32)

## 2021-07-24 LAB — SPECIMEN STATUS REPORT

## 2021-07-24 NOTE — Progress Notes (Signed)
Established Patient Office Visit  Subjective    Patient ID: Hannah Frazier, female    DOB: [birth year]  Age: 71 y.o. MRN: [id number]  CC: No chief complaint on file.   HPI Hannah Frazier presents    Outpatient Encounter Medications as of 07/23/2021  Medication Sig   hydrochlorothiazide (HYDRODIURIL) 25 MG tablet Take 1 tablet by mouth every morning.   LORazepam (ATIVAN) 1 MG tablet Take 0.5 mg by mouth at bedtime as needed (for sleep).    metroNIDAZOLE (METROCREAM) 0.75 % cream Apply dime-size amount to face twice daily and taper use to daily once improved.   ondansetron (ZOFRAN) 4 MG tablet Take 1 tablet (4 mg total) by mouth every 8 (eight) hours as needed for nausea or vomiting.   ONETOUCH ULTRA test strip USE TO TEST YOUR BLOOD SUGAR ONCE A DAY   pantoprazole (PROTONIX) 40 MG tablet TAKE 1 TABLET BY MOUTH EVERY DAY   pravastatin (PRAVACHOL) 80 MG tablet Take 1 tablet (80 mg total) by mouth daily.   [DISCONTINUED] zolpidem (AMBIEN) 5 MG tablet Take 1 tablet (5 mg total) by mouth at bedtime as needed for sleep.   zolpidem (AMBIEN) 5 MG tablet Take 1 tablet (5 mg total) by mouth at bedtime as needed for sleep.   [DISCONTINUED] metoprolol tartrate (LOPRESSOR) 100 MG tablet Take 1 tablet (100 mg total) by mouth 2 hours prior to procedure   [DISCONTINUED] potassium chloride SA (K-DUR,KLOR-CON) 20 MEQ tablet Take 1 tablet (20 mEq total) by mouth 2 (two) times daily.   No facility-administered encounter medications on file as of 07/23/2021.    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   chemo comp 08/2009   Vision abnormalities     Past Surgical History:  Procedure Laterality Date   ABDOMINAL HYSTERECTOMY     COLECTOMY      Family History  Problem Relation Age of  Onset   Cancer Mother        cervical ca   Parkinson's disease Mother    Heart disease Father     Social History   Socioeconomic History   Marital status: Divorced    Spouse name: Not on file   Number of children: Not on file   Years of education: Not on file   Highest education level: Not on file  Occupational History   Not on file  Tobacco Use   Smoking status: Never   Smokeless tobacco: Never  Substance and Sexual Activity   Alcohol use: No   Drug use: No   Sexual activity: Not on file  Other Topics Concern   Not on file  Social History Narrative   Not on file   Social Determinants of Health   Financial Resource Strain: Not on file  Food Insecurity: Not on file  Transportation Needs: Not on file  Physical Activity: Not on file  Stress: Not on file  Social Connections: Not on file  Intimate Partner Violence: Not on file    ROS      Objective    BP 106/73   Pulse 85   Temp 98 F (36.7 C) (Oral)   Resp 16   Wt 181 lb (82.1 kg)   SpO2 95%   BMI 29.21 kg/m  Physical Exam      Assessment & Plan:   1. Diet-controlled diabetes mellitus (HCC) A1c is improved and at goal. Continue and monitor - POCT glycosylated hemoglobin (Hb A1C)  2. Insomnia, unspecified type Ambien refilled  3. Nausea Zofran prescribed  4. Benign essential hypertension Appears stable. Continue present management and monitor - Lipid Panel - ALT - AST - Basic Metabolic Panel  5. Hypercholesterolemia Continue present management - Lipid Panel - ALT - AST - Basic Metabolic Panel  Return in about 6 months (around 01/22/2022) for follow up.   Becky Sax, MD

## 2021-09-03 ENCOUNTER — Other Ambulatory Visit: Payer: Self-pay | Admitting: Family Medicine

## 2021-09-03 ENCOUNTER — Other Ambulatory Visit (HOSPITAL_COMMUNITY): Payer: Self-pay

## 2021-09-03 MED ORDER — HYDROCHLOROTHIAZIDE 25 MG PO TABS
25.0000 mg | ORAL_TABLET | Freq: Every morning | ORAL | 1 refills | Status: DC
  Filled 2021-09-03: qty 90, 90d supply, fill #0
  Filled 2022-03-30: qty 90, 90d supply, fill #1

## 2021-09-28 ENCOUNTER — Other Ambulatory Visit: Payer: Self-pay | Admitting: Hematology and Oncology

## 2021-09-29 ENCOUNTER — Other Ambulatory Visit (HOSPITAL_COMMUNITY): Payer: Self-pay

## 2021-09-29 MED ORDER — PANTOPRAZOLE SODIUM 40 MG PO TBEC
40.0000 mg | DELAYED_RELEASE_TABLET | Freq: Every day | ORAL | 1 refills | Status: DC
  Filled 2021-09-29: qty 90, 90d supply, fill #0
  Filled 2021-12-23: qty 90, 90d supply, fill #1

## 2021-10-22 ENCOUNTER — Other Ambulatory Visit (HOSPITAL_COMMUNITY): Payer: Self-pay

## 2021-10-22 ENCOUNTER — Other Ambulatory Visit: Payer: Self-pay | Admitting: Family Medicine

## 2021-10-22 MED ORDER — ZOLPIDEM TARTRATE 5 MG PO TABS
5.0000 mg | ORAL_TABLET | Freq: Every evening | ORAL | 0 refills | Status: DC | PRN
  Filled 2021-10-22: qty 45, 45d supply, fill #0

## 2021-10-23 ENCOUNTER — Other Ambulatory Visit (HOSPITAL_COMMUNITY): Payer: Self-pay

## 2021-11-05 ENCOUNTER — Other Ambulatory Visit: Payer: Self-pay | Admitting: Family Medicine

## 2021-11-05 DIAGNOSIS — Z1231 Encounter for screening mammogram for malignant neoplasm of breast: Secondary | ICD-10-CM

## 2021-12-15 ENCOUNTER — Ambulatory Visit: Payer: PPO

## 2021-12-24 ENCOUNTER — Other Ambulatory Visit (HOSPITAL_COMMUNITY): Payer: Self-pay

## 2022-01-01 ENCOUNTER — Other Ambulatory Visit (HOSPITAL_COMMUNITY): Payer: Self-pay

## 2022-01-05 ENCOUNTER — Other Ambulatory Visit (HOSPITAL_COMMUNITY): Payer: Self-pay

## 2022-01-05 ENCOUNTER — Other Ambulatory Visit (HOSPITAL_BASED_OUTPATIENT_CLINIC_OR_DEPARTMENT_OTHER): Payer: Self-pay

## 2022-01-08 ENCOUNTER — Ambulatory Visit (INDEPENDENT_AMBULATORY_CARE_PROVIDER_SITE_OTHER): Payer: PPO

## 2022-01-08 VITALS — Ht 66.0 in | Wt 183.0 lb

## 2022-01-08 DIAGNOSIS — Z0000 Encounter for general adult medical examination without abnormal findings: Secondary | ICD-10-CM

## 2022-01-08 NOTE — Progress Notes (Signed)
Subjective:   Hannah Frazier is a 71 y.o. female who presents for Medicare Annual (Subsequent) preventive examination.  Review of Systems    Virtual Visit via Telephone Note  I connected with  Hannah Frazier on 01/08/22 at  2:30 PM EST by telephone and verified that I am speaking with the correct person using two identifiers.  Location: Patient: Home Provider: Office Persons participating in the virtual visit: patient/Nurse Health Advisor   I discussed the limitations, risks, security and privacy concerns of performing an evaluation and management service by telephone and the availability of in person appointments. The patient expressed understanding and agreed to proceed.  Interactive audio and video telecommunications were attempted between this nurse and patient, however failed, due to patient having technical difficulties OR patient did not have access to video capability.  We continued and completed visit with audio only.  Some vital signs may be absent or patient reported.   Criselda Peaches, LPN  Cardiac Risk Factors include: advanced age (>22mn, >>26women);diabetes mellitus;hypertension     Objective:    Today's Vitals   01/08/22 1436  Weight: 183 lb (83 kg)  Height: _0  (1.676 m)   Body mass index is 29.54 kg/m.     01/08/2022    2:45 PM 04/28/2015   11:55 AM 04/17/2014    9:20 AM 04/17/2014    9:00 AM  Advanced Directives  Does Patient Have a Medical Advance Directive? Yes Yes Yes No  Type of [place]of [place] will [place] will [place] will   Does patient want to make changes to medical advance directive?  No - Patient declined No - Patient declined   Copy of [place]? No - copy requested Yes Yes   Would patient like information on creating a medical advance directive?   No - patient declined information No - patient declined  information    Current Medications (verified) Outpatient Encounter Medications as of 01/08/2022  Medication Sig   hydrochlorothiazide (HYDRODIURIL) 25 MG tablet Take 1 tablet by mouth every morning.   LORazepam (ATIVAN) 1 MG tablet Take 0.5 mg by mouth at bedtime as needed (for sleep).    metroNIDAZOLE (METROCREAM) 0.75 % cream Apply dime-size amount to face twice daily and taper use to daily once improved.   ondansetron (ZOFRAN) 4 MG tablet Take 1 tablet (4 mg total) by mouth every 8 (eight) hours as needed for nausea or vomiting.   ONETOUCH ULTRA test strip USE TO TEST YOUR BLOOD SUGAR ONCE A DAY   pantoprazole (PROTONIX) 40 MG tablet TAKE 1 TABLET BY MOUTH EVERY DAY   pravastatin (PRAVACHOL) 80 MG tablet Take 1 tablet (80 mg total) by mouth daily.   zolpidem (AMBIEN) 5 MG tablet Take 1 tablet (5 mg total) by mouth at bedtime as needed for sleep.   [DISCONTINUED] potassium chloride SA (K-DUR,KLOR-CON) 20 MEQ tablet Take 1 tablet (20 mEq total) by mouth 2 (two) times daily.   No facility-administered encounter medications on file as of 01/08/2022.    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   chemo comp 08/2009   Vision abnormalities  Past Surgical History:  Procedure Laterality Date   ABDOMINAL HYSTERECTOMY     COLECTOMY     Family History  Problem Relation Age of Onset   Cancer Mother        cervical ca   Parkinson's disease Mother    Heart disease Father    Social History   Socioeconomic History   Marital status: Divorced    Spouse name: Not on file   Number of children: Not on file   Years of education: Not on file   Highest education level: Not on file  Occupational History   Not  on file  Tobacco Use   Smoking status: Never   Smokeless tobacco: Never  Substance and Sexual Activity   Alcohol use: No   Drug use: No   Sexual activity: Not on file  Other Topics Concern   Not on file  Social History Narrative   Not on file   Social Determinants of Health   Financial Resource Strain: Low Risk  (01/08/2022)   Overall Financial Resource Strain (CARDIA)    Difficulty of Paying Living Expenses: Not hard at all  Food Insecurity: No Food Insecurity (01/08/2022)   Hunger Vital Sign    Worried About Running Out of Food in the Last Year: Never true    [place] in the Last Year: Never true  Transportation Needs: No Transportation Needs (01/08/2022)   PRAPARE - Hydrologist (Medical): No    Lack of Transportation (Non-Medical): No  Physical Activity: Inactive (01/08/2022)   Exercise Vital Sign    Days of Exercise per Week: 0 days    Minutes of Exercise per Session: 0 min  Stress: No Stress Concern Present (01/08/2022)   [place]    Feeling of Stress : Not at all  Social Connections: Moderately Integrated (01/08/2022)   Social Connection and Isolation Panel [NHANES]    Frequency of Communication with Friends and Family: More than three times a week    Frequency of Social Gatherings with Friends and Family: More than three times a week    Attends Religious Services: More than 4 times per year    Active Member of [employer] or Organizations: Yes    Attends Music therapist: More than 4 times per year    Marital Status: Divorced    Tobacco Counseling Counseling given: Not Answered   Clinical Intake:  Pre-visit preparation completed: No  Pain : No/denies pain    Nutrition Risk Assessment:  Has the patient had any N/V/D within the last 2 months?  No  Does the patient have any non-healing wounds?  No  Has the patient had any unintentional weight  loss or weight gain?  No   Diabetes:  Is the patient diabetic?  Yes  If diabetic, was a CBG obtained today?  No  Did the patient bring in their glucometer from home?  No  How often do you monitor your CBG's? PRN.   Financial Strains and Diabetes Management:  Are you having any financial strains with the device, your supplies or your medication? No .  Does the patient want to be seen by Chronic Care Management for management of their diabetes?  No  Would the patient like to be referred to a Nutritionist or for Diabetic Management?  No   Diabetic Exams:  Diabetic Eye Exam: Completed No. Overdue for diabetic eye exam. Pt has been advised about the importance  in completing this exam. A referral has been placed today. Message sent to referral coordinator for scheduling purposes. Advised pt to expect a call from office referred to regarding appt.  Diabetic Foot Exam: Completed No. Pt has been advised about the importance in completing this exam. Pt is scheduled for diabetic foot exam on Followed by PCP.   BMI - recorded: 29.54 Nutritional Status: BMI 25 -29 Overweight Nutritional Risks: None Diabetes: Yes CBG done?: No Did pt. bring in CBG monitor from home?: No  How often do you need to have someone help you when you read instructions, pamphlets, or other written materials from your doctor or pharmacy?: 1 - Never  Diabetic?    Interpreter Needed?: No  Information entered by :: Rolene Arbour LPN   Activities of Daily Living    01/08/2022    2:42 PM  In your present state of health, do you have any difficulty performing the following activities:  Hearing? 0  Vision? 0  Difficulty concentrating or making decisions? 0  Walking or climbing stairs? 0  Dressing or bathing? 0  Doing errands, shopping? 0  Preparing Food and eating ? N  Using the Toilet? N  In the past six months, have you accidently leaked urine? N  Do you have problems with loss of bowel control? N  Managing  your Medications? N  Managing your Finances? N  Housekeeping or managing your Housekeeping? N    Patient Care Team: Dorna Mai, MD as PCP - General (Family Medicine) O'Neal, Cassie Freer, MD as PCP - Cardiology (Cardiology) Arta Silence, MD as Consulting Physician (Gastroenterology)  Indicate any recent Medical Services you may have received from other than [facility] providers in the past year (date may be approximate).     Assessment:   This is a routine wellness examination for Hannah Frazier.  Hearing/Vision screen Hearing Screening - Comments:: Denies hearing difficulties   Vision Screening - Comments:: Wears rx glasses - up to date with routine eye exams with  [facility]  Dietary issues and exercise activities discussed: Exercise limited by: None identified   Goals Addressed               This Visit's Progress     patient stated (pt-stated)        I would like to complete projects. And lose weight       Depression Screen    01/08/2022    2:40 PM 07/23/2021    2:58 PM 01/07/2021    8:18 AM  PHQ 2/9 Scores  PHQ - 2 Score 0 0 0  PHQ- 9 Score  2 2    Fall Risk    01/08/2022    2:43 PM 01/03/2021   10:39 PM 10/15/2013   12:38 PM  Spring in the past year? 0 0 No  Number falls in past yr: 0    Injury with Fall? 0    Risk for fall due to : No Fall Risks    Follow up Falls prevention discussed      Carrier:  Any stairs in or around the home? Yes  If so, are there any without handrails? No  Home free of loose throw rugs in walkways, pet beds, electrical cords, etc? Yes  Adequate lighting in your home to reduce risk of falls? Yes   ASSISTIVE DEVICES UTILIZED TO PREVENT FALLS:  Life alert? No  Use of a cane, walker or w/c? No  Grab bars  in the bathroom? No  Shower chair or bench in shower? No  Elevated toilet seat or a handicapped toilet? No   TIMED UP AND GO:  Was the test performed? No . Audio  Visit   Cognitive Function:    01/07/2021    8:28 AM  MMSE - Mini Mental State Exam  Orientation to time 5  Orientation to Place 5  Registration 3  Attention/ Calculation 5  Recall 3  Language- name 2 objects 2  Language- repeat 1  Language- follow 3 step command 3  Language- read & follow direction 1  Write a sentence 1  Copy design 1  Total score 30        01/08/2022    2:45 PM  6CIT Screen  What Year? 0 points  What month? 0 points  What time? 0 points  Count back from 20 0 points  Months in reverse 0 points  Repeat phrase 0 points  Total Score 0 points    Immunizations Immunization History  Administered Date(s) Administered   Influenza Split 10/16/2009, 11/24/2011, 11/22/2015, 11/02/2018, 10/29/2019, 11/10/2020   Influenza, High Dose Seasonal PF 11/27/2016, 11/08/2017   PFIZER(Purple Top)SARS-COV-2 Vaccination 03/06/2019, 03/26/2019, 11/26/2019   Pneumococcal Conjugate-13 08/22/2013   Pneumococcal Polysaccharide-23 09/14/2011, 11/17/2018   Tdap 08/28/2010    TDAP status: Due, Education has been provided regarding the importance of this vaccine. Advised may receive this vaccine at local pharmacy or Health Dept. Aware to provide a copy of the vaccination record if obtained from local pharmacy or Health Dept. Verbalized acceptance and understanding.  Flu Vaccine status: Up to date  Pneumococcal vaccine status: Up to date  Covid-19 vaccine status: Completed vaccines  Qualifies for Shingles Vaccine? Yes   Zostavax completed No   Shingrix Completed?: No.    Education has been provided regarding the importance of this vaccine. Patient has been advised to call insurance company to determine out of pocket expense if they have not yet received this vaccine. Advised may also receive vaccine at local pharmacy or Health Dept. Verbalized acceptance and understanding.  Screening Tests Health Maintenance  Topic Date Due   FOOT EXAM  Never done   DTaP/Tdap/Td (2 - Td  or Tdap) 08/27/2020   OPHTHALMOLOGY EXAM  11/28/2021   Diabetic kidney evaluation - Urine ACR  01/09/2022 (Originally 01/07/2022)   COVID-19 Vaccine (4 - 2023-24 season) 01/24/2022 (Originally 09/25/2021)   Zoster Vaccines- Shingrix (1 of 2) 04/09/2022 (Originally 08/31/2001)   INFLUENZA VACCINE  04/25/2022 (Originally 08/25/2021)   DEXA SCAN  01/09/2023 (Originally 08/31/2016)   HEMOGLOBIN A1C  01/22/2022   Diabetic kidney evaluation - eGFR measurement  07/24/2022   MAMMOGRAM  12/10/2022   Medicare Annual Wellness (AWV)  01/09/2023   COLONOSCOPY (Pts 45-61yr Insurance coverage will need to be confirmed)  07/25/2023   Pneumonia Vaccine 70 Years old  Completed   Hepatitis C Screening  Completed   HPV VACCINES  Aged Out    Health Maintenance  Health Maintenance Due  Topic Date Due   FOOT EXAM  Never done   DTaP/Tdap/Td (2 - Td or Tdap) 08/27/2020   OPHTHALMOLOGY EXAM  11/28/2021    Colorectal cancer screening: Type of screening: Colonoscopy. Completed 07/24/13. Repeat every 10 years  Mammogram status: Ordered 11/05/21. Pt provided with contact info and advised to call to schedule appt.   Bone Density status: Ordered Patient declined. Pt provided with contact info and advised to call to schedule appt.  Lung Cancer Screening: (Low Dose CT Chest recommended if  Age 25-80 years, 30 pack-year currently smoking OR have quit w/in 15years.) does not qualify.     Additional Screening:  Hepatitis C Screening: does qualify; Completed 05/07/09  Vision Screening: Recommended annual ophthalmology exams for early detection of glaucoma and other disorders of the eye. Is the patient up to date with their annual eye exam?  Yes  Who is the provider or what is the name of the office in which the patient attends annual eye exams? [facility] If pt is not established with a provider, would they like to be referred to a provider to establish care? No .   Dental Screening: Recommended annual dental exams  for proper oral hygiene  Community Resource Referral / Chronic Care Management:  CRR required this visit?  No   CCM required this visit?  No      Plan:     I have personally reviewed and noted the following in the patient's chart:   Medical and social history Use of alcohol, tobacco or illicit drugs  Current medications and supplements including opioid prescriptions. Patient is not currently taking opioid prescriptions. Functional ability and status Nutritional status Physical activity Advanced directives List of other physicians Hospitalizations, surgeries, and ER visits in previous 12 months Vitals Screenings to include cognitive, depression, and falls Referrals and appointments  In addition, I have reviewed and discussed with patient certain preventive protocols, quality metrics, and best practice recommendations. A written personalized care plan for preventive services as well as general preventive health recommendations were provided to patient.     Criselda Peaches, LPN   [id number]   Nurse Notes: Patient due Diabetic kidney evaluation-Urine ACR

## 2022-01-08 NOTE — Patient Instructions (Addendum)
Ms. Hannah Frazier , Thank you for taking time to come for your Medicare Wellness Visit. I appreciate your ongoing commitment to your health goals. Please review the following plan we discussed and let me know if I can assist you in the future.   These are the goals we discussed:  Goals       patient stated (pt-stated)      I would like to complete projects. And lose weight        This is a list of the screening recommended for you and due dates:  Health Maintenance  Topic Date Due   Complete foot exam   Never done   DTaP/Tdap/Td vaccine (2 - Td or Tdap) 08/27/2020   Eye exam for diabetics  11/28/2021   Yearly kidney health urinalysis for diabetes  01/09/2022*   COVID-19 Vaccine (4 - 2023-24 season) 01/24/2022*   Zoster (Shingles) Vaccine (1 of 2) 04/09/2022*   Flu Shot  04/25/2022*   DEXA scan (bone density measurement)  01/09/2023*   Hemoglobin A1C  01/22/2022   Yearly kidney function blood test for diabetes  07/24/2022   Mammogram  12/10/2022   Medicare Annual Wellness Visit  01/09/2023   Colon Cancer Screening  07/25/2023   Pneumonia Vaccine  Completed   Hepatitis C Screening: USPSTF Recommendation to screen - Ages 18-79 yo.  Completed   HPV Vaccine  Aged Out  *Topic was postponed. The date shown is not the original due date.    Advanced directives: Please bring a copy of your health care power of attorney and living will to the office to be added to your chart at your convenience.   Conditions/risks identified: None  Next appointment: Follow up in one year for your annual wellness visit     Preventive Care 65 Years and Older, Female Preventive care refers to lifestyle choices and visits with your health care provider that can promote health and wellness. What does preventive care include? A yearly physical exam. This is also called an annual well check. Dental exams once or twice a year. Routine eye exams. Ask your health care provider how often you should have your eyes  checked. Personal lifestyle choices, including: Daily care of your teeth and gums. Regular physical activity. Eating a healthy diet. Avoiding tobacco and drug use. Limiting alcohol use. Practicing safe sex. Taking low-dose aspirin every day. Taking vitamin and mineral supplements as recommended by your health care provider. What happens during an annual well check? The services and screenings done by your health care provider during your annual well check will depend on your age, overall health, lifestyle risk factors, and family history of disease. Counseling  Your health care provider may ask you questions about your: Alcohol use. Tobacco use. Drug use. Emotional well-being. Home and relationship well-being. Sexual activity. Eating habits. History of falls. Memory and ability to understand (cognition). Work and work Statistician. Reproductive health. Screening  You may have the following tests or measurements: Height, weight, and BMI. Blood pressure. Lipid and cholesterol levels. These may be checked every 5 years, or more frequently if you are over 71 years old. Skin check. Lung cancer screening. You may have this screening every year starting at age 44 if you have a 30-pack-year history of smoking and currently smoke or have quit within the past 15 years. Fecal occult blood test (FOBT) of the stool. You may have this test every year starting at age 77. Flexible sigmoidoscopy or colonoscopy. You may have a sigmoidoscopy every 5 years  or a colonoscopy every 10 years starting at age 51. Hepatitis C blood test. Hepatitis B blood test. Sexually transmitted disease (STD) testing. Diabetes screening. This is done by checking your blood sugar (glucose) after you have not eaten for a while (fasting). You may have this done every 1-3 years. Bone density scan. This is done to screen for osteoporosis. You may have this done starting at age 93. Mammogram. This may be done every 1-2  years. Talk to your health care provider about how often you should have regular mammograms. Talk with your health care provider about your test results, treatment options, and if necessary, the need for more tests. Vaccines  Your health care provider may recommend certain vaccines, such as: Influenza vaccine. This is recommended every year. Tetanus, diphtheria, and acellular pertussis (Tdap, Td) vaccine. You may need a Td booster every 10 years. Zoster vaccine. You may need this after age 62. Pneumococcal 13-valent conjugate (PCV13) vaccine. One dose is recommended after age 54. Pneumococcal polysaccharide (PPSV23) vaccine. One dose is recommended after age 7. Talk to your health care provider about which screenings and vaccines you need and how often you need them. This information is not intended to replace advice given to you by your health care provider. Make sure you discuss any questions you have with your health care provider. Document Released: 02/07/2015 Document Revised: 10/01/2015 Document Reviewed: 11/12/2014 Elsevier Interactive Patient Education  2017 [place] Prevention in the Home Falls can cause injuries. They can happen to people of all ages. There are many things you can do to make your home safe and to help prevent falls. What can I do on the outside of my home? Regularly fix the edges of walkways and driveways and fix any cracks. Remove anything that might make you trip as you walk through a door, such as a raised step or threshold. Trim any bushes or trees on the path to your home. Use bright outdoor lighting. Clear any walking paths of anything that might make someone trip, such as rocks or tools. Regularly check to see if handrails are loose or broken. Make sure that both sides of any steps have handrails. Any raised decks and porches should have guardrails on the edges. Have any leaves, snow, or ice cleared regularly. Use sand or salt on walking paths  during winter. Clean up any spills in your garage right away. This includes oil or grease spills. What can I do in the bathroom? Use night lights. Install grab bars by the toilet and in the tub and shower. Do not use towel bars as grab bars. Use non-skid mats or decals in the tub or shower. If you need to sit down in the shower, use a plastic, non-slip stool. Keep the floor dry. Clean up any water that spills on the floor as soon as it happens. Remove soap buildup in the tub or shower regularly. Attach bath mats securely with double-sided non-slip rug tape. Do not have throw rugs and other things on the floor that can make you trip. What can I do in the bedroom? Use night lights. Make sure that you have a light by your bed that is easy to reach. Do not use any sheets or blankets that are too big for your bed. They should not hang down onto the floor. Have a firm chair that has side arms. You can use this for support while you get dressed. Do not have throw rugs and other things on the floor  that can make you trip. What can I do in the kitchen? Clean up any spills right away. Avoid walking on wet floors. Keep items that you use a lot in easy-to-reach places. If you need to reach something above you, use a strong step stool that has a grab bar. Keep electrical cords out of the way. Do not use floor polish or wax that makes floors slippery. If you must use wax, use non-skid floor wax. Do not have throw rugs and other things on the floor that can make you trip. What can I do with my stairs? Do not leave any items on the stairs. Make sure that there are handrails on both sides of the stairs and use them. Fix handrails that are broken or loose. Make sure that handrails are as long as the stairways. Check any carpeting to make sure that it is firmly attached to the stairs. Fix any carpet that is loose or worn. Avoid having throw rugs at the top or bottom of the stairs. If you do have throw  rugs, attach them to the floor with carpet tape. Make sure that you have a light switch at the top of the stairs and the bottom of the stairs. If you do not have them, ask someone to add them for you. What else can I do to help prevent falls? Wear shoes that: Do not have high heels. Have rubber bottoms. Are comfortable and fit you well. Are closed at the toe. Do not wear sandals. If you use a stepladder: Make sure that it is fully opened. Do not climb a closed stepladder. Make sure that both sides of the stepladder are locked into place. Ask someone to hold it for you, if possible. Clearly mark and make sure that you can see: Any grab bars or handrails. First and last steps. Where the edge of each step is. Use tools that help you move around (mobility aids) if they are needed. These include: Canes. Walkers. Scooters. Crutches. Turn on the lights when you go into a dark area. Replace any light bulbs as soon as they burn out. Set up your furniture so you have a clear path. Avoid moving your furniture around. If any of your floors are uneven, fix them. If there are any pets around you, be aware of where they are. Review your medicines with your doctor. Some medicines can make you feel dizzy. This can increase your chance of falling. Ask your doctor what other things that you can do to help prevent falls. This information is not intended to replace advice given to you by your health care provider. Make sure you discuss any questions you have with your health care provider. Document Released: 11/07/2008 Document Revised: 06/19/2015 Document Reviewed: 02/15/2014 Elsevier Interactive Patient Education  2017 [employer].

## 2022-01-20 ENCOUNTER — Other Ambulatory Visit: Payer: Self-pay

## 2022-01-20 ENCOUNTER — Other Ambulatory Visit: Payer: Self-pay | Admitting: Family Medicine

## 2022-01-20 DIAGNOSIS — G4700 Insomnia, unspecified: Secondary | ICD-10-CM

## 2022-01-21 ENCOUNTER — Other Ambulatory Visit: Payer: Self-pay

## 2022-01-21 ENCOUNTER — Other Ambulatory Visit (HOSPITAL_COMMUNITY): Payer: Self-pay

## 2022-01-21 MED ORDER — ZOLPIDEM TARTRATE 5 MG PO TABS
5.0000 mg | ORAL_TABLET | Freq: Every evening | ORAL | 0 refills | Status: DC | PRN
  Filled 2022-01-21: qty 45, 45d supply, fill #0

## 2022-01-21 NOTE — Telephone Encounter (Signed)
Order complete. 

## 2022-02-04 ENCOUNTER — Ambulatory Visit: Payer: PPO | Admitting: Family Medicine

## 2022-02-04 ENCOUNTER — Encounter: Payer: Self-pay | Admitting: Family Medicine

## 2022-02-04 ENCOUNTER — Ambulatory Visit (INDEPENDENT_AMBULATORY_CARE_PROVIDER_SITE_OTHER): Payer: PPO | Admitting: Family Medicine

## 2022-02-04 VITALS — BP 144/84 | HR 76 | Temp 98.1°F | Resp 16 | Wt 185.0 lb

## 2022-02-04 DIAGNOSIS — I10 Essential (primary) hypertension: Secondary | ICD-10-CM | POA: Diagnosis not present

## 2022-02-04 DIAGNOSIS — E119 Type 2 diabetes mellitus without complications: Secondary | ICD-10-CM

## 2022-02-04 DIAGNOSIS — E7800 Pure hypercholesterolemia, unspecified: Secondary | ICD-10-CM | POA: Diagnosis not present

## 2022-02-04 DIAGNOSIS — K2990 Gastroduodenitis, unspecified, without bleeding: Secondary | ICD-10-CM

## 2022-02-04 LAB — POCT GLYCOSYLATED HEMOGLOBIN (HGB A1C): Hemoglobin A1C: 6.2 % — AB (ref 4.0–5.6)

## 2022-02-04 NOTE — Progress Notes (Signed)
Established Patient Office Visit  Subjective    Patient ID: Hannah Frazier, female    DOB: Feb 03, 1950  Age: 72 y.o. MRN: [id number]  CC:  Chief Complaint  Patient presents with   Follow-up    HPI Hannah Frazier presents for routine follow up for chronic med issues. Patient does reports that she had leg pains but that she stopped her statin and the pain resolved. She had a similar complaint with previous statin use.    Outpatient Encounter Medications as of 02/04/2022  Medication Sig   hydrochlorothiazide (HYDRODIURIL) 25 MG tablet Take 1 tablet by mouth every morning.   LORazepam (ATIVAN) 1 MG tablet Take 0.5 mg by mouth at bedtime as needed (for sleep).    metroNIDAZOLE (METROCREAM) 0.75 % cream Apply dime-size amount to face twice daily and taper use to daily once improved.   ondansetron (ZOFRAN) 4 MG tablet Take 1 tablet (4 mg total) by mouth every 8 (eight) hours as needed for nausea or vomiting.   ONETOUCH ULTRA test strip USE TO TEST YOUR BLOOD SUGAR ONCE A DAY   pantoprazole (PROTONIX) 40 MG tablet TAKE 1 TABLET BY MOUTH EVERY DAY   pravastatin (PRAVACHOL) 80 MG tablet Take 1 tablet (80 mg total) by mouth daily.   zolpidem (AMBIEN) 5 MG tablet Take 1 tablet (5 mg total) by mouth at bedtime as needed for sleep.   [DISCONTINUED] potassium chloride SA (K-DUR,KLOR-CON) 20 MEQ tablet Take 1 tablet (20 mEq total) by mouth 2 (two) times daily.   No facility-administered encounter medications on file as of 02/04/2022.    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   chemo comp 08/2009   Vision abnormalities     Past Surgical History:  Procedure Laterality Date   ABDOMINAL HYSTERECTOMY     COLECTOMY      Family History  Problem Relation Age of Onset   Cancer  Mother        cervical ca   Parkinson's disease Mother    Heart disease Father     Social History   Socioeconomic History   Marital status: Divorced    Spouse name: Not on file   Number of children: Not on file   Years of education: Not on file   Highest education level: Not on file  Occupational History   Not on file  Tobacco Use   Smoking status: Never   Smokeless tobacco: Never  Substance and Sexual Activity   Alcohol use: No   Drug use: No   Sexual activity: Not on file  Other Topics Concern   Not on file  Social History Narrative   Not on file   Social Determinants of Health   Financial Resource Strain: Low Risk  (01/08/2022)   Overall Financial Resource Strain (CARDIA)    Difficulty of Paying Living Expenses: Not hard at all  Food Insecurity: No Food Insecurity (01/08/2022)   Hunger Vital Sign    Worried About Running Out of Food in the Last Year: Never true    [place] in the Last Year: Never true  Transportation Needs: No Transportation Needs (01/08/2022)   PRAPARE - Hydrologist (Medical): No    Lack of Transportation (Non-Medical): No  Physical Activity: Inactive (01/08/2022)   Exercise Vital Sign    Days of Exercise per Week: 0 days    Minutes of Exercise per Session: 0 min  Stress: No Stress Concern Present (01/08/2022)   [place]    Feeling of Stress : Not at all  Social Connections: Moderately Integrated (01/08/2022)   Social Connection and Isolation Panel [NHANES]    Frequency of Communication with Friends and Family: More than three times a week    Frequency of Social Gatherings with Friends and Family: More than three times a week    Attends Religious Services: More than 4 times per year    Active Member of [employer] or Organizations: Yes    Attends Archivist Meetings: More than 4 times per year    Marital Status: Divorced  Intimate  Partner Violence: Not At Risk (01/08/2022)   Humiliation, Afraid, Rape, and Kick questionnaire    Fear of Current or Ex-Partner: No    Emotionally Abused: No    Physically Abused: No    Sexually Abused: No    Review of Systems  All other systems reviewed and are negative.       Objective    BP (!) 144/84   Pulse 76   Temp 98.1 F (36.7 C) (Oral)   Resp 16   Wt 185 lb (83.9 kg)   SpO2 95%   BMI 29.86 kg/m   Physical Exam Vitals and nursing note reviewed.  Constitutional:      General: She is not in acute distress. Cardiovascular:     Rate and Rhythm: Normal rate and regular rhythm.  Pulmonary:     Effort: Pulmonary effort is normal.     Breath sounds: Normal breath sounds.  Abdominal:     Palpations: Abdomen is soft.     Tenderness: There is no abdominal tenderness.  Neurological:     General: No focal deficit present.     Mental Status: She is alert and oriented to person, place, and time.         Assessment & Plan:   1. Diet-controlled diabetes mellitus (HCC) Improved A1c and at goal. Will d/c metformin and monitoring - POCT glycosylated hemoglobin (Hb A1C)  2. Benign essential hypertension Slightly elevated readings. continue - Basic Metabolic Panel  3. Hypercholesterolemia Will d/c statin and monitor - Lipid Panel  4. Gastritis and duodenitis Continue     Return in about 6 months (around 08/05/2022) for follow up.   Becky Sax, MD

## 2022-02-04 NOTE — Progress Notes (Signed)
Patient is here for their 6 month follow-up Patient has no concerns today Care gaps have been discussed with patient  

## 2022-02-05 ENCOUNTER — Telehealth: Payer: Self-pay

## 2022-02-05 LAB — BASIC METABOLIC PANEL
BUN/Creatinine Ratio: 13 (ref 12–28)
BUN: 13 mg/dL (ref 8–27)
CO2: 23 mmol/L (ref 20–29)
Calcium: 9.7 mg/dL (ref 8.7–10.3)
Chloride: 101 mmol/L (ref 96–106)
Creatinine, Ser: 1.02 mg/dL — ABNORMAL HIGH (ref 0.57–1.00)
Glucose: 134 mg/dL — ABNORMAL HIGH (ref 70–99)
Potassium: 4.3 mmol/L (ref 3.5–5.2)
Sodium: 140 mmol/L (ref 134–144)
eGFR: 59 mL/min/{1.73_m2} — ABNORMAL LOW (ref >59)

## 2022-02-05 LAB — LIPID PANEL
Chol/HDL Ratio: 7.2 ratio — ABNORMAL HIGH (ref 0.0–4.4)
Cholesterol, Total: 258 mg/dL — ABNORMAL HIGH (ref 100–199)
HDL: 36 mg/dL — ABNORMAL LOW (ref >39)
LDL Chol Calc (NIH): 176 mg/dL — ABNORMAL HIGH (ref 0–99)
Triglycerides: 243 mg/dL — ABNORMAL HIGH (ref 0–149)
VLDL Cholesterol Cal: 46 mg/dL — ABNORMAL HIGH (ref 5–40)

## 2022-02-05 NOTE — Telephone Encounter (Signed)
Hannah Frazier with [facility] calling to report that they received Lavendar tube with other samples that was not needed for labs ordered for pt. Verified labs were BMP and Lipid. No further assistance needed.

## 2022-02-08 ENCOUNTER — Encounter: Payer: Self-pay | Admitting: Family Medicine

## 2022-02-08 ENCOUNTER — Other Ambulatory Visit: Payer: Self-pay | Admitting: Family Medicine

## 2022-02-08 MED ORDER — FENOFIBRATE 48 MG PO TABS: 48.0000 mg | ORAL_TABLET | Freq: Every day | ORAL | 1 refills | Status: DC

## 2022-02-08 NOTE — Progress Notes (Signed)
en

## 2022-02-16 ENCOUNTER — Ambulatory Visit: Payer: PPO | Admitting: Hematology and Oncology

## 2022-02-16 ENCOUNTER — Other Ambulatory Visit: Payer: PPO

## 2022-03-05 ENCOUNTER — Ambulatory Visit: Payer: PPO

## 2022-03-07 ENCOUNTER — Other Ambulatory Visit: Payer: Self-pay

## 2022-03-08 ENCOUNTER — Other Ambulatory Visit (HOSPITAL_COMMUNITY): Payer: Self-pay

## 2022-03-30 ENCOUNTER — Other Ambulatory Visit: Payer: Self-pay

## 2022-03-30 ENCOUNTER — Inpatient Hospital Stay (HOSPITAL_BASED_OUTPATIENT_CLINIC_OR_DEPARTMENT_OTHER): Payer: PPO | Admitting: Hematology and Oncology

## 2022-03-30 ENCOUNTER — Other Ambulatory Visit: Payer: Self-pay | Admitting: Hematology and Oncology

## 2022-03-30 ENCOUNTER — Inpatient Hospital Stay: Payer: PPO | Attending: Hematology and Oncology

## 2022-03-30 ENCOUNTER — Encounter: Payer: Self-pay | Admitting: Hematology and Oncology

## 2022-03-30 VITALS — BP 135/92 | HR 70 | Temp 97.5°F | Resp 18 | Ht 66.0 in | Wt 180.1 lb

## 2022-03-30 DIAGNOSIS — Z8572 Personal history of non-Hodgkin lymphomas: Secondary | ICD-10-CM | POA: Diagnosis not present

## 2022-03-30 DIAGNOSIS — Z79899 Other long term (current) drug therapy: Secondary | ICD-10-CM | POA: Insufficient documentation

## 2022-03-30 DIAGNOSIS — Z6825 Body mass index (BMI) 25.0-25.9, adult: Secondary | ICD-10-CM | POA: Diagnosis not present

## 2022-03-30 DIAGNOSIS — Z888 Allergy status to other drugs, medicaments and biological substances status: Secondary | ICD-10-CM | POA: Diagnosis not present

## 2022-03-30 DIAGNOSIS — E663 Overweight: Secondary | ICD-10-CM | POA: Diagnosis not present

## 2022-03-30 DIAGNOSIS — I700 Atherosclerosis of aorta: Secondary | ICD-10-CM | POA: Insufficient documentation

## 2022-03-30 LAB — COMPREHENSIVE METABOLIC PANEL
ALT: 17 U/L (ref 0–44)
AST: 19 U/L (ref 15–41)
Albumin: 4.3 g/dL (ref 3.5–5.0)
Alkaline Phosphatase: 68 U/L (ref 38–126)
Anion gap: 6 (ref 5–15)
BUN: 10 mg/dL (ref 8–23)
CO2: 32 mmol/L (ref 22–32)
Calcium: 9.6 mg/dL (ref 8.9–10.3)
Chloride: 102 mmol/L (ref 98–111)
Creatinine, Ser: 0.95 mg/dL (ref 0.44–1.00)
GFR, Estimated: >60 mL/min (ref >60)
Glucose, Bld: 135 mg/dL — ABNORMAL HIGH (ref 70–99)
Potassium: 3.7 mmol/L (ref 3.5–5.1)
Sodium: 140 mmol/L (ref 135–145)
Total Bilirubin: 0.7 mg/dL (ref 0.3–1.2)
Total Protein: 7.9 g/dL (ref 6.5–8.1)

## 2022-03-30 LAB — CBC WITH DIFFERENTIAL/PLATELET
Abs Immature Granulocytes: 0.01 10*3/uL (ref 0.00–0.07)
Basophils Absolute: 0.1 10*3/uL (ref 0.0–0.1)
Basophils Relative: 1 %
Eosinophils Absolute: 0.3 10*3/uL (ref 0.0–0.5)
Eosinophils Relative: 4 %
HCT: 44.3 % (ref 36.0–46.0)
Hemoglobin: 15.1 g/dL — ABNORMAL HIGH (ref 12.0–15.0)
Immature Granulocytes: 0 %
Lymphocytes Relative: 47 %
Lymphs Abs: 3.1 10*3/uL (ref 0.7–4.0)
MCH: 29.5 pg (ref 26.0–34.0)
MCHC: 34.1 g/dL (ref 30.0–36.0)
MCV: 86.5 fL (ref 80.0–100.0)
Monocytes Absolute: 0.5 10*3/uL (ref 0.1–1.0)
Monocytes Relative: 7 %
Neutro Abs: 2.7 10*3/uL (ref 1.7–7.7)
Neutrophils Relative %: 41 %
Platelets: 287 10*3/uL (ref 150–400)
RBC: 5.12 MIL/uL — ABNORMAL HIGH (ref 3.87–5.11)
RDW: 12.7 % (ref 11.5–15.5)
WBC: 6.6 10*3/uL (ref 4.0–10.5)
nRBC: 0 % (ref 0.0–0.2)

## 2022-03-30 MED ORDER — PANTOPRAZOLE SODIUM 40 MG PO TBEC
40.0000 mg | DELAYED_RELEASE_TABLET | Freq: Every day | ORAL | 1 refills | Status: DC
  Filled 2022-03-30: qty 90, 90d supply, fill #0
  Filled 2022-06-25: qty 90, 90d supply, fill #1

## 2022-03-30 NOTE — Progress Notes (Signed)
[facility] OFFICE PROGRESS NOTE  Patient Care Team: Dorna Mai, MD as PCP - General (Family Medicine) O'Neal, Cassie Freer, MD as PCP - Cardiology (Cardiology) Arta Silence, MD as Consulting Physician (Gastroenterology)  ASSESSMENT & PLAN:  History of B-cell lymphoma Her last imaging study show no signs of cancer recurrence She has no signs or symptoms to suggest cancer recurrence She does not need long-term surveillance imaging study I will see her again in 12 months for further follow-up  Overweight (BMI 25.0-29.9) We discussed importance of risk factor modification in association with cancer recurrence She appears motivated for dietary changes and weight loss  No orders of the defined types were placed in this encounter.   All questions were answered. The patient knows to call the clinic with any problems, questions or concerns. The total time spent in the appointment was 20 minutes encounter with patients including review of chart and various tests results, discussions about plan of care and coordination of care plan   Hannah Lark, MD 03/30/2022 9:12 AM  INTERVAL HISTORY: Please see below for problem oriented charting. she returns for follow-up for history of lymphoma Since last time I saw her, she feels fine No new lymphadenopathy She is fully retired and active with church activities We discussed importance of dietary modification and lifestyle changes to reduce risk of lymphoma relapse  REVIEW OF SYSTEMS:   Constitutional: Denies fevers, chills or abnormal weight loss Eyes: Denies blurriness of vision Ears, nose, mouth, throat, and face: Denies mucositis or sore throat Respiratory: Denies cough, dyspnea or wheezes Cardiovascular: Denies palpitation, chest discomfort or lower extremity swelling Gastrointestinal:  Denies nausea, heartburn or change in bowel habits Skin: Denies abnormal skin rashes Lymphatics: Denies new lymphadenopathy or easy  bruising Neurological:Denies numbness, tingling or new weaknesses Behavioral/Psych: Mood is stable, no new changes  All other systems were reviewed with the patient and are negative.  I have reviewed the past medical history, past surgical history, social history and family history with the patient and they are unchanged from previous note.  ALLERGIES:  is allergic to simvastatin, lisinopril, and sertraline hcl.  MEDICATIONS:  Current Outpatient Medications  Medication Sig Dispense Refill   fenofibrate (TRICOR) 48 MG tablet Take 1 tablet (48 mg total) by mouth daily. 90 tablet 1   hydrochlorothiazide (HYDRODIURIL) 25 MG tablet Take 1 tablet by mouth every morning. 90 tablet 1   LORazepam (ATIVAN) 1 MG tablet Take 0.5 mg by mouth at bedtime as needed (for sleep).      metroNIDAZOLE (METROCREAM) 0.75 % cream Apply dime-size amount to face twice daily and taper use to daily once improved. 45 g 3   ondansetron (ZOFRAN) 4 MG tablet Take 1 tablet (4 mg total) by mouth every 8 (eight) hours as needed for nausea or vomiting. 20 tablet 1   ONETOUCH ULTRA test strip USE TO TEST YOUR BLOOD SUGAR ONCE A DAY 100 each 2   pantoprazole (PROTONIX) 40 MG tablet TAKE 1 TABLET BY MOUTH EVERY DAY 90 tablet 1   zolpidem (AMBIEN) 5 MG tablet Take 1 tablet (5 mg total) by mouth at bedtime as needed for sleep. 45 tablet 0   No current facility-administered medications for this visit.    SUMMARY OF ONCOLOGIC HISTORY: Oncology History Overview Note  Lymphoma, high grade involving the terminal ileum   Primary site: Lymphoid Neoplasms   Staging method: AJCC 6th Edition   Clinical: Stage I signed by Hannah Lark, MD on 04/23/2013  1:17 PM  Pathologic: Stage I signed by Hannah Lark, MD on 04/23/2013  1:17 PM   Summary: Stage I     History of B-cell lymphoma  04/28/2009 Procedure   Colonoscopy and biopsy show possible lymphoma involving the terminal ileum   05/08/2009 Bone Marrow Biopsy   Bone marrow biopsy was  negative.   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   06/06/2009 - 09/22/2009 Chemotherapy   The patient completed 6 cycles of R. CHOP chemotherapy.   06/12/2009 Imaging   PET CT scan show no evidence of residual disease.   10/16/2009 Imaging   PET CT scan show no evidence of residual disease.   06/01/2012 Procedure   Colonoscopy showed no evidence of recurrence of disease.   10/24/2012 Imaging   CT scan show no evidence of recurrence of disease.   10/11/2013 Imaging   CT scan show no evidence of disease.   04/22/2015 Imaging   CT scan show no evidence of disease.   05/02/2020 Imaging   Circumferential wall thickening involving the second and third portion of the duodenum suggestive of an infectious or inflammatory duodenitis. No evidence of obstruction or perforation.   Mild hepatic steatosis.   Aortic Atherosclerosis (ICD10-I70.0).     05/19/2020 Procedure   She underwent EGD by Dr. Paulita Fujita at [facility] Findings The examined esophagus was normal Patchy mild inflammation was found in the gastric fundus, in the gastric body and in the gastric antrum Biopsies were taken with a cold forceps for histology The exam of the stomach was otherwise normal The duodenal bulb, first portion of the duodenum, second portion of the duodenum and third portion of the duodenum were normal   08/13/2020 Imaging   1. No findings in the abdomen or pelvis to suggest recurrence of patient's lymphoma. 2. Resolution of previously visualized circumferential wall thickening involving the duodenum. 3. Moderate volume of formed stool throughout the colon. 4.  Aortic Atherosclerosis (ICD10-I70.0).       PHYSICAL EXAMINATION: ECOG PERFORMANCE STATUS: 0 - Asymptomatic  Vitals:   03/30/22 0836  BP: (!) 135/92  Pulse: 70  Resp: 18  Temp: (!) 97.5 F (36.4 C)  SpO2: 98%    Filed Weights   03/30/22 0836  Weight: 180 lb 1.6 oz (81.7 kg)    GENERAL:alert, no distress and comfortable SKIN: skin color, texture, turgor are normal, no rashes or significant lesions EYES: normal, Conjunctiva are pink and non-injected, sclera clear OROPHARYNX:no exudate, no erythema and lips, buccal mucosa, and tongue normal  NECK: supple, thyroid normal size, non-tender, without nodularity LYMPH:  no palpable lymphadenopathy in the cervical, axillary or inguinal LUNGS: clear to auscultation and percussion with normal breathing effort HEART: regular rate & rhythm and no murmurs and no lower extremity edema ABDOMEN:abdomen soft, non-tender and normal bowel sounds Musculoskeletal:no cyanosis of digits and no clubbing  NEURO: alert & oriented x 3 with fluent speech, no focal motor/sensory deficits  LABORATORY DATA:  I have reviewed the data as listed    Component Value Date/Time   NA 140 03/30/2022 0816   NA 140 02/04/2022 1116   NA 139 04/08/2016 1115   K 3.7 03/30/2022 0816   K 3.7 04/08/2016 1115   CL 102 03/30/2022 0816   CL 103 04/10/2012 0824   CO2 32 03/30/2022 0816   CO2 31 (H) 04/08/2016 1115   GLUCOSE 135 (H) 03/30/2022 0816   GLUCOSE 111 04/08/2016 1115  GLUCOSE 111 (H) 04/10/2012 0824   BUN 10 03/30/2022 0816   BUN 13 02/04/2022 1116   BUN 12.8 04/08/2016 1115   CREATININE 0.95 03/30/2022 0816   CREATININE 0.89 04/24/2020 1001   CREATININE 0.9 04/08/2016 1115   CALCIUM 9.6 03/30/2022 0816   CALCIUM 9.8 04/08/2016 1115   PROT 7.9 03/30/2022 0816   PROT 7.4 01/07/2021 1118   PROT 8.0 04/08/2016 1115   ALBUMIN 4.3 03/30/2022 0816   ALBUMIN 4.3 01/07/2021 1118   ALBUMIN 4.0 04/08/2016 1115   AST 19 03/30/2022 0816   AST 23 04/24/2020 1001   AST 22 04/08/2016 1115   ALT 17 03/30/2022 0816   ALT 20 04/24/2020 1001   ALT 23 04/08/2016 1115   ALKPHOS 68 03/30/2022 0816   ALKPHOS 79 04/08/2016 1115   BILITOT 0.7 03/30/2022 0816   BILITOT 0.8  01/07/2021 1118   BILITOT 1.4 (H) 04/24/2020 1001   BILITOT 1.23 (H) 04/08/2016 1115   GFRNONAA >60 03/30/2022 0816   GFRNONAA >60 04/24/2020 1001   GFRAA >60 04/09/2019 0758    No results found for: "SPEP", "UPEP"  Lab Results  Component Value Date   WBC 6.6 03/30/2022   NEUTROABS 2.7 03/30/2022   HGB 15.1 (H) 03/30/2022   HCT 44.3 03/30/2022   MCV 86.5 03/30/2022   PLT 287 03/30/2022      Chemistry      Component Value Date/Time   NA 140 03/30/2022 0816   NA 140 02/04/2022 1116   NA 139 04/08/2016 1115   K 3.7 03/30/2022 0816   K 3.7 04/08/2016 1115   CL 102 03/30/2022 0816   CL 103 04/10/2012 0824   CO2 32 03/30/2022 0816   CO2 31 (H) 04/08/2016 1115   BUN 10 03/30/2022 0816   BUN 13 02/04/2022 1116   BUN 12.8 04/08/2016 1115   CREATININE 0.95 03/30/2022 0816   CREATININE 0.89 04/24/2020 1001   CREATININE 0.9 04/08/2016 1115      Component Value Date/Time   CALCIUM 9.6 03/30/2022 0816   CALCIUM 9.8 04/08/2016 1115   ALKPHOS 68 03/30/2022 0816   ALKPHOS 79 04/08/2016 1115   AST 19 03/30/2022 0816   AST 23 04/24/2020 1001   AST 22 04/08/2016 1115   ALT 17 03/30/2022 0816   ALT 20 04/24/2020 1001   ALT 23 04/08/2016 1115   BILITOT 0.7 03/30/2022 0816   BILITOT 0.8 01/07/2021 1118   BILITOT 1.4 (H) 04/24/2020 1001   BILITOT 1.23 (H) 04/08/2016 1115

## 2022-03-30 NOTE — Assessment & Plan Note (Signed)
Her last imaging study show no signs of cancer recurrence She has no signs or symptoms to suggest cancer recurrence She does not need long-term surveillance imaging study I will see her again in 12 months for further follow-up

## 2022-03-30 NOTE — Assessment & Plan Note (Signed)
We discussed importance of risk factor modification in association with cancer recurrence She appears motivated for dietary changes and weight loss

## 2022-04-06 ENCOUNTER — Ambulatory Visit
Admission: RE | Admit: 2022-04-06 | Discharge: 2022-04-06 | Disposition: A | Discharge status: Home / Self Care | Payer: PPO | Source: Ambulatory Visit | Attending: Family Medicine | Admitting: Family Medicine

## 2022-04-06 DIAGNOSIS — Z1231 Encounter for screening mammogram for malignant neoplasm of breast: Secondary | ICD-10-CM | POA: Diagnosis not present

## 2022-04-09 ENCOUNTER — Other Ambulatory Visit: Payer: Self-pay | Admitting: Family Medicine

## 2022-04-09 DIAGNOSIS — R928 Other abnormal and inconclusive findings on diagnostic imaging of breast: Secondary | ICD-10-CM

## 2022-04-22 ENCOUNTER — Other Ambulatory Visit: Payer: Self-pay | Admitting: Family

## 2022-04-22 DIAGNOSIS — G4700 Insomnia, unspecified: Secondary | ICD-10-CM

## 2022-04-23 ENCOUNTER — Ambulatory Visit
Admission: RE | Admit: 2022-04-23 | Discharge: 2022-04-23 | Disposition: A | Discharge status: Home / Self Care | Payer: PPO | Source: Ambulatory Visit | Attending: Family Medicine | Admitting: Family Medicine

## 2022-04-23 ENCOUNTER — Other Ambulatory Visit: Payer: Self-pay | Admitting: Family Medicine

## 2022-04-23 DIAGNOSIS — R928 Other abnormal and inconclusive findings on diagnostic imaging of breast: Secondary | ICD-10-CM

## 2022-04-23 DIAGNOSIS — N6322 Unspecified lump in the left breast, upper inner quadrant: Secondary | ICD-10-CM | POA: Diagnosis not present

## 2022-04-23 DIAGNOSIS — N6320 Unspecified lump in the left breast, unspecified quadrant: Secondary | ICD-10-CM

## 2022-04-23 NOTE — Telephone Encounter (Signed)
Requested medication (s) are due for refill today: yes  Requested medication (s) are on the active medication list: yes    Last refill: 01/21/22  #45  0 refills  Future visit scheduled yes 08/05/22  Notes to clinic:Not delegated, please review. Thank you.  Requested Prescriptions  Pending Prescriptions Disp Refills   zolpidem (AMBIEN) 5 MG tablet 45 tablet 0    Sig: Take 1 tablet (5 mg total) by mouth at bedtime as needed for sleep.     Not Delegated - Psychiatry:  Anxiolytics/Hypnotics Failed - 04/22/2022  8:24 PM      Failed - This refill cannot be delegated      Failed - Urine Drug Screen completed in last 360 days      Passed - Valid encounter within last 6 months    Recent Outpatient Visits           2 months ago Diet-controlled diabetes mellitus ([place])   [facility] at [facility], MD   9 months ago Diet-controlled diabetes mellitus [facility])   [facility], MD   1 year ago Insomnia, unspecified type   [facility], MD   1 year ago Encounter for annual physical exam   [place] [facility], MD   1 year ago Diet-controlled diabetes mellitus [facility])   [facility], MD       Future Appointments             In 3 months Dorna Mai, MD [facility]

## 2022-04-26 ENCOUNTER — Encounter: Payer: Self-pay | Admitting: Family Medicine

## 2022-04-28 ENCOUNTER — Other Ambulatory Visit: Payer: Self-pay | Admitting: Family Medicine

## 2022-04-28 DIAGNOSIS — G4700 Insomnia, unspecified: Secondary | ICD-10-CM

## 2022-04-28 MED ORDER — ZOLPIDEM TARTRATE 5 MG PO TABS: 5.0000 mg | ORAL_TABLET | Freq: Every evening | ORAL | 0 refills | Status: DC | PRN

## 2022-04-28 NOTE — Telephone Encounter (Signed)
done

## 2022-04-29 ENCOUNTER — Other Ambulatory Visit (HOSPITAL_COMMUNITY): Payer: Self-pay

## 2022-04-29 ENCOUNTER — Encounter: Payer: Self-pay | Admitting: Family Medicine

## 2022-04-29 MED ORDER — ONETOUCH ULTRA VI STRP
ORAL_STRIP | 2 refills | Status: DC
  Filled 2022-04-29: qty 100, 90d supply, fill #0
  Filled 2022-07-21: qty 100, 90d supply, fill #1
  Filled 2022-10-23: qty 100, 100d supply, fill #2

## 2022-04-30 ENCOUNTER — Other Ambulatory Visit: Payer: Self-pay

## 2022-04-30 ENCOUNTER — Other Ambulatory Visit (HOSPITAL_COMMUNITY): Payer: Self-pay

## 2022-05-11 ENCOUNTER — Other Ambulatory Visit: Payer: Self-pay

## 2022-06-04 ENCOUNTER — Encounter: Payer: Self-pay | Admitting: Family Medicine

## 2022-06-04 ENCOUNTER — Other Ambulatory Visit (HOSPITAL_COMMUNITY): Payer: Self-pay

## 2022-06-04 MED ORDER — FENOFIBRATE 48 MG PO TABS
48.0000 mg | ORAL_TABLET | Freq: Every day | ORAL | 1 refills | Status: DC
  Filled 2022-06-04: qty 90, 90d supply, fill #0
  Filled 2022-10-04: qty 90, 90d supply, fill #1

## 2022-06-04 NOTE — Telephone Encounter (Signed)
RX sent

## 2022-06-07 ENCOUNTER — Encounter: Payer: Self-pay | Admitting: Pharmacist

## 2022-06-07 ENCOUNTER — Other Ambulatory Visit: Payer: Self-pay

## 2022-06-07 ENCOUNTER — Other Ambulatory Visit (HOSPITAL_COMMUNITY): Payer: Self-pay

## 2022-06-16 ENCOUNTER — Encounter: Payer: Self-pay | Admitting: Physician Assistant

## 2022-06-16 ENCOUNTER — Ambulatory Visit: Payer: PPO | Attending: Physician Assistant | Admitting: Physician Assistant

## 2022-06-16 VITALS — BP 122/74 | HR 70 | Ht 66.0 in | Wt 180.8 lb

## 2022-06-16 DIAGNOSIS — E785 Hyperlipidemia, unspecified: Secondary | ICD-10-CM | POA: Diagnosis not present

## 2022-06-16 DIAGNOSIS — I10 Essential (primary) hypertension: Secondary | ICD-10-CM

## 2022-06-16 DIAGNOSIS — R0609 Other forms of dyspnea: Secondary | ICD-10-CM | POA: Diagnosis not present

## 2022-06-16 DIAGNOSIS — I2510 Atherosclerotic heart disease of native coronary artery without angina pectoris: Secondary | ICD-10-CM | POA: Diagnosis not present

## 2022-06-16 NOTE — Patient Instructions (Signed)
Medication Instructions:  Your physician recommends that you continue on your current medications as directed. Please refer to the Current Medication list given to you today.  *If you need a refill on your cardiac medications before your next appointment, please call your pharmacy*   Testing/Procedures: Your physician has requested that you have an echocardiogram. Echocardiography is a painless test that uses sound waves to create images of your heart. It provides your doctor with information about the size and shape of your heart and how well your heart's chambers and valves are working. This procedure takes approximately one hour. There are no restrictions for this procedure. Please do NOT wear cologne, perfume, aftershave, or lotions (deodorant is allowed). Please arrive 15 minutes prior to your appointment time. This will take place at [address] [place]    Follow-Up: At [facility], you and your health needs are our priority.  As part of our continuing mission to provide you with exceptional heart care, we have created designated Provider Care Teams.  These Care Teams include your primary Cardiologist (physician) and Advanced Practice Providers (APPs -  Physician Assistants and Nurse Practitioners) who all work together to provide you with the care you need, when you need it.  We recommend signing up for the patient portal called "MyChart".  Sign up information is provided on this After Visit Summary.  MyChart is used to connect with patients for Virtual Visits (Telemedicine).  Patients are able to view lab/test results, encounter notes, upcoming appointments, etc.  Non-urgent messages can be sent to your provider as well.   To learn more about what you can do with MyChart, go to [email or web address].    Your next appointment:   6 week(s)  Provider:   Azalee Course, PA-C      Other Instructions Wynema Birch recommends that you see our clinical PharmD to discuss cholesterol  therapy.

## 2022-06-16 NOTE — Progress Notes (Unsigned)
Cardiology Office Note:    Date:  06/17/2022   ID:  Hannah Frazier, DOB July 22, 1950, MRN [id number]  PCP:  Georganna Skeans, MD   [facility] Providers Cardiologist:  Reatha Harps, MD     Referring MD: Georganna Skeans, MD   No chief complaint on file.   History of Present Illness:    Hannah Frazier is a 72 y.o. female with a hx of hypertension, hyperlipidemia, DM2, CAD, B-cell lymphoma and palpitation. Patient was previously referred to Dr. Flora Lipps for evaluation of palpitation. 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 Patient was last seen by Dr. Bufford Buttner in August 2020, given infrequent episode of rapid heartbeat, no aggressive work-up was performed. Echocardiogram obtained on 09/14/2018 showed EF 60 to 65%, no significant valve issue.   I last saw the patient in January 2023 for chest pain evaluation.  Symptom occurred while resting instead of with exertion.  Subsequent coronary CT obtained on 03/03/2021 demonstrated coronary calcium score 312 involving 3 major vessels, which placed the patient at 88th percentile for age and sex matched control, 1 to 24% proximal to mid LAD lesion, 1 to 24% proximal D1 lesion, 24 to 49% mid D1 lesion, 40 to 59% mid left circumflex lesion, 1 to 24% RCA lesion.  No extracardiac acute finding on radiology over read.  Patient presents today for follow-up.  In the past 6 months, she has been having worsening dyspnea on exertion.  She says she can still walk half a mile on the flat ground however she get dyspneic after certain distance.  Family encouraged her to exercise, however she does not like to exercise.  She still working in her yard and cleaning dishes.  Recent lipid panel showed uncontrolled cholesterol.  She has myalgia with pravastatin and swelling and a rash associated with simvastatin.  She was started on fenofibrate by her PCP.  Given history of coronary artery disease, she clearly needs something stronger in order to get LDL  down to less than 70.  I will refer her to lipid clinic.  As far as her shortness of breath with exertion, I will start with an echocardiogram.  Recent blood work obtained in March showed no sign of anemia and normal electrolyte/renal function.  If echocardiogram come back normal, I would encourage increase activity level.  Given lack of chest pain, I do not think she need another coronary CT.  Note, EKG today shows new right bundle branch block, this is also another reason to get echocardiogram as well.  I will see her back in 6 weeks.  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   chemo comp 08/2009   Vision abnormalities     Past Surgical History:  Procedure Laterality Date   ABDOMINAL HYSTERECTOMY     COLECTOMY      Current Medications: Current Meds  Medication Sig   famotidine (PEPCID) 20 MG tablet 1 tablet at bedtime as needed Orally Once a day   fenofibrate (TRICOR) 48 MG tablet Take 1 tablet (48 mg total) by mouth daily.   hydrochlorothiazide (HYDRODIURIL) 25 MG tablet Take 1 tablet by mouth every morning.   LORazepam (ATIVAN) 1 MG tablet Take 0.5 mg by mouth at bedtime as needed (for sleep).  metroNIDAZOLE (METROCREAM) 0.75 % cream Apply dime-size amount to face twice daily and taper use to daily once improved.   ondansetron (ZOFRAN) 4 MG tablet Take 1 tablet (4 mg total) by mouth every 8 (eight) hours as needed for nausea or vomiting.   ONETOUCH ULTRA test strip USE TO TEST YOUR BLOOD SUGAR ONCE A DAY   pantoprazole (PROTONIX) 40 MG tablet Take 1 tablet (40 mg total) by mouth daily.   zolpidem (AMBIEN) 5 MG tablet Take 1 tablet (5 mg total) by mouth at bedtime as needed for sleep.     Allergies:   Simvastatin, Lisinopril, and Sertraline hcl   Social History    Socioeconomic History   Marital status: Divorced    Spouse name: Not on file   Number of children: Not on file   Years of education: Not on file   Highest education level: Not on file  Occupational History   Not on file  Tobacco Use   Smoking status: Never   Smokeless tobacco: Never  Substance and Sexual Activity   Alcohol use: No   Drug use: No   Sexual activity: Not on file  Other Topics Concern   Not on file  Social History Narrative   Not on file   Social Determinants of Health   Financial Resource Strain: Low Risk  (01/08/2022)   Overall Financial Resource Strain (CARDIA)    Difficulty of Paying Living Expenses: Not hard at all  Food Insecurity: No Food Insecurity (01/08/2022)   Hunger Vital Sign    Worried About Running Out of Food in the Last Year: Never true    Ran Out of Food in the Last Year: Never true  Transportation Needs: No Transportation Needs (01/08/2022)   PRAPARE - Administrator, Civil Service (Medical): No    Lack of Transportation (Non-Medical): No  Physical Activity: Inactive (01/08/2022)   Exercise Vital Sign    Days of Exercise per Week: 0 days    Minutes of Exercise per Session: 0 min  Stress: No Stress Concern Present (01/08/2022)   [employer] of Occupational Health - Occupational Stress Questionnaire    Feeling of Stress : Not at all  Social Connections: Moderately Integrated (01/08/2022)   Social Connection and Isolation Panel [NHANES]    Frequency of Communication with Friends and Family: More than three times a week    Frequency of Social Gatherings with Friends and Family: More than three times a week    Attends Religious Services: More than 4 times per year    Active Member of [employer] or Organizations: Yes    Attends Engineer, structural: More than 4 times per year    Marital Status: Divorced     Family History: The patient's family history includes Cancer in her mother; Heart disease in her father;  Parkinson's disease in her mother.  ROS:   Please see the history of present illness.     All other systems reviewed and are negative.  EKGs/Labs/Other Studies Reviewed:    The following studies were reviewed today:  Coronary CT 03/03/2021 FINDINGS: Non-cardiac: See separate report from [facility]. No significant findings on limited lung and soft tissue windows.   Calcium Score: 3 vessel coronary calcium   LM: 0   LAD: 18.2   LCX: 148   RCA: 145   Total: 312   Coronary Arteries: Right dominant with no anomalies   LM: Normal   LAD: 1-24% calcified plaque in proximal and mid vessel  D1: Large vessel 1-24% calcified plaque proximally 24-49% soft plaque mid vessel   Circumflex: 40-59% calcified plaque in mid vessel   OM1: Normal   OM2: Normal   RCA: 1-24% proximal/mid and distal calcified stenosis   PDA: Normal   PLA: Normal   IMPRESSION: 1. Calcium score 312 involving all 3 major epicardial vessels 88 th percentile for age/sex   2. CAD RADS 2 nonobstructive CAD in D1 and mid LCX see description above   3.  Normal ascending thoracic aorta 2.8 cm  EKG:  EKG is ordered today.  The ekg ordered today demonstrates normal sinus rhythm, right bundle branch block.  Recent Labs: 03/30/2022: ALT 17; BUN 10; Creatinine, Ser 0.95; Hemoglobin 15.1; Platelets 287; Potassium 3.7; Sodium 140  Recent Lipid Panel    Component Value Date/Time   CHOL 258 (H) 02/04/2022 1116   TRIG 243 (H) 02/04/2022 1116   HDL 36 (L) 02/04/2022 1116   CHOLHDL 7.2 (H) 02/04/2022 1116   LDLCALC 176 (H) 02/04/2022 1116     Risk Assessment/Calculations:           Physical Exam:    VS:  BP 122/74   Pulse 70   Ht 5\' 6"  (1.676 m)   Wt 180 lb 12.8 oz (82 kg)   SpO2 100%   BMI 29.18 kg/m         Wt Readings from Last 3 Encounters:  06/16/22 180 lb 12.8 oz (82 kg)  03/30/22 180 lb 1.6 oz (81.7 kg)  02/04/22 185 lb (83.9 kg)     GEN:  Well nourished, well developed  in no acute distress HEENT: Normal NECK: No JVD; No carotid bruits LYMPHATICS: No lymphadenopathy CARDIAC: RRR, no murmurs, rubs, gallops RESPIRATORY:  Clear to auscultation without rales, wheezing or rhonchi  ABDOMEN: Soft, non-tender, non-distended MUSCULOSKELETAL:  No edema; No deformity  SKIN: Warm and dry NEUROLOGIC:  Alert and oriented x 3 PSYCHIATRIC:  Normal affect   ASSESSMENT:    1. Dyspnea on exertion   2. Hyperlipidemia LDL goal <70   3. Coronary artery disease involving native coronary artery of native heart without angina pectoris   4. Primary hypertension    PLAN:    In order of problems listed above:  Dyspnea on exertion: Obtain echocardiogram.  Given lack of chest pain, we will hold off on repeat coronary CT.  Hyperlipidemia: Will refer to lipid clinic.  Patient has a history of statin intolerance  CAD: Mild disease noted on previous coronary CT in February 2023.  Given lack of chest pain, will hold off on repeat coronary CT  Hypertension: Blood pressure stable.           Medication Adjustments/Labs and Tests Ordered: Current medicines are reviewed at length with the patient today.  Concerns regarding medicines are outlined above.  Orders Placed This Encounter  Procedures   AMB Referral to [facility] Pharm-D   EKG 12-Lead   ECHOCARDIOGRAM COMPLETE   No orders of the defined types were placed in this encounter.   Patient Instructions  Medication Instructions:  Your physician recommends that you continue on your current medications as directed. Please refer to the Current Medication list given to you today.  *If you need a refill on your cardiac medications before your next appointment, please call your pharmacy*   Testing/Procedures: Your physician has requested that you have an echocardiogram. Echocardiography is a painless test that uses sound waves to create images of your heart. It provides your doctor with information about the  size and shape  of your heart and how well your heart's chambers and valves are working. This procedure takes approximately one hour. There are no restrictions for this procedure. Please do NOT wear cologne, perfume, aftershave, or lotions (deodorant is allowed). Please arrive 15 minutes prior to your appointment time. This will take place at [address] [place]    Follow-Up: At [facility], you and your health needs are our priority.  As part of our continuing mission to provide you with exceptional heart care, we have created designated Provider Care Teams.  These Care Teams include your primary Cardiologist (physician) and Advanced Practice Providers (APPs -  Physician Assistants and Nurse Practitioners) who all work together to provide you with the care you need, when you need it.  We recommend signing up for the patient portal called "MyChart".  Sign up information is provided on this After Visit Summary.  MyChart is used to connect with patients for Virtual Visits (Telemedicine).  Patients are able to view lab/test results, encounter notes, upcoming appointments, etc.  Non-urgent messages can be sent to your provider as well.   To learn more about what you can do with MyChart, go to [email or web address].    Your next appointment:   6 week(s)  Provider:   Azalee Course, PA-C      Other Instructions Wynema Birch recommends that you see our clinical PharmD to discuss cholesterol therapy.     Ramond Dial, [place]  06/17/2022 10:20 PM    [facility]

## 2022-06-18 ENCOUNTER — Ambulatory Visit: Payer: PPO | Attending: Cardiology | Admitting: Pharmacist

## 2022-06-18 ENCOUNTER — Telehealth: Payer: Self-pay

## 2022-06-18 ENCOUNTER — Telehealth: Payer: Self-pay | Admitting: Pharmacist

## 2022-06-18 ENCOUNTER — Encounter: Payer: Self-pay | Admitting: Pharmacist

## 2022-06-18 ENCOUNTER — Other Ambulatory Visit (HOSPITAL_COMMUNITY): Payer: Self-pay

## 2022-06-18 DIAGNOSIS — M791 Myalgia, unspecified site: Secondary | ICD-10-CM

## 2022-06-18 DIAGNOSIS — I2510 Atherosclerotic heart disease of native coronary artery without angina pectoris: Secondary | ICD-10-CM | POA: Insufficient documentation

## 2022-06-18 DIAGNOSIS — E7800 Pure hypercholesterolemia, unspecified: Secondary | ICD-10-CM | POA: Diagnosis not present

## 2022-06-18 DIAGNOSIS — R9389 Abnormal findings on diagnostic imaging of other specified body structures: Secondary | ICD-10-CM | POA: Insufficient documentation

## 2022-06-18 DIAGNOSIS — Z888 Allergy status to other drugs, medicaments and biological substances status: Secondary | ICD-10-CM

## 2022-06-18 DIAGNOSIS — T466X5A Adverse effect of antihyperlipidemic and antiarteriosclerotic drugs, initial encounter: Secondary | ICD-10-CM | POA: Diagnosis not present

## 2022-06-18 NOTE — Patient Instructions (Addendum)
It was nice meeting you today  We would like to start you on a new medication called Repatha which you would inject once every 2 weeks  I will complete the prior authorization and let you know when it is approved  Once you start the medication, we will recheck your cholesterol in about 2-3 months   Please let me know if you have any questions  Laural Golden, PharmD, BCACP, CDCES, CPP [address] [place], [place], [address] Phone: [phone], Fax: [phone]

## 2022-06-18 NOTE — Telephone Encounter (Signed)
Pharmacy Patient Advocate Encounter   Received notification from [facility] that prior authorization for REPATHA 140MG /ML is required/requested.   PA submitted on 5.24.24 to (ins) HTA via CoverMyMeds Key or (Medicaid) confirmation # [id number]   Status is pending

## 2022-06-18 NOTE — Telephone Encounter (Signed)
Please complete PA for Repatha 

## 2022-06-18 NOTE — Progress Notes (Signed)
Patient ID: Hannah Frazier                 DOB: 1950-12-16                    MRN: [id number]     HPI: Hannah Frazier is a 72 y.o. female patient referred to lipid clinic by Azalee Course. PMH is significant for HLD, CAD, HTN, B-cell lymphoma, T2DM (last A1c 6.2) and statin intolerance.   Patient presents today to discuss lipid management. Has tried pravastatin which caused muscle pain and simvastatin which she had an allergic reaction to. Currently only managed on fenofibrate 48mg  daily.  Has tried to manage cholesterol with diet but has not been successful. Recent CT showed elevated coronary calcium score.  Concerned regarding medication costs.  Current Medications: Fenofibrate 48mg  daily  Intolerances:  Pravastatin Simvastatin  Risk Factors:  CAD HLD T2DM  LDL goal: <55  Labs: TC 258, Trigs 243, HDL 36, LDL 176 (02/04/22)  Imaging: Calcium score 312 involving all 3 major epicardial vessels 88 th percentile for age/sex   CAD RADS 2 nonobstructive CAD in D1 and mid LCX   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   chemo comp 08/2009   Vision abnormalities     Current Outpatient Medications on File Prior to Visit  Medication Sig Dispense Refill   famotidine (PEPCID) 20 MG tablet 1 tablet at bedtime as needed Orally Once a day     fenofibrate (TRICOR) 48 MG tablet Take 1 tablet (48 mg total) by mouth daily. 90 tablet 1   hydrochlorothiazide (HYDRODIURIL) 25 MG tablet Take 1 tablet by mouth every morning. 90 tablet 1   LORazepam (ATIVAN) 1 MG tablet Take 0.5 mg by mouth at bedtime as needed (for sleep).      metroNIDAZOLE (METROCREAM) 0.75 % cream Apply dime-size amount to face twice daily and taper use to daily once improved. 45 g 3   ondansetron (ZOFRAN) 4 MG  tablet Take 1 tablet (4 mg total) by mouth every 8 (eight) hours as needed for nausea or vomiting. 20 tablet 1   ONETOUCH ULTRA test strip USE TO TEST YOUR BLOOD SUGAR ONCE A DAY 100 each 2   pantoprazole (PROTONIX) 40 MG tablet Take 1 tablet (40 mg total) by mouth daily. 90 tablet 1   zolpidem (AMBIEN) 5 MG tablet Take 1 tablet (5 mg total) by mouth at bedtime as needed for sleep. 45 tablet 0   [DISCONTINUED] potassium chloride SA (K-DUR,KLOR-CON) 20 MEQ tablet Take 1 tablet (20 mEq total) by mouth 2 (two) times daily. 60 tablet 0   No current facility-administered medications on file prior to visit.    Allergies  Allergen Reactions   Simvastatin Swelling and Rash    Tongue swelling  Tongue swelling Tongue swelling    Lisinopril Nausea Only and Other (See Comments)   Sertraline Hcl Other (See Comments)    Assessment/Plan:  1. Hyperlipidemia - Patient's most recent LDL 176 which is well above goal of <55.  Aggressive goal selected due to T2DM and elevated coronary calcium score. Statin intolerant. Unlikely will reach LDL goal using ezetimibe or bempedoic acid. Recommend starting PCSK9i.  Using [employer], educated patient on mechanism of action, storage,  site selection, administration, and possible adverse effects. Patient was able to demonstrate in room. Will complete PA and update patient when approved. Applied patient for grant assistance in room to help with out of pocket costs and was approved. Recheck lipid panel in 2-3 months.  Continue fenofibrate 48mg  daily Start Repatha 140mg  q 2 weeks Recheck lipid panel in 2-3 months  Laural Golden, PharmD, BCACP, CDCES, CPP [address] [place], [place], [address] Phone: [phone], Fax: [phone]

## 2022-06-22 ENCOUNTER — Telehealth: Payer: Self-pay

## 2022-06-22 ENCOUNTER — Other Ambulatory Visit (HOSPITAL_COMMUNITY): Payer: Self-pay

## 2022-06-22 MED ORDER — REPATHA SURECLICK 140 MG/ML ~~LOC~~ SOAJ
140.0000 mg | SUBCUTANEOUS | 11 refills | Status: DC
  Filled 2022-06-22: qty 2, 28d supply, fill #0
  Filled 2022-07-21: qty 2, 28d supply, fill #1
  Filled 2022-08-15: qty 2, 28d supply, fill #2
  Filled 2022-09-16: qty 2, 28d supply, fill #3

## 2022-06-22 NOTE — Telephone Encounter (Signed)
Pharmacy Patient Advocate Encounter   Prior Authorization for REPATHA 140MG /ML has been approved by HTA (ins).     KEY# [id number] Effective dates: 5.24.24 through 11.24.24

## 2022-06-22 NOTE — Telephone Encounter (Signed)
Patient called and left a message wanting you to call her regarding Repatha medication.  Thank you

## 2022-06-22 NOTE — Telephone Encounter (Signed)
Pharmacy Patient Advocate Encounter   Prior Authorization for REPATHA 140MG/ML has been approved by HTA (ins).     KEY# [id number] Effective dates: 5.24.24 through 11.24.24 

## 2022-06-25 ENCOUNTER — Other Ambulatory Visit: Payer: Self-pay

## 2022-07-15 ENCOUNTER — Telehealth (HOSPITAL_COMMUNITY): Payer: Self-pay | Admitting: Physician Assistant

## 2022-07-15 NOTE — Telephone Encounter (Signed)
Patient called and cancelled echocardiogram scheduled for 07/19/22 and does not wish to reschedule. Order will be removed from the active echo wq. Thank you

## 2022-07-19 ENCOUNTER — Ambulatory Visit (HOSPITAL_COMMUNITY): Payer: PPO

## 2022-07-19 ENCOUNTER — Encounter (HOSPITAL_COMMUNITY): Payer: Self-pay

## 2022-07-21 ENCOUNTER — Other Ambulatory Visit (HOSPITAL_BASED_OUTPATIENT_CLINIC_OR_DEPARTMENT_OTHER): Payer: Self-pay

## 2022-07-21 ENCOUNTER — Other Ambulatory Visit: Payer: Self-pay | Admitting: Family Medicine

## 2022-07-21 DIAGNOSIS — G4700 Insomnia, unspecified: Secondary | ICD-10-CM

## 2022-07-21 NOTE — Telephone Encounter (Signed)
Medication Refill - Medication: zolpidem (AMBIEN) 5 MG tablet   Has the patient contacted their pharmacy? No.  Preferred Pharmacy (with phone number or street name):  [facility] Phone: [phone]  Fax: [phone]     Has the patient been seen for an appointment in the last year OR does the patient have an upcoming appointment? No.  Agent: Please be advised that RX refills may take up to 3 business days. We ask that you follow-up with your pharmacy.

## 2022-07-22 ENCOUNTER — Other Ambulatory Visit: Payer: Self-pay

## 2022-07-22 ENCOUNTER — Other Ambulatory Visit (HOSPITAL_COMMUNITY): Payer: Self-pay

## 2022-07-22 NOTE — Telephone Encounter (Signed)
Requested medication (s) are due for refill today: yes  Requested medication (s) are on the active medication list: yes  Last refill:  04/28/22  Future visit scheduled: yes  Notes to clinic:  Unable to refill per protocol, cannot delegate.      Requested Prescriptions  Pending Prescriptions Disp Refills   zolpidem (AMBIEN) 5 MG tablet 45 tablet 0    Sig: Take 1 tablet (5 mg total) by mouth at bedtime as needed for sleep.     Not Delegated - Psychiatry:  Anxiolytics/Hypnotics Failed - 07/21/2022  2:19 PM      Failed - This refill cannot be delegated      Failed - Urine Drug Screen completed in last 360 days      Passed - Valid encounter within last 6 months    Recent Outpatient Visits           5 months ago Diet-controlled diabetes mellitus (HCC)   [place] [facility], MD   12 months ago Diet-controlled diabetes mellitus [facility])   [place] [facility], MD   1 year ago Insomnia, unspecified type   [place] [facility], MD   1 year ago Encounter for annual physical exam   [place] [facility], MD   1 year ago Diet-controlled diabetes mellitus [facility])   [place] [facility], MD       Future Appointments             In 4 days Georganna Skeans, MD [facility]

## 2022-07-23 ENCOUNTER — Other Ambulatory Visit: Payer: Self-pay

## 2022-07-26 ENCOUNTER — Ambulatory Visit (INDEPENDENT_AMBULATORY_CARE_PROVIDER_SITE_OTHER): Payer: PPO | Admitting: Family Medicine

## 2022-07-26 ENCOUNTER — Other Ambulatory Visit: Payer: Self-pay

## 2022-07-26 ENCOUNTER — Encounter: Payer: Self-pay | Admitting: Family Medicine

## 2022-07-26 VITALS — BP 119/79 | HR 72 | Temp 98.1°F | Resp 16 | Wt 181.4 lb

## 2022-07-26 DIAGNOSIS — E119 Type 2 diabetes mellitus without complications: Secondary | ICD-10-CM

## 2022-07-26 DIAGNOSIS — G4700 Insomnia, unspecified: Secondary | ICD-10-CM | POA: Diagnosis not present

## 2022-07-26 DIAGNOSIS — E7800 Pure hypercholesterolemia, unspecified: Secondary | ICD-10-CM | POA: Diagnosis not present

## 2022-07-26 DIAGNOSIS — I10 Essential (primary) hypertension: Secondary | ICD-10-CM

## 2022-07-26 MED ORDER — ZOLPIDEM TARTRATE 5 MG PO TABS
5.0000 mg | ORAL_TABLET | Freq: Every evening | ORAL | 0 refills | Status: DC | PRN
Start: 2022-07-26 — End: 2022-11-02
  Filled 2022-07-26: qty 45, 45d supply, fill #0

## 2022-07-26 NOTE — Progress Notes (Unsigned)
Patient is here for their 6 month follow-up Patient has no concerns today Care gaps have been discussed with patient  

## 2022-07-27 ENCOUNTER — Encounter: Payer: Self-pay | Admitting: Pharmacist

## 2022-07-27 ENCOUNTER — Ambulatory Visit: Payer: PPO | Admitting: Physician Assistant

## 2022-07-27 LAB — BASIC METABOLIC PANEL
BUN/Creatinine Ratio: 8 — ABNORMAL LOW (ref 12–28)
BUN: 7 mg/dL — ABNORMAL LOW (ref 8–27)
CO2: 24 mmol/L (ref 20–29)
Calcium: 9.5 mg/dL (ref 8.7–10.3)
Chloride: 101 mmol/L (ref 96–106)
Creatinine, Ser: 0.9 mg/dL (ref 0.57–1.00)
Glucose: 105 mg/dL — ABNORMAL HIGH (ref 70–99)
Potassium: 4.4 mmol/L (ref 3.5–5.2)
Sodium: 139 mmol/L (ref 134–144)
eGFR: 69 mL/min/{1.73_m2} (ref >59)

## 2022-07-27 LAB — HEMOGLOBIN A1C
Est. average glucose Bld gHb Est-mCnc: 128 mg/dL
Hgb A1c MFr Bld: 6.1 % — ABNORMAL HIGH (ref 4.8–5.6)

## 2022-07-27 LAB — LIPID PANEL
Chol/HDL Ratio: 4 ratio (ref 0.0–4.4)
Cholesterol, Total: 174 mg/dL (ref 100–199)
HDL: 43 mg/dL (ref >39)
LDL Chol Calc (NIH): 100 mg/dL — ABNORMAL HIGH (ref 0–99)
Triglycerides: 176 mg/dL — ABNORMAL HIGH (ref 0–149)
VLDL Cholesterol Cal: 31 mg/dL (ref 5–40)

## 2022-07-28 ENCOUNTER — Encounter: Payer: Self-pay | Admitting: Family Medicine

## 2022-07-28 NOTE — Progress Notes (Signed)
Established Patient Office Visit  Subjective    Patient ID: Hannah Frazier, female    DOB: 03/15/1950  Age: 72 y.o. MRN: [id number]  CC: No chief complaint on file.   HPI Hannah Frazier presents for follow up of chronic med issues. Patient denies acute complaints or concerns.    Outpatient Encounter Medications as of 07/26/2022  Medication Sig   Evolocumab (REPATHA SURECLICK) 140 MG/ML SOAJ Inject 140 mg into the skin every 14 (fourteen) days.   fenofibrate (TRICOR) 48 MG tablet Take 1 tablet (48 mg total) by mouth daily.   hydrochlorothiazide (HYDRODIURIL) 25 MG tablet Take 1 tablet by mouth every morning.   LORazepam (ATIVAN) 1 MG tablet Take 0.5 mg by mouth at bedtime as needed (for sleep).    ondansetron (ZOFRAN) 4 MG tablet Take 1 tablet (4 mg total) by mouth every 8 (eight) hours as needed for nausea or vomiting.   ONETOUCH ULTRA test strip USE TO TEST YOUR BLOOD SUGAR ONCE A DAY   pantoprazole (PROTONIX) 40 MG tablet Take 1 tablet (40 mg total) by mouth daily.   zolpidem (AMBIEN) 5 MG tablet Take 1 tablet (5 mg total) by mouth at bedtime as needed for sleep.   [DISCONTINUED] famotidine (PEPCID) 20 MG tablet 1 tablet at bedtime as needed Orally Once a day   [DISCONTINUED] metroNIDAZOLE (METROCREAM) 0.75 % cream Apply dime-size amount to face twice daily and taper use to daily once improved.   [DISCONTINUED] potassium chloride SA (K-DUR,KLOR-CON) 20 MEQ tablet Take 1 tablet (20 mEq total) by mouth 2 (two) times daily.   [DISCONTINUED] zolpidem (AMBIEN) 5 MG tablet Take 1 tablet (5 mg total) by mouth at bedtime as needed for sleep.   No facility-administered encounter medications on file as of 07/26/2022.    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   chemo comp  08/2009   Vision abnormalities     Past Surgical History:  Procedure Laterality Date   ABDOMINAL HYSTERECTOMY     COLECTOMY      Family History  Problem Relation Age of Onset   Cancer Mother        cervical ca   Parkinson's disease Mother    Heart disease Father     Social History   Socioeconomic History   Marital status: Divorced    Spouse name: Not on file   Number of children: Not on file   Years of education: Not on file   Highest education level: Some college, no degree  Occupational History   Not on file  Tobacco Use   Smoking status: Never   Smokeless tobacco: Never  Substance and Sexual Activity   Alcohol use: No   Drug use: No   Sexual activity: Not on file  Other Topics Concern   Not on file  Social History Narrative   Not on file   Social Determinants of Health   Financial Resource Strain: Low Risk  (07/22/2022)   Overall Financial Resource Strain (CARDIA)    Difficulty of Paying Living Expenses: Not hard at all  Food Insecurity: No Food Insecurity (07/22/2022)   Hunger Vital Sign    Worried About Running Out of Food in the Last Year: Never true    Ran Out of Food  in the Last Year: Never true  Transportation Needs: No Transportation Needs (07/22/2022)   PRAPARE - Administrator, Civil Service (Medical): No    Lack of Transportation (Non-Medical): No  Physical Activity: Insufficiently Active (07/22/2022)   Exercise Vital Sign    Days of Exercise per Week: 3 days    Minutes of Exercise per Session: 10 min  Stress: No Stress Concern Present (07/22/2022)   [employer] of Occupational Health - Occupational Stress Questionnaire    Feeling of Stress : Not at all  Social Connections: Moderately Integrated (07/22/2022)   Social Connection and Isolation Panel [NHANES]    Frequency of Communication with Friends and Family: More than three times a week    Frequency of Social Gatherings with Friends and Family: More than three times a week     Attends Religious Services: More than 4 times per year    Active Member of [employer] or Organizations: Yes    Attends Banker Meetings: More than 4 times per year    Marital Status: Divorced  Intimate Partner Violence: Not At Risk (01/08/2022)   Humiliation, Afraid, Rape, and Kick questionnaire    Fear of Current or Ex-Partner: No    Emotionally Abused: No    Physically Abused: No    Sexually Abused: No    Review of Systems  All other systems reviewed and are negative.       Objective    BP 119/79   Pulse 72   Temp 98.1 F (36.7 C) (Oral)   Resp 16   Wt 181 lb 6.4 oz (82.3 kg)   SpO2 95%   BMI 29.28 kg/m   Physical Exam Vitals and nursing note reviewed.  Constitutional:      General: She is not in acute distress. Cardiovascular:     Rate and Rhythm: Normal rate and regular rhythm.  Pulmonary:     Effort: Pulmonary effort is normal.     Breath sounds: Normal breath sounds.  Abdominal:     Palpations: Abdomen is soft.     Tenderness: There is no abdominal tenderness.  Neurological:     General: No focal deficit present.     Mental Status: She is alert and oriented to person, place, and time.         Assessment & Plan:   1. Essential hypertension Appears stable. continue - Basic Metabolic Panel - Lipid Panel  2. Diet-controlled diabetes mellitus (HCC) A1c is stable and at goal. continue - Basic Metabolic Panel - Lipid Panel - Hemoglobin A1c  3. Hypercholesterolemia Monitoring labs ordered - Lipid Panel  4. Insomnia, unspecified type Ambien refilled.  - zolpidem (AMBIEN) 5 MG tablet; Take 1 tablet (5 mg total) by mouth at bedtime as needed for sleep.  Dispense: 45 tablet; Refill: 0  Return in about 6 months (around 01/26/2023) for follow up.   Hannah Raymond, MD

## 2022-07-30 NOTE — Telephone Encounter (Signed)
Zolpidem prescribed 07/26/2022 by Georganna Skeans, MD.

## 2022-08-05 ENCOUNTER — Ambulatory Visit: Payer: PPO | Admitting: Family Medicine

## 2022-08-16 ENCOUNTER — Other Ambulatory Visit: Payer: Self-pay

## 2022-08-17 ENCOUNTER — Encounter: Payer: Self-pay | Admitting: Family Medicine

## 2022-08-17 NOTE — Telephone Encounter (Signed)
Patient request, please advise.

## 2022-08-18 ENCOUNTER — Encounter: Payer: Self-pay | Admitting: Family Medicine

## 2022-08-18 NOTE — Telephone Encounter (Signed)
Patient Hannah Frazier # [id number] 10/18/2022 Date of Jury  Please print for patient to be mailed

## 2022-08-20 ENCOUNTER — Other Ambulatory Visit: Payer: Self-pay | Admitting: Family Medicine

## 2022-09-17 ENCOUNTER — Other Ambulatory Visit (HOSPITAL_COMMUNITY): Payer: Self-pay

## 2022-09-17 ENCOUNTER — Other Ambulatory Visit: Payer: Self-pay

## 2022-09-17 DIAGNOSIS — D485 Neoplasm of uncertain behavior of skin: Secondary | ICD-10-CM | POA: Diagnosis not present

## 2022-09-17 DIAGNOSIS — L820 Inflamed seborrheic keratosis: Secondary | ICD-10-CM | POA: Diagnosis not present

## 2022-09-17 DIAGNOSIS — L821 Other seborrheic keratosis: Secondary | ICD-10-CM | POA: Diagnosis not present

## 2022-09-17 DIAGNOSIS — L817 Pigmented purpuric dermatosis: Secondary | ICD-10-CM | POA: Diagnosis not present

## 2022-09-17 DIAGNOSIS — L720 Epidermal cyst: Secondary | ICD-10-CM | POA: Diagnosis not present

## 2022-10-02 ENCOUNTER — Other Ambulatory Visit: Payer: Self-pay | Admitting: Hematology and Oncology

## 2022-10-04 ENCOUNTER — Encounter: Payer: Self-pay | Admitting: Pharmacist

## 2022-10-04 ENCOUNTER — Other Ambulatory Visit: Payer: Self-pay

## 2022-10-04 ENCOUNTER — Other Ambulatory Visit (HOSPITAL_COMMUNITY): Payer: Self-pay

## 2022-10-04 MED ORDER — PANTOPRAZOLE SODIUM 40 MG PO TBEC
40.0000 mg | DELAYED_RELEASE_TABLET | Freq: Every day | ORAL | 1 refills | Status: DC
  Filled 2022-10-04: qty 90, 90d supply, fill #0
  Filled 2022-12-27: qty 90, 90d supply, fill #1

## 2022-10-08 DIAGNOSIS — M1711 Unilateral primary osteoarthritis, right knee: Secondary | ICD-10-CM | POA: Diagnosis not present

## 2022-10-08 DIAGNOSIS — M1712 Unilateral primary osteoarthritis, left knee: Secondary | ICD-10-CM | POA: Diagnosis not present

## 2022-10-08 DIAGNOSIS — M170 Bilateral primary osteoarthritis of knee: Secondary | ICD-10-CM | POA: Diagnosis not present

## 2022-10-15 ENCOUNTER — Ambulatory Visit
Admission: EM | Admit: 2022-10-15 | Discharge: 2022-10-15 | Disposition: A | Discharge status: Home / Self Care | Payer: PPO | Attending: Internal Medicine | Admitting: Internal Medicine

## 2022-10-15 DIAGNOSIS — J029 Acute pharyngitis, unspecified: Secondary | ICD-10-CM | POA: Diagnosis not present

## 2022-10-15 LAB — POCT RAPID STREP A (OFFICE): Rapid Strep A Screen: NEGATIVE

## 2022-10-15 MED ORDER — AMOXICILLIN 500 MG PO CAPS: 500.0000 mg | ORAL_CAPSULE | Freq: Two times a day (BID) | ORAL | 0 refills | Status: AC

## 2022-10-15 NOTE — ED Provider Notes (Signed)
[facility]    CSN: [id number] Arrival date & time: 10/15/22  1042      History   Chief Complaint Chief Complaint  Patient presents with   Sore Throat    HPI Hannah Frazier is a 72 y.o. female.   Patient presents with 2-week history of sore throat.  Reports very minimal coughing but states this only typically seems to occur given irritation in the throat.  Denies any upper respiratory symptoms or fever.  Denies any known sick contacts.  Has taken Tylenol for symptoms.   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   chemo comp 08/2009   Vision abnormalities     Patient Active Problem List   Diagnosis Date Noted   Abnormal CT scan 06/18/2022   Coronary artery disease involving native coronary artery of native heart without angina pectoris 06/18/2022   Overweight (BMI 25.0-29.9) 12/24/2020   Hypercholesterolemia 12/24/2020   Dysphagia 12/24/2020   Difficulty sleeping 12/24/2020   Chronic superficial gastritis without bleeding 12/24/2020   Abnormal findings on diagnostic imaging of other specified body structures 12/24/2020   Benign essential hypertension 12/24/2020   Gastritis and duodenitis 05/21/2020   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      Home Medications    Prior to Admission medications   Medication Sig Start Date End Date Taking? Authorizing Provider  amoxicillin (AMOXIL) 500 MG capsule Take 1 capsule (500 mg total) by mouth 2 (two) times daily for 10 days. 10/15/22 10/25/22 Yes Zaelyn Barbary, Gean Larose E, FNP  Evolocumab (REPATHA SURECLICK) 140 MG/ML SOAJ Inject  140 mg into the skin every 14 (fourteen) days. 06/22/22   Azalee Course, PA  fenofibrate (TRICOR) 48 MG tablet Take 1 tablet (48 mg total) by mouth daily. 06/04/22   Georganna Skeans, MD  hydrochlorothiazide (HYDRODIURIL) 25 MG tablet Take 1 tablet by mouth every morning. 09/03/21   Georganna Skeans, MD  LORazepam (ATIVAN) 1 MG tablet Take 0.5 mg by mouth at bedtime as needed (for sleep).  11/11/10   [provider]  ondansetron (ZOFRAN) 4 MG tablet Take 1 tablet (4 mg total) by mouth every 8 (eight) hours as needed for nausea or vomiting. 07/23/21   Georganna Skeans, MD  [facility] ULTRA test strip USE TO TEST YOUR BLOOD SUGAR ONCE A DAY 04/29/22   Georganna Skeans, MD  pantoprazole (PROTONIX) 40 MG tablet Take 1 tablet (40 mg total) by mouth daily. 10/04/22   Artis Delay, MD  zolpidem (AMBIEN) 5 MG tablet Take 1 tablet (5 mg total) by mouth at bedtime as needed for sleep. 07/26/22   Georganna Skeans, MD    Family History Family History  Problem Relation Age of Onset   Cancer Mother        cervical ca   Parkinson's disease Mother    Heart disease Father     Social History Social History   Tobacco Use   Smoking status: Never   Smokeless tobacco: Never  Substance Use Topics   Alcohol use: No   Drug use:  No     Allergies   Simvastatin, Lisinopril, and Sertraline hcl   Review of Systems Review of Systems Per HPI  Physical Exam Triage Vital Signs ED Triage Vitals  Encounter Vitals Group     BP 10/15/22 1109 (!) 149/99     Systolic BP Percentile --      Diastolic BP Percentile --      Pulse Rate 10/15/22 1109 91     Resp 10/15/22 1109 16     Temp 10/15/22 1109 98.9 F (37.2 C)     Temp Source 10/15/22 1109 Oral     SpO2 10/15/22 1109 96 %     Weight 10/15/22 1109 177 lb (80.3 kg)     Height 10/15/22 1109 5\' 6"  (1.676 m)     Head Circumference --      Peak Flow --      Pain Score 10/15/22 1108 1     Pain Loc --      Pain Education --      Exclude from Growth Chart --    No data  found.  Updated Vital Signs BP (!) 149/99 (BP Location: Left Arm)   Pulse 91   Temp 98.9 F (37.2 C) (Oral)   Resp 16   Ht 5\' 6"  (1.676 m)   Wt 177 lb (80.3 kg)   SpO2 96%   BMI 28.57 kg/m   Visual Acuity Right Eye Distance:   Left Eye Distance:   Bilateral Distance:    Right Eye Near:   Left Eye Near:    Bilateral Near:     Physical Exam Constitutional:      General: She is not in acute distress.    Appearance: Normal appearance. She is not toxic-appearing or diaphoretic.  HENT:     Head: Normocephalic and atraumatic.     Right Ear: Tympanic membrane and ear canal normal.     Left Ear: Tympanic membrane and ear canal normal.     Nose: No congestion.     Mouth/Throat:     Mouth: Mucous membranes are moist.     Pharynx: Posterior oropharyngeal erythema present. No oropharyngeal exudate.     Tonsils: Tonsillar exudate present. No tonsillar abscesses. 1+ on the right. 1+ on the left.     Comments: Tongue appears normal.  Eyes:     Extraocular Movements: Extraocular movements intact.     Conjunctiva/sclera: Conjunctivae normal.     Pupils: Pupils are equal, round, and reactive to light.  Cardiovascular:     Rate and Rhythm: Normal rate and regular rhythm.     Pulses: Normal pulses.     Heart sounds: Normal heart sounds.  Pulmonary:     Effort: Pulmonary effort is normal. No respiratory distress.     Breath sounds: Normal breath sounds. No wheezing.  Abdominal:     General: Abdomen is flat. Bowel sounds are normal.     Palpations: Abdomen is soft.  Musculoskeletal:        General: Normal range of motion.     Cervical back: Normal range of motion.  Skin:    General: Skin is warm and dry.  Neurological:     General: No focal deficit present.     Mental Status: She is alert and oriented to person, place, and time. Mental status is at baseline.  Psychiatric:        Mood and Affect: Mood normal.        Behavior: Behavior normal.      UC  Treatments / Results   Labs (all labs ordered are listed, but only abnormal results are displayed) Labs Reviewed  POCT RAPID STREP A (OFFICE)    EKG   Radiology No results found.  Procedures Procedures (including critical care time)  Medications Ordered in UC Medications - No data to display  Initial Impression / Assessment and Plan / UC Course  I have reviewed the triage vital signs and the nursing notes.  Pertinent labs & imaging results that were available during my care of the patient were reviewed by me and considered in my medical decision making (see chart for details).     Strep completed that was negative.  Throat culture pending.  Given exudate noted on physical exam to posterior pharynx and tonsils, will treat with amoxicillin to treat bacteria in the throat.  No signs of thrush or other abnormalities on exam.  Other differentials could be viral pharyngitis versus GERD.  Advised strict follow-up with PCP or urgent care if symptoms persist or worsen.  Patient verbalized understanding and was agreeable with plan. Final Clinical Impressions(s) / UC Diagnoses   Final diagnoses:  Acute pharyngitis, unspecified etiology     Discharge Instructions      Step was negative but given how your throat looks on exam, I have sent you an antibiotic to treat infection.  If symptoms persist or worsen, please follow-up with urgent care or primary care doctor.     ED Prescriptions     Medication Sig Dispense Auth. Provider   amoxicillin (AMOXIL) 500 MG capsule Take 1 capsule (500 mg total) by mouth 2 (two) times daily for 10 days. 20 capsule Gustavus Bryant, [place]      PDMP not reviewed this encounter.   Gustavus Bryant, [place] 10/15/22 1148

## 2022-10-15 NOTE — ED Triage Notes (Signed)
Patient here today with c/o ST X 2 weeks. She has a headache for a few days and her right ear was bothering her for a few days. She is feeling better today but she looked at the back of her throat in the mirror and was concerned at the way it looks. She had taken a few Tylenol with no improvement. No sick contacts.

## 2022-10-15 NOTE — Discharge Instructions (Signed)
Step was negative but given how your throat looks on exam, I have sent you an antibiotic to treat infection.  If symptoms persist or worsen, please follow-up with urgent care or primary care doctor.

## 2022-10-18 LAB — CULTURE, GROUP A STREP (THRC)

## 2022-10-23 ENCOUNTER — Other Ambulatory Visit: Payer: Self-pay | Admitting: Family Medicine

## 2022-10-23 DIAGNOSIS — G4700 Insomnia, unspecified: Secondary | ICD-10-CM

## 2022-10-24 ENCOUNTER — Other Ambulatory Visit (HOSPITAL_COMMUNITY): Payer: Self-pay

## 2022-10-25 ENCOUNTER — Other Ambulatory Visit (HOSPITAL_COMMUNITY): Payer: Self-pay

## 2022-10-25 ENCOUNTER — Other Ambulatory Visit: Payer: PPO

## 2022-10-25 ENCOUNTER — Encounter: Payer: Self-pay | Admitting: Family Medicine

## 2022-10-25 ENCOUNTER — Other Ambulatory Visit: Payer: Self-pay

## 2022-10-25 NOTE — Telephone Encounter (Signed)
Requested medication (s) are due for refill today - yes  Requested medication (s) are on the active medication list -yes  Future visit scheduled -no  Last refill: 07/26/22 #45  Notes to clinic: non delegated Rx  Requested Prescriptions  Pending Prescriptions Disp Refills   zolpidem (AMBIEN) 5 MG tablet 45 tablet 0    Sig: Take 1 tablet (5 mg total) by mouth at bedtime as needed for sleep.     Not Delegated - Psychiatry:  Anxiolytics/Hypnotics Failed - 10/23/2022  9:35 PM      Failed - This refill cannot be delegated      Failed - Urine Drug Screen completed in last 360 days      Passed - Valid encounter within last 6 months    Recent Outpatient Visits           3 months ago Essential hypertension   [place] [facility], MD   8 months ago Diet-controlled diabetes mellitus [facility])   [place] [facility], MD   1 year ago Diet-controlled diabetes mellitus [facility])   [place] [facility], MD   1 year ago Insomnia, unspecified type   [place] [facility], MD   1 year ago Encounter for annual physical exam   [place] [facility], MD                 Requested Prescriptions  Pending Prescriptions Disp Refills   zolpidem (AMBIEN) 5 MG tablet 45 tablet 0    Sig: Take 1 tablet (5 mg total) by mouth at bedtime as needed for sleep.     Not Delegated - Psychiatry:  Anxiolytics/Hypnotics Failed - 10/23/2022  9:35 PM      Failed - This refill cannot be delegated      Failed - Urine Drug Screen completed in last 360 days      Passed - Valid encounter within last 6 months    Recent Outpatient Visits           3 months ago Essential hypertension   [place] [facility], MD   8 months ago Diet-controlled diabetes mellitus [facility])   [place] [facility], MD   1 year ago Diet-controlled diabetes mellitus [facility])   [place] [facility], MD   1 year ago Insomnia, unspecified type   [place] [facility] at [facility], MD   1 year ago Encounter for annual physical exam    [facility] at [facility], MD

## 2022-10-26 ENCOUNTER — Other Ambulatory Visit: Payer: Self-pay

## 2022-10-26 ENCOUNTER — Other Ambulatory Visit (HOSPITAL_COMMUNITY): Payer: Self-pay

## 2022-10-27 ENCOUNTER — Encounter: Payer: Self-pay | Admitting: Family Medicine

## 2022-10-27 ENCOUNTER — Other Ambulatory Visit: Payer: Self-pay

## 2022-10-27 ENCOUNTER — Other Ambulatory Visit (HOSPITAL_COMMUNITY): Payer: Self-pay

## 2022-10-27 ENCOUNTER — Telehealth: Payer: Self-pay | Admitting: *Deleted

## 2022-10-27 MED ORDER — MELOXICAM 15 MG PO TABS
15.0000 mg | ORAL_TABLET | Freq: Every day | ORAL | 0 refills | Status: AC
  Filled 2022-10-27: qty 30, 30d supply, fill #0

## 2022-10-27 NOTE — Telephone Encounter (Signed)
Med refill

## 2022-10-28 ENCOUNTER — Other Ambulatory Visit (HOSPITAL_COMMUNITY): Payer: Self-pay

## 2022-10-28 MED ORDER — METRONIDAZOLE 0.75 % EX CREA
TOPICAL_CREAM | CUTANEOUS | 2 refills | Status: DC
  Filled 2022-10-28: qty 45, 30d supply, fill #0

## 2022-10-29 ENCOUNTER — Ambulatory Visit: Payer: PPO

## 2022-10-29 ENCOUNTER — Other Ambulatory Visit: Payer: Self-pay | Admitting: Family Medicine

## 2022-10-29 ENCOUNTER — Telehealth: Payer: Self-pay | Admitting: *Deleted

## 2022-10-29 DIAGNOSIS — Z0000 Encounter for general adult medical examination without abnormal findings: Secondary | ICD-10-CM | POA: Diagnosis not present

## 2022-10-29 DIAGNOSIS — G4700 Insomnia, unspecified: Secondary | ICD-10-CM

## 2022-10-29 NOTE — Telephone Encounter (Signed)
Requested medication (s) are due for refill today: yes  Requested medication (s) are on the active medication list: yes  Last refill:  07/26/22 #45/0  Future visit scheduled: no  Notes to clinic:  Unable to refill per protocol, cannot delegate.    Requested Prescriptions  Pending Prescriptions Disp Refills   zolpidem (AMBIEN) 5 MG tablet 45 tablet 0    Sig: Take 1 tablet (5 mg total) by mouth at bedtime as needed for sleep.     Not Delegated - Psychiatry:  Anxiolytics/Hypnotics Failed - 10/29/2022  3:41 PM      Failed - This refill cannot be delegated      Failed - Urine Drug Screen completed in last 360 days      Passed - Valid encounter within last 6 months    Recent Outpatient Visits           3 months ago Essential hypertension   [place] [facility], MD   8 months ago Diet-controlled diabetes mellitus [facility])   [place] [facility], MD   1 year ago Diet-controlled diabetes mellitus [facility])   [place] [facility], MD   1 year ago Insomnia, unspecified type   [place] [facility] at [facility], MD   1 year ago Encounter for annual physical exam    Primary Care at [facility], MD

## 2022-10-29 NOTE — Patient Instructions (Signed)
Ms. Avena , Thank you for taking time to come for your Medicare Wellness Visit. I appreciate your ongoing commitment to your health goals. Please review the following plan we discussed and let me know if I can assist you in the future.   Referrals/Orders/Follow-Ups/Clinician Recommendations: none  This is a list of the screening recommended for you and due dates:  Health Maintenance  Topic Date Due   Complete foot exam   Never done   Eye exam for diabetics  11/28/2021   Yearly kidney health urinalysis for diabetes  01/07/2022   Flu Shot  08/26/2022   DEXA scan (bone density measurement)  01/09/2023*   Hemoglobin A1C  01/26/2023   Colon Cancer Screening  07/25/2023   Yearly kidney function blood test for diabetes  07/26/2023   Medicare Annual Wellness Visit  10/29/2023   Mammogram  04/05/2024   Pneumonia Vaccine  Completed   Hepatitis C Screening  Completed   HPV Vaccine  Aged Out   DTaP/Tdap/Td vaccine  Discontinued   COVID-19 Vaccine  Discontinued   Zoster (Shingles) Vaccine  Discontinued  *Topic was postponed. The date shown is not the original due date.    Advanced directives: (Copy Requested) Please bring a copy of your health care power of attorney and living will to the office to be added to your chart at your convenience.  Next Medicare Annual Wellness Visit scheduled for next year: No, schedule not open for next year  Insert Preventive Care attachment Insert FALL PREVENTION attachment if needed

## 2022-10-29 NOTE — Progress Notes (Signed)
Subjective:   Hannah Frazier is a 72 y.o. female who presents for Medicare Annual (Subsequent) preventive examination.  Visit Complete: Virtual  I connected with  Lottie Dawson on 10/29/22 by a audio enabled telemedicine application and verified that I am speaking with the correct person using two identifiers.  Patient Location: Home  Provider Location: Office/Clinic  I discussed the limitations of evaluation and management by telemedicine. The patient expressed understanding and agreed to proceed.  Patient Medicare AWV questionnaire was completed by the patient on 10/25/2022; I have confirmed that all information answered by patient is correct and no changes since this date.  Because this visit was a virtual/telehealth visit, some criteria may be missing or patient reported. Any vitals not documented were not able to be obtained and vitals that have been documented are patient reported.   Cardiac Risk Factors include: advanced age (>60men, >67 women);hypertension     Objective:    Today's Vitals   There is no height or weight on file to calculate BMI.     10/29/2022    3:31 PM 01/08/2022    2:45 PM 04/28/2015   11:55 AM 04/17/2014    9:20 AM 04/17/2014    9:00 AM  Advanced Directives  Does Patient Have a Medical Advance Directive? Yes Yes Yes Yes No  Type of Estate agent of [place];Living will Healthcare Power of [place];Living will Healthcare Power of [place];Living will Healthcare Power of [place];Living will   Does patient want to make changes to medical advance directive?   No - Patient declined No - Patient declined   Copy of Healthcare Power of Attorney in Chart? No - copy requested No - copy requested Yes Yes   Would patient like information on creating a medical advance directive?    No - patient declined information No - patient declined information    Current Medications (verified) Outpatient Encounter Medications as of  10/29/2022  Medication Sig   fenofibrate (TRICOR) 48 MG tablet Take 1 tablet (48 mg total) by mouth daily.   hydrochlorothiazide (HYDRODIURIL) 25 MG tablet Take 1 tablet by mouth every morning.   LORazepam (ATIVAN) 1 MG tablet Take 0.5 mg by mouth at bedtime as needed (for sleep).    meloxicam (MOBIC) 15 MG tablet Take 1 tablet by mouth once a day with food   metroNIDAZOLE (METROCREAM) 0.75 % cream Apply dime-size amount to face twice daily and taper use to daily once improved.   ondansetron (ZOFRAN) 4 MG tablet Take 1 tablet (4 mg total) by mouth every 8 (eight) hours as needed for nausea or vomiting.   ONETOUCH ULTRA test strip USE TO TEST YOUR BLOOD SUGAR ONCE A DAY   pantoprazole (PROTONIX) 40 MG tablet Take 1 tablet (40 mg total) by mouth daily.   zolpidem (AMBIEN) 5 MG tablet Take 1 tablet (5 mg total) by mouth at bedtime as needed for sleep.   Evolocumab (REPATHA SURECLICK) 140 MG/ML SOAJ Inject 140 mg into the skin every 14 (fourteen) days.   No facility-administered encounter medications on file as of 10/29/2022.    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 presenting with abdominal pain April, 2011   Hypertension    nhl dx'd 04/2009   chemo comp 08/2009   Vision abnormalities    Past Surgical History:  Procedure Laterality Date   ABDOMINAL HYSTERECTOMY     APPENDECTOMY  1989   COLECTOMY     Family History  Problem Relation Age of Onset   Cancer Mother        cervical ca   Parkinson's disease Mother    Heart disease Father    Diabetes Father    Hypertension Father    ADD / ADHD Son    ADD / ADHD Daughter    Anxiety disorder Son    Anxiety  disorder Daughter    Stroke Brother    Social History   Socioeconomic History   Marital status: Divorced    Spouse name: Not on file   Number of children: Not on file   Years of education: Not on file   Highest education level: Some college, no degree  Occupational History   Not on file  Tobacco Use   Smoking status: Never   Smokeless tobacco: Never  Vaping Use   Vaping status: Never Used  Substance and Sexual Activity   Alcohol use: No   Drug use: No   Sexual activity: Not Currently  Other Topics Concern   Not on file  Social History Narrative   Not on file   Social Determinants of Health   Financial Resource Strain: Low Risk  (10/25/2022)   Overall Financial Resource Strain (CARDIA)    Difficulty of Paying Living Expenses: Not hard at all  Food Insecurity: No Food Insecurity (10/25/2022)   Hunger Vital Sign    Worried About Running Out of Food in the Last Year: Never true    Ran Out of Food in the Last Year: Never true  Transportation Needs: No Transportation Needs (10/25/2022)   PRAPARE - Administrator, Civil Service (Medical): No    Lack of Transportation (Non-Medical): No  Physical Activity: Inactive (10/25/2022)   Exercise Vital Sign    Days of Exercise per Week: 0 days    Minutes of Exercise per Session: 0 min  Stress: No Stress Concern Present (10/25/2022)   [employer] of Occupational Health - Occupational Stress Questionnaire    Feeling of Stress : Not at all  Social Connections: Moderately Integrated (10/25/2022)   Social Connection and Isolation Panel [NHANES]    Frequency of Communication with Friends and Family: More than three times a week    Frequency of Social Gatherings with Friends and Family: More than three times a week    Attends Religious Services: More than 4 times per year    Active Member of [employer] or Organizations: Yes    Attends Engineer, structural: More than 4 times per year    Marital Status: Divorced     Tobacco Counseling Counseling given: Not Answered   Clinical Intake:  Pre-visit preparation completed: Yes  Pain : No/denies pain     Nutritional Risks: Nausea/ vomitting/ diarrhea (chronic diarrhea) Diabetes: No  How often do you need to have someone help you when you read instructions, pamphlets, or other written materials from your doctor or pharmacy?: 1 - Never  Interpreter Needed?: No  Information entered by :: NAllen LPN   Activities of Daily Living    10/25/2022    1:28 PM 01/08/2022    2:42 PM  In your present state of health,  do you have any difficulty performing the following activities:  Hearing? 0 0  Vision? 0 0  Difficulty concentrating or making decisions? 0 0  Walking or climbing stairs? 0 0  Dressing or bathing? 0 0  Doing errands, shopping? 0 0  Preparing Food and eating ? N N  Using the Toilet? N N  In the past six months, have you accidently leaked urine? Y N  Comment with a hard sneeze   Do you have problems with loss of bowel control? Y N  Comment due to diarrhea   Managing your Medications? N N  Managing your Finances? N N  Housekeeping or managing your Housekeeping? N N    Patient Care Team: Georganna Skeans, MD as PCP - General (Family Medicine) O'Neal, Ronnald Ramp, MD as PCP - Cardiology (Cardiology) Willis Modena, MD as Consulting Physician (Gastroenterology)  Indicate any recent Medical Services you may have received from other than [facility] providers in the past year (date may be approximate).     Assessment:   This is a routine wellness examination for Hannah Frazier.  Hearing/Vision screen Hearing Screening - Comments:: Denies hearing issues Vision Screening - Comments:: Regular eye exams, [facility]   Goals Addressed             This Visit's Progress    Patient Stated       10/29/2022, lose weight and keep sugar good       Depression Screen    10/29/2022    3:31 PM 07/26/2022    2:42 PM 02/04/2022   10:46 AM  01/08/2022    2:40 PM 07/23/2021    2:58 PM 01/07/2021    8:18 AM  PHQ 2/9 Scores  PHQ - 2 Score 0 0 0 0 0 0  PHQ- 9 Score 3 0 0  2 2    Fall Risk    10/25/2022    1:28 PM 01/08/2022    2:43 PM 01/03/2021   10:39 PM 10/15/2013   12:38 PM  Fall Risk   Falls in the past year? 0 0 0 No  Number falls in past yr: 0 0    Injury with Fall? 0 0    Risk for fall due to : Medication side effect No Fall Risks    Follow up Falls prevention discussed;Falls evaluation completed Falls prevention discussed      MEDICARE RISK AT HOME: Medicare Risk at Home Any stairs in or around the home?: Yes If so, are there any without handrails?: No Home free of loose throw rugs in walkways, pet beds, electrical cords, etc?: No Adequate lighting in your home to reduce risk of falls?: Yes Life alert?: No Use of a cane, walker or w/c?: No Grab bars in the bathroom?: No Shower chair or bench in shower?: No Elevated toilet seat or a handicapped toilet?: No  TIMED UP AND GO:  Was the test performed?  No    Cognitive Function:    01/07/2021    8:28 AM  MMSE - Mini Mental State Exam  Orientation to time 5  Orientation to Place 5  Registration 3  Attention/ Calculation 5  Recall 3  Language- name 2 objects 2  Language- repeat 1  Language- follow 3 step command 3  Language- read & follow direction 1  Write a sentence 1  Copy design 1  Total score 30        10/29/2022    3:32 PM 01/08/2022    2:45 PM  6CIT Screen  What Year? 0 points 0 points  What month? 0 points 0 points  What time? 0 points 0 points  Count back from 20 0 points 0 points  Months in reverse 0 points 0 points  Repeat phrase 0 points 0 points  Total Score 0 points 0 points    Immunizations Immunization History  Administered Date(s) Administered   Influenza Split 10/16/2009, 11/24/2011, 11/22/2015, 11/02/2018, 10/29/2019, 11/10/2020   Influenza, High Dose Seasonal PF 11/27/2016, 11/08/2017   Moderna Sars-Covid-2  Vaccination 03/06/2019   PFIZER(Purple Top)SARS-COV-2 Vaccination 03/06/2019, 03/26/2019, 11/26/2019   Pneumococcal Conjugate-13 08/22/2013   Pneumococcal Polysaccharide-23 09/14/2011, 11/17/2018   Tdap 08/28/2010    TDAP status: Up to date  Flu Vaccine status: Due, Education has been provided regarding the importance of this vaccine. Advised may receive this vaccine at local pharmacy or Health Dept. Aware to provide a copy of the vaccination record if obtained from local pharmacy or Health Dept. Verbalized acceptance and understanding.  Pneumococcal vaccine status: Up to date  Covid-19 vaccine status: Information provided on how to obtain vaccines.   Qualifies for Shingles Vaccine? Yes   Zostavax completed No   Shingrix Completed?: No.    Education has been provided regarding the importance of this vaccine. Patient has been advised to call insurance company to determine out of pocket expense if they have not yet received this vaccine. Advised may also receive vaccine at local pharmacy or Health Dept. Verbalized acceptance and understanding.  Screening Tests Health Maintenance  Topic Date Due   FOOT EXAM  Never done   OPHTHALMOLOGY EXAM  11/28/2021   Diabetic kidney evaluation - Urine ACR  01/07/2022   INFLUENZA VACCINE  08/26/2022   DEXA SCAN  01/09/2023 (Originally 08/31/2016)   HEMOGLOBIN A1C  01/26/2023   Colonoscopy  07/25/2023   Diabetic kidney evaluation - eGFR measurement  07/26/2023   Medicare Annual Wellness (AWV)  10/29/2023   MAMMOGRAM  04/05/2024   Pneumonia Vaccine 27+ Years old  Completed   Hepatitis C Screening  Completed   HPV VACCINES  Aged Out   DTaP/Tdap/Td  Discontinued   COVID-19 Vaccine  Discontinued   Zoster Vaccines- Shingrix  Discontinued    Health Maintenance  Health Maintenance Due  Topic Date Due   FOOT EXAM  Never done   OPHTHALMOLOGY EXAM  11/28/2021   Diabetic kidney evaluation - Urine ACR  01/07/2022   INFLUENZA VACCINE  08/26/2022     Colorectal cancer screening: Type of screening: Colonoscopy. Completed 07/24/2013. Repeat every 10 years  Mammogram status: Completed 04/06/2022. Repeat every year  Bone Density status: declines  Lung Cancer Screening: (Low Dose CT Chest recommended if Age 64-80 years, 20 pack-year currently smoking OR have quit w/in 15years.) does not qualify.   Lung Cancer Screening Referral: no  Additional Screening:  Hepatitis C Screening: does qualify; Completed 05/07/2009  Vision Screening: Recommended annual ophthalmology exams for early detection of glaucoma and other disorders of the eye. Is the patient up to date with their annual eye exam?  Yes  Who is the provider or what is the name of the office in which the patient attends annual eye exams? [facility] If pt is not established with a provider, would they like to be referred to a provider to establish care? No .   Dental Screening: Recommended annual dental exams for proper oral hygiene  Diabetic Foot Exam: n/a  Community Resource Referral / Chronic Care Management: CRR required this visit?  No   CCM required this visit?  No     Plan:     I have personally reviewed and noted the following in the patient's chart:   Medical and social history Use of alcohol, tobacco or illicit drugs  Current medications and supplements including opioid prescriptions. Patient is not currently taking opioid prescriptions. Functional ability and status Nutritional status Physical activity Advanced directives List of other physicians Hospitalizations, surgeries, and ER visits in previous 12 months Vitals Screenings to include cognitive, depression, and falls Referrals and appointments  In addition, I have reviewed and discussed with patient certain preventive protocols, quality metrics, and best practice recommendations. A written personalized care plan for preventive services as well as general preventive health recommendations were  provided to patient.     Barb Merino, LPN   29/05/6211   After Visit Summary: (MyChart) Due to this being a telephonic visit, the after visit summary with patients personalized plan was offered to patient via MyChart   Nurse Notes: none

## 2022-10-29 NOTE — Telephone Encounter (Signed)
Medication Refill - Medication: Rx #: [id number]  zolpidem (AMBIEN) 5 MG tablet [[id number]]   Pt reports that she has no medication left. Reporting that she has been requesting medication since 10/27/2022  Has the patient contacted their pharmacy? Yes.   (Agent: If no, request that the patient contact the pharmacy for the refill. If patient does not wish to contact the pharmacy document the reason why and proceed with request.) (Agent: If yes, when and what did the pharmacy advise?)  Preferred Pharmacy (with phone number or street name):  [place] [facility] Phone: [phone]  Fax: [phone]     Has the patient been seen for an appointment in the last year OR does the patient have an upcoming appointment? Yes.    Agent: Please be advised that RX refills may take up to 3 business days. We ask that you follow-up with your pharmacy.

## 2022-10-29 NOTE — Telephone Encounter (Signed)
olpidem (AMBIEN) 5 MG table

## 2022-10-29 NOTE — Telephone Encounter (Signed)
Med refill

## 2022-10-30 ENCOUNTER — Other Ambulatory Visit: Payer: Self-pay | Admitting: Family Medicine

## 2022-10-30 DIAGNOSIS — G4700 Insomnia, unspecified: Secondary | ICD-10-CM

## 2022-11-01 ENCOUNTER — Telehealth: Payer: Self-pay | Admitting: *Deleted

## 2022-11-01 ENCOUNTER — Other Ambulatory Visit (HOSPITAL_COMMUNITY): Payer: Self-pay

## 2022-11-01 NOTE — Telephone Encounter (Signed)
Pt following up on refill request for her  zolpidem (AMBIEN) 5 MG tablet.  Pt requested this on 10/02.  [place] [facility]    *Pt would like Dr Andrey Campanile to call her.

## 2022-11-02 ENCOUNTER — Other Ambulatory Visit: Payer: Self-pay | Admitting: Family Medicine

## 2022-11-02 ENCOUNTER — Other Ambulatory Visit (HOSPITAL_COMMUNITY): Payer: Self-pay

## 2022-11-02 ENCOUNTER — Other Ambulatory Visit: Payer: Self-pay

## 2022-11-02 DIAGNOSIS — G4700 Insomnia, unspecified: Secondary | ICD-10-CM

## 2022-11-02 MED ORDER — ZOLPIDEM TARTRATE 5 MG PO TABS
5.0000 mg | ORAL_TABLET | Freq: Every evening | ORAL | 0 refills | Status: DC | PRN
  Filled 2022-11-02: qty 45, 45d supply, fill #0

## 2022-11-02 NOTE — Telephone Encounter (Signed)
Requested medication (s) are due for refill today:   Provider to review  Requested medication (s) are on the active medication list:   Yes  Future visit scheduled:   No    Last ordered: Non delegated duplicate refill request.   See pharmacy message.   Requested Prescriptions  Pending Prescriptions Disp Refills   zolpidem (AMBIEN) 5 MG tablet 45 tablet 0    Sig: Take 1 tablet (5 mg total) by mouth at bedtime as needed for sleep.     Not Delegated - Psychiatry:  Anxiolytics/Hypnotics Failed - 10/30/2022  2:38 PM      Failed - This refill cannot be delegated      Failed - Urine Drug Screen completed in last 360 days      Passed - Valid encounter within last 6 months    Recent Outpatient Visits           3 months ago Essential hypertension   [place] [facility], MD   9 months ago Diet-controlled diabetes mellitus [facility])   [place] [facility], MD   1 year ago Diet-controlled diabetes mellitus [facility])   [place] [facility], MD   1 year ago Insomnia, unspecified type   [place] [facility], MD   1 year ago Encounter for annual physical exam   [place] [facility], MD       Future Appointments             In 1 month Jairo Ben, Simmie Davies, [place] [facility]

## 2022-11-17 ENCOUNTER — Ambulatory Visit
Admission: RE | Admit: 2022-11-17 | Discharge: 2022-11-17 | Disposition: A | Discharge status: Home / Self Care | Payer: PPO | Source: Ambulatory Visit | Attending: Family Medicine | Admitting: Family Medicine

## 2022-11-17 DIAGNOSIS — N6322 Unspecified lump in the left breast, upper inner quadrant: Secondary | ICD-10-CM | POA: Diagnosis not present

## 2022-11-17 DIAGNOSIS — N6320 Unspecified lump in the left breast, unspecified quadrant: Secondary | ICD-10-CM

## 2022-11-18 ENCOUNTER — Other Ambulatory Visit: Payer: Self-pay | Admitting: Family Medicine

## 2022-11-18 DIAGNOSIS — N6320 Unspecified lump in the left breast, unspecified quadrant: Secondary | ICD-10-CM

## 2022-12-09 ENCOUNTER — Ambulatory Visit: Payer: PPO | Admitting: Family Medicine

## 2022-12-27 ENCOUNTER — Other Ambulatory Visit: Payer: Self-pay | Admitting: Family Medicine

## 2022-12-27 ENCOUNTER — Encounter: Payer: Self-pay | Admitting: Family Medicine

## 2022-12-27 ENCOUNTER — Other Ambulatory Visit: Payer: Self-pay

## 2022-12-27 MED ORDER — HYDROCHLOROTHIAZIDE 25 MG PO TABS
25.0000 mg | ORAL_TABLET | Freq: Every morning | ORAL | 1 refills | Status: DC
  Filled 2022-12-27: qty 90, 90d supply, fill #0
  Filled 2023-03-27: qty 90, 90d supply, fill #1

## 2022-12-27 MED ORDER — FENOFIBRATE 48 MG PO TABS
48.0000 mg | ORAL_TABLET | Freq: Every day | ORAL | 1 refills | Status: DC
  Filled 2022-12-27: qty 90, 90d supply, fill #0
  Filled 2023-03-27: qty 90, 90d supply, fill #1

## 2022-12-27 NOTE — Telephone Encounter (Signed)
done

## 2022-12-28 ENCOUNTER — Other Ambulatory Visit: Payer: Self-pay

## 2023-01-10 ENCOUNTER — Encounter: Payer: Self-pay | Admitting: Family Medicine

## 2023-01-10 NOTE — Telephone Encounter (Signed)
Care team updated and letter sent for eye exam notes to [facility] (noted in [employer])

## 2023-01-25 ENCOUNTER — Other Ambulatory Visit (HOSPITAL_COMMUNITY): Payer: Self-pay

## 2023-01-25 ENCOUNTER — Encounter: Payer: Self-pay | Admitting: Family Medicine

## 2023-01-25 ENCOUNTER — Other Ambulatory Visit: Payer: Self-pay | Admitting: Family Medicine

## 2023-01-25 DIAGNOSIS — G4700 Insomnia, unspecified: Secondary | ICD-10-CM

## 2023-01-25 NOTE — Telephone Encounter (Signed)
 Medication Refill -  Most Recent Primary Care Visit:  Provider: ALLEN, NICKEAH E  Department: [facility]  Visit Type: MEDICARE AWV, SEQUENTIAL  Date: 10/29/2022  Medication: zolpidem  (AMBIEN ) 5 MG tablet [   Has the patient contacted their pharmacy? No (Agent: If no, request that the patient contact the pharmacy for the refill. If patient does not wish to contact the pharmacy document the reason why and proceed with request.) (Agent: If yes, when and what did the pharmacy advise?)  Is this the correct pharmacy for this prescription? Yes If no, delete pharmacy and type the correct one.  This is the patient's preferred pharmacy:  [facility] [address] [place] [address] Phone: [phone] Fax: [phone]    Is the patient out of the medication? Yes  Has the patient been seen for an appointment in the last year OR does the patient have an upcoming appointment? Yes  Can we respond through MyChart? Yes  Agent: Please be advised that Rx refills may take up to 3 business days. We ask that you follow-up with your pharmacy.

## 2023-01-27 ENCOUNTER — Telehealth: Payer: Self-pay | Admitting: *Deleted

## 2023-01-27 NOTE — Telephone Encounter (Signed)
 TE has been sent to the provider for refill

## 2023-01-27 NOTE — Telephone Encounter (Signed)
 TE sent to provider for refill request

## 2023-01-29 NOTE — Telephone Encounter (Signed)
 Requested medication (s) are due for refill today: Yes  Requested medication (s) are on the active medication list: Yes  Last refill:  11/02/22  Future visit scheduled: No  Notes to clinic:  Unable to refill per protocol, cannot delegate.      Requested Prescriptions  Pending Prescriptions Disp Refills   zolpidem  (AMBIEN ) 5 MG tablet 45 tablet 0    Sig: Take 1 tablet (5 mg total) by mouth at bedtime as needed for sleep.     Not Delegated - Psychiatry:  Anxiolytics/Hypnotics Failed - 01/29/2023 10:33 AM      Failed - This refill cannot be delegated      Failed - Urine Drug Screen completed in last 360 days      Passed - Valid encounter within last 6 months    Recent Outpatient Visits           6 months ago Essential hypertension   [place] [facility], MD   11 months ago Diet-controlled diabetes mellitus [facility])   [place] [facility], MD   1 year ago Diet-controlled diabetes mellitus [facility])   [place] [facility], MD   1 year ago Insomnia, unspecified type   [place] [facility], MD   2 years ago Encounter for annual physical exam   [place] [facility], MD

## 2023-02-02 ENCOUNTER — Other Ambulatory Visit: Payer: Self-pay | Admitting: Family Medicine

## 2023-02-02 DIAGNOSIS — G4700 Insomnia, unspecified: Secondary | ICD-10-CM

## 2023-02-03 ENCOUNTER — Other Ambulatory Visit: Payer: Self-pay

## 2023-02-03 ENCOUNTER — Encounter: Payer: Self-pay | Admitting: Family Medicine

## 2023-02-03 ENCOUNTER — Other Ambulatory Visit (HOSPITAL_COMMUNITY): Payer: Self-pay

## 2023-02-03 MED ORDER — ZOLPIDEM TARTRATE 5 MG PO TABS
5.0000 mg | ORAL_TABLET | Freq: Every evening | ORAL | 0 refills | Status: DC | PRN
  Filled 2023-02-03: qty 45, 45d supply, fill #0

## 2023-02-04 ENCOUNTER — Other Ambulatory Visit (HOSPITAL_COMMUNITY): Payer: Self-pay

## 2023-02-07 ENCOUNTER — Telehealth: Payer: Self-pay | Admitting: Family Medicine

## 2023-02-07 ENCOUNTER — Other Ambulatory Visit (HOSPITAL_COMMUNITY): Payer: Self-pay

## 2023-02-07 ENCOUNTER — Ambulatory Visit: Payer: PPO | Admitting: Family Medicine

## 2023-02-07 NOTE — Telephone Encounter (Signed)
 Pt is calling in because she needs a new prescription for LORazepam (ATIVAN) 1 MG tablet [[id number]] , and pt needs it sent to [facility]. Please follow up with pt.

## 2023-02-09 ENCOUNTER — Other Ambulatory Visit (HOSPITAL_COMMUNITY): Payer: Self-pay

## 2023-02-09 ENCOUNTER — Other Ambulatory Visit: Payer: Self-pay | Admitting: Family Medicine

## 2023-02-11 ENCOUNTER — Other Ambulatory Visit: Payer: Self-pay | Admitting: Family Medicine

## 2023-02-11 ENCOUNTER — Telehealth: Payer: Self-pay | Admitting: *Deleted

## 2023-02-11 ENCOUNTER — Encounter: Payer: Self-pay | Admitting: Family Medicine

## 2023-02-11 ENCOUNTER — Other Ambulatory Visit: Payer: Self-pay

## 2023-02-11 ENCOUNTER — Other Ambulatory Visit (HOSPITAL_COMMUNITY): Payer: Self-pay

## 2023-02-11 MED ORDER — LORAZEPAM 1 MG PO TABS
0.5000 mg | ORAL_TABLET | Freq: Every evening | ORAL | 0 refills | Status: DC | PRN
  Filled 2023-02-11: qty 6, 12d supply, fill #0

## 2023-02-11 NOTE — Telephone Encounter (Signed)
Medication refill of Lorazepam 1mg   [facility]

## 2023-02-11 NOTE — Telephone Encounter (Signed)
I have attempted without success to contact this patient by phone to return their call and I left a message on answering machine.

## 2023-02-16 ENCOUNTER — Encounter: Payer: PPO | Admitting: Family Medicine

## 2023-03-02 ENCOUNTER — Other Ambulatory Visit: Payer: Self-pay

## 2023-03-02 ENCOUNTER — Encounter: Payer: Self-pay | Admitting: Family Medicine

## 2023-03-02 ENCOUNTER — Ambulatory Visit: Payer: PPO | Admitting: Family Medicine

## 2023-03-02 ENCOUNTER — Other Ambulatory Visit (HOSPITAL_COMMUNITY): Payer: Self-pay

## 2023-03-02 VITALS — BP 130/92 | HR 91 | Temp 98.6°F | Resp 16 | Ht 66.0 in | Wt 177.0 lb

## 2023-03-02 DIAGNOSIS — Z130 Encounter for screening for diseases of the blood and blood-forming organs and certain disorders involving the immune mechanism: Secondary | ICD-10-CM

## 2023-03-02 DIAGNOSIS — Z0000 Encounter for general adult medical examination without abnormal findings: Secondary | ICD-10-CM

## 2023-03-02 DIAGNOSIS — Z0001 Encounter for general adult medical examination with abnormal findings: Secondary | ICD-10-CM

## 2023-03-02 DIAGNOSIS — Z13220 Encounter for screening for lipoid disorders: Secondary | ICD-10-CM | POA: Diagnosis not present

## 2023-03-02 DIAGNOSIS — E119 Type 2 diabetes mellitus without complications: Secondary | ICD-10-CM

## 2023-03-02 DIAGNOSIS — Z780 Asymptomatic menopausal state: Secondary | ICD-10-CM | POA: Diagnosis not present

## 2023-03-02 DIAGNOSIS — Z13820 Encounter for screening for osteoporosis: Secondary | ICD-10-CM | POA: Diagnosis not present

## 2023-03-02 LAB — POCT GLYCOSYLATED HEMOGLOBIN (HGB A1C): Hemoglobin A1C: 6.2 % — AB (ref 4.0–5.6)

## 2023-03-02 MED ORDER — LORAZEPAM 1 MG PO TABS
0.5000 mg | ORAL_TABLET | Freq: Every evening | ORAL | 0 refills | Status: DC | PRN
  Filled 2023-03-02: qty 30, 60d supply, fill #0

## 2023-03-02 NOTE — Progress Notes (Signed)
 Established Patient Office Visit  Subjective    Patient ID: Hannah Frazier, female    DOB: 1950-06-02  Age: 73 y.o. MRN: [id number]  CC:  Chief Complaint  Patient presents with   Annual Exam    Medication refill    HPI [name] presents for foutine annual exam. Patient denies acute complaints.   Outpatient Encounter Medications as of 03/02/2023  Medication Sig   fenofibrate  (TRICOR ) 48 MG tablet Take 1 tablet (48 mg total) by mouth daily.   hydrochlorothiazide  (HYDRODIURIL ) 25 MG tablet Take 1 tablet by mouth every morning.   meloxicam  (MOBIC ) 15 MG tablet Take 1 tablet by mouth once a day with food   metroNIDAZOLE  (METROCREAM ) 0.75 % cream Apply dime-size amount to face twice daily and taper use to daily once improved.   ondansetron  (ZOFRAN ) 4 MG tablet Take 1 tablet (4 mg total) by mouth every 8 (eight) hours as needed for nausea or vomiting.   ONETOUCH ULTRA test strip USE TO TEST YOUR BLOOD SUGAR ONCE A DAY   pantoprazole  (PROTONIX ) 40 MG tablet Take 1 tablet (40 mg total) by mouth daily.   [DISCONTINUED] LORazepam  (ATIVAN ) 1 MG tablet Take 0.5 tablets (0.5 mg total) by mouth at bedtime as needed (for sleep).   Evolocumab  (REPATHA  SURECLICK) 140 MG/ML SOAJ Inject 140 mg into the skin every 14 (fourteen) days.   LORazepam  (ATIVAN ) 1 MG tablet Take 0.5 tablets (0.5 mg total) by mouth at bedtime as needed for anxiety (for sleep).   zolpidem  (AMBIEN ) 5 MG tablet Take 1 tablet (5 mg total) by mouth at bedtime as needed for sleep. (Patient not taking: Reported on 03/02/2023)   No facility-administered encounter medications on file as of 03/02/2023.    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 presenting with abdominal  pain April, 2011   Hypertension    nhl dx'd 04/2009   chemo comp 08/2009   Vision abnormalities     Past Surgical History:  Procedure Laterality Date   ABDOMINAL HYSTERECTOMY     APPENDECTOMY  1989   COLECTOMY      Family History  Problem Relation Age of Onset   Cancer Mother        cervical ca   Parkinson's disease Mother    Heart disease Father    Diabetes Father    Hypertension Father    ADD / ADHD Son    ADD / ADHD Daughter    Anxiety disorder Son    Anxiety disorder Daughter    Stroke Brother     Social History   Socioeconomic History   Marital status: Divorced    Spouse name: Not on file   Number of children: Not on file   Years of education: Not on file   Highest education level: Some college, no degree  Occupational History   Not on file  Tobacco Use   Smoking status: Never   Smokeless tobacco: Never  Vaping Use   Vaping status: Never Used  Substance and Sexual Activity   Alcohol use: No   Drug use: No   Sexual activity: Not Currently  Other Topics Concern   Not  on file  Social History Narrative   Not on file   Social Drivers of Health   Financial Resource Strain: Low Risk  (02/26/2023)   Overall Financial Resource Strain (CARDIA)    Difficulty of Paying Living Expenses: Not hard at all  Food Insecurity: No Food Insecurity (02/26/2023)   Hunger Vital Sign    Worried About Running Out of Food in the Last Year: Never true    Ran Out of Food in the Last Year: Never true  Transportation Needs: No Transportation Needs (02/26/2023)   PRAPARE - Administrator, Civil Service (Medical): No    Lack of Transportation (Non-Medical): No  Physical Activity: Inactive (02/26/2023)   Exercise Vital Sign    Days of Exercise per Week: 2 days    Minutes of Exercise per Session: 0 min  Stress: No Stress Concern Present (02/26/2023)   [employer] of Occupational Health - Occupational Stress Questionnaire    Feeling of Stress : Only a little  Social  Connections: Moderately Integrated (02/26/2023)   Social Connection and Isolation Panel [NHANES]    Frequency of Communication with Friends and Family: More than three times a week    Frequency of Social Gatherings with Friends and Family: Patient declined    Attends Religious Services: More than 4 times per year    Active Member of [employer] or Organizations: Yes    Attends Engineer, Structural: More than 4 times per year    Marital Status: Divorced  Intimate Partner Violence: Not At Risk (10/29/2022)   Humiliation, Afraid, Rape, and Kick questionnaire    Fear of Current or Ex-Partner: No    Emotionally Abused: No    Physically Abused: No    Sexually Abused: No    Review of Systems  All other systems reviewed and are negative.       Objective    BP (!) 130/92 (BP Location: Right Arm, Patient Position: Sitting, Cuff Size: Normal)   Pulse 91   Temp 98.6 F (37 C) (Oral)   Resp 16   Ht 5' 6 (1.676 m)   Wt 177 lb (80.3 kg)   SpO2 98%   BMI 28.57 kg/m   Physical Exam Vitals and nursing note reviewed.  Constitutional:      General: She is not in acute distress. HENT:     Head: Normocephalic and atraumatic.     Right Ear: Tympanic membrane, ear canal and external ear normal.     Left Ear: Tympanic membrane, ear canal and external ear normal.     Nose: Nose normal.     Mouth/Throat:     Mouth: Mucous membranes are moist.     Pharynx: Oropharynx is clear.  Eyes:     Conjunctiva/sclera: Conjunctivae normal.     Pupils: Pupils are equal, round, and reactive to light.  Neck:     Thyroid : No thyromegaly.  Cardiovascular:     Rate and Rhythm: Normal rate and regular rhythm.     Heart sounds: Normal heart sounds. No murmur heard. Pulmonary:     Effort: Pulmonary effort is normal. No respiratory distress.     Breath sounds: Normal breath sounds.  Abdominal:     General: There is no distension.     Palpations: Abdomen is soft. There is no mass.     Tenderness: There  is no abdominal tenderness.  Musculoskeletal:        General: Normal range of motion.     Cervical back: Normal range  of motion and neck supple.  Skin:    General: Skin is warm and dry.  Neurological:     General: No focal deficit present.     Mental Status: She is alert and oriented to person, place, and time.  Psychiatric:        Mood and Affect: Mood normal.        Behavior: Behavior normal.         Assessment & Plan:   Annual physical exam -     CMP14+EGFR  Diet-controlled diabetes mellitus (HCC) -     POCT glycosylated hemoglobin (Hb A1C) -     Microalbumin / creatinine urine ratio  Encounter for osteoporosis screening in asymptomatic postmenopausal patient  Screening for deficiency anemia -     CBC with Differential/Platelet  Screening for lipid disorders -     Lipid panel  Other orders -     LORazepam ; Take 0.5 tablets (0.5 mg total) by mouth at bedtime as needed for anxiety (for sleep).  Dispense: 30 tablet; Refill: 0     No follow-ups on file.   Tanda Raguel SQUIBB, MD

## 2023-03-03 LAB — MICROALBUMIN / CREATININE URINE RATIO
Creatinine, Urine: 167.3 mg/dL
Microalb/Creat Ratio: 9 mg/g{creat} (ref 0–29)
Microalbumin, Urine: 14.6 ug/mL

## 2023-03-03 LAB — CBC WITH DIFFERENTIAL/PLATELET
Basophils Absolute: 0.1 10*3/uL (ref 0.0–0.2)
Basos: 1 %
EOS (ABSOLUTE): 0.2 10*3/uL (ref 0.0–0.4)
Eos: 2 %
Hematocrit: 49.9 % — ABNORMAL HIGH (ref 34.0–46.6)
Hemoglobin: 16.1 g/dL — ABNORMAL HIGH (ref 11.1–15.9)
Immature Grans (Abs): 0 10*3/uL (ref 0.0–0.1)
Immature Granulocytes: 1 %
Lymphocytes Absolute: 2.3 10*3/uL (ref 0.7–3.1)
Lymphs: 36 %
MCH: 28.8 pg (ref 26.6–33.0)
MCHC: 32.3 g/dL (ref 31.5–35.7)
MCV: 89 fL (ref 79–97)
Monocytes Absolute: 0.4 10*3/uL (ref 0.1–0.9)
Monocytes: 7 %
Neutrophils Absolute: 3.6 10*3/uL (ref 1.4–7.0)
Neutrophils: 53 %
Platelets: 361 10*3/uL (ref 150–450)
RBC: 5.59 x10E6/uL — ABNORMAL HIGH (ref 3.77–5.28)
RDW: 12.7 % (ref 11.7–15.4)
WBC: 6.6 10*3/uL (ref 3.4–10.8)

## 2023-03-03 LAB — LIPID PANEL
Chol/HDL Ratio: 6.1 {ratio} — ABNORMAL HIGH (ref 0.0–4.4)
Cholesterol, Total: 288 mg/dL — ABNORMAL HIGH (ref 100–199)
HDL: 47 mg/dL (ref >39)
LDL Chol Calc (NIH): 209 mg/dL — ABNORMAL HIGH (ref 0–99)
Triglycerides: 169 mg/dL — ABNORMAL HIGH (ref 0–149)
VLDL Cholesterol Cal: 32 mg/dL (ref 5–40)

## 2023-03-03 LAB — CMP14+EGFR
ALT: 19 [IU]/L (ref 0–32)
AST: 24 [IU]/L (ref 0–40)
Albumin: 4.7 g/dL (ref 3.8–4.8)
Alkaline Phosphatase: 95 [IU]/L (ref 44–121)
BUN/Creatinine Ratio: 10 — ABNORMAL LOW (ref 12–28)
BUN: 10 mg/dL (ref 8–27)
Bilirubin Total: 0.7 mg/dL (ref 0.0–1.2)
CO2: 23 mmol/L (ref 20–29)
Calcium: 9.9 mg/dL (ref 8.7–10.3)
Chloride: 94 mmol/L — ABNORMAL LOW (ref 96–106)
Creatinine, Ser: 1.01 mg/dL — ABNORMAL HIGH (ref 0.57–1.00)
Globulin, Total: 3.2 g/dL (ref 1.5–4.5)
Glucose: 145 mg/dL — ABNORMAL HIGH (ref 70–99)
Potassium: 3.9 mmol/L (ref 3.5–5.2)
Sodium: 138 mmol/L (ref 134–144)
Total Protein: 7.9 g/dL (ref 6.0–8.5)
eGFR: 60 mL/min/{1.73_m2} (ref >59)

## 2023-03-04 ENCOUNTER — Encounter: Payer: Self-pay | Admitting: Family Medicine

## 2023-03-04 DIAGNOSIS — Z130 Encounter for screening for diseases of the blood and blood-forming organs and certain disorders involving the immune mechanism: Secondary | ICD-10-CM

## 2023-03-11 ENCOUNTER — Other Ambulatory Visit: Payer: Self-pay | Admitting: *Deleted

## 2023-03-11 NOTE — Telephone Encounter (Signed)
I called and spoken with patient and made her aware that MD Andrey Campanile wants her to hydrate and repeat lab in 2 to 3 months.  Phone got disconnect so I called her back and did not get an answer.  I left her a message to return my call.

## 2023-03-11 NOTE — Telephone Encounter (Signed)
Patient called back and I made her aware that we put in a order for a CBC.  She said that she will get it done at the cancer center.

## 2023-03-27 ENCOUNTER — Other Ambulatory Visit: Payer: Self-pay | Admitting: Family Medicine

## 2023-03-27 ENCOUNTER — Other Ambulatory Visit: Payer: Self-pay | Admitting: Hematology and Oncology

## 2023-03-28 ENCOUNTER — Other Ambulatory Visit: Payer: Self-pay

## 2023-03-31 ENCOUNTER — Other Ambulatory Visit: Payer: Self-pay

## 2023-03-31 ENCOUNTER — Other Ambulatory Visit (HOSPITAL_COMMUNITY): Payer: Self-pay

## 2023-03-31 ENCOUNTER — Inpatient Hospital Stay: Payer: PPO

## 2023-03-31 ENCOUNTER — Inpatient Hospital Stay: Payer: PPO | Attending: Hematology and Oncology | Admitting: Hematology and Oncology

## 2023-03-31 ENCOUNTER — Encounter: Payer: Self-pay | Admitting: Hematology and Oncology

## 2023-03-31 ENCOUNTER — Inpatient Hospital Stay

## 2023-03-31 VITALS — BP 126/74 | HR 80 | Temp 97.5°F | Resp 18 | Ht 66.0 in | Wt 180.8 lb

## 2023-03-31 DIAGNOSIS — Z6825 Body mass index (BMI) 25.0-25.9, adult: Secondary | ICD-10-CM | POA: Diagnosis not present

## 2023-03-31 DIAGNOSIS — R109 Unspecified abdominal pain: Secondary | ICD-10-CM | POA: Diagnosis not present

## 2023-03-31 DIAGNOSIS — Z8572 Personal history of non-Hodgkin lymphomas: Secondary | ICD-10-CM

## 2023-03-31 DIAGNOSIS — Z79899 Other long term (current) drug therapy: Secondary | ICD-10-CM | POA: Diagnosis not present

## 2023-03-31 DIAGNOSIS — E663 Overweight: Secondary | ICD-10-CM | POA: Insufficient documentation

## 2023-03-31 LAB — CBC WITH DIFFERENTIAL/PLATELET
Abs Immature Granulocytes: 0.01 K/uL (ref 0.00–0.07)
Basophils Absolute: 0.1 K/uL (ref 0.0–0.1)
Basophils Relative: 1 %
Eosinophils Absolute: 0.2 K/uL (ref 0.0–0.5)
Eosinophils Relative: 3 %
HCT: 45 % (ref 36.0–46.0)
Hemoglobin: 15 g/dL (ref 12.0–15.0)
Immature Granulocytes: 0 %
Lymphocytes Relative: 43 %
Lymphs Abs: 2.9 K/uL (ref 0.7–4.0)
MCH: 29 pg (ref 26.0–34.0)
MCHC: 33.3 g/dL (ref 30.0–36.0)
MCV: 86.9 fL (ref 80.0–100.0)
Monocytes Absolute: 0.5 K/uL (ref 0.1–1.0)
Monocytes Relative: 8 %
Neutro Abs: 3 K/uL (ref 1.7–7.7)
Neutrophils Relative %: 45 %
Platelets: 310 K/uL (ref 150–400)
RBC: 5.18 MIL/uL — ABNORMAL HIGH (ref 3.87–5.11)
RDW: 12.6 % (ref 11.5–15.5)
WBC: 6.7 K/uL (ref 4.0–10.5)
nRBC: 0 % (ref 0.0–0.2)

## 2023-03-31 LAB — COMPREHENSIVE METABOLIC PANEL
ALT: 20 U/L (ref 0–44)
AST: 24 U/L (ref 15–41)
Albumin: 4.3 g/dL (ref 3.5–5.0)
Alkaline Phosphatase: 63 U/L (ref 38–126)
Anion gap: 6 (ref 5–15)
BUN: 11 mg/dL (ref 8–23)
CO2: 32 mmol/L (ref 22–32)
Calcium: 9.5 mg/dL (ref 8.9–10.3)
Chloride: 100 mmol/L (ref 98–111)
Creatinine, Ser: 1 mg/dL (ref 0.44–1.00)
GFR, Estimated: >60 mL/min (ref >60)
Glucose, Bld: 173 mg/dL — ABNORMAL HIGH (ref 70–99)
Potassium: 3.9 mmol/L (ref 3.5–5.1)
Sodium: 138 mmol/L (ref 135–145)
Total Bilirubin: 0.9 mg/dL (ref 0.0–1.2)
Total Protein: 7.8 g/dL (ref 6.5–8.1)

## 2023-03-31 MED ORDER — PANTOPRAZOLE SODIUM 40 MG PO TBEC
40.0000 mg | DELAYED_RELEASE_TABLET | Freq: Every day | ORAL | 1 refills | Status: DC
Start: 2023-03-31 — End: 2023-09-27
  Filled 2023-03-31: qty 90, 90d supply, fill #0
  Filled 2023-06-25: qty 90, 90d supply, fill #1

## 2023-03-31 NOTE — Progress Notes (Signed)
 [facility] OFFICE PROGRESS NOTE  Patient Care Team: Georganna Skeans, MD as PCP - General (Family Medicine) O'Neal, Ronnald Ramp, MD as PCP - Cardiology (Cardiology) Willis Modena, MD as Consulting Physician (Gastroenterology) [facility])  Assessment & Plan History of B-cell lymphoma She has stage I diagnosis of lymphoma, extranodal: mass in terminal ileum presenting with abdominal pain April, 2011 She did not require adjuvant treatment Her last imaging study show no signs of cancer recurrence She has no signs or symptoms to suggest cancer recurrence She does not need long-term surveillance imaging study I will see her again in 12 months for further follow-up Overweight (BMI 25.0-29.9) We discussed importance of risk factor modification in association with cancer recurrence She appears motivated for dietary changes and weight loss  No orders of the defined types were placed in this encounter.    Artis Delay, MD  INTERVAL HISTORY: she returns for surveillance follow-up She denies recent abdominal pain or changes in bowel habits No new lymphadenopathy I reviewed test results We discussed test results and future plan of care as outlined above  PHYSICAL EXAMINATION: ECOG PERFORMANCE STATUS: 0 - Asymptomatic  Vitals:   03/31/23 0837  BP: 126/74  Pulse: 80  Resp: 18  Temp: (!) 97.5 F (36.4 C)  SpO2: 99%   Filed Weights   03/31/23 0837  Weight: 180 lb 12.8 oz (82 kg)   GENERAL:alert, no distress and comfortable SKIN: skin color, texture, turgor are normal, no rashes or significant lesions EYES: normal, conjunctiva are pink and non-injected, sclera clear OROPHARYNX:no exudate, no erythema and lips, buccal mucosa, and tongue normal  NECK: supple, thyroid normal size, non-tender, without nodularity LYMPH:  no palpable lymphadenopathy in the cervical, axillary or inguinal LUNGS: clear to auscultation and percussion with normal breathing  effort HEART: regular rate & rhythm and no murmurs and no lower extremity edema ABDOMEN:abdomen soft, non-tender and normal bowel sounds Musculoskeletal:no cyanosis of digits and no clubbing  PSYCH: alert & oriented x 3 with fluent speech NEURO: no focal motor/sensory deficits  CBC and CMP were reviewed

## 2023-03-31 NOTE — Assessment & Plan Note (Addendum)
 She has stage I diagnosis of lymphoma, extranodal: mass in terminal ileum presenting with abdominal pain April, 2011 She did not require adjuvant treatment Her last imaging study show no signs of cancer recurrence She has no signs or symptoms to suggest cancer recurrence She does not need long-term surveillance imaging study I will see her again in 12 months for further follow-up

## 2023-03-31 NOTE — Assessment & Plan Note (Addendum)
We discussed importance of risk factor modification in association with cancer recurrence She appears motivated for dietary changes and weight loss

## 2023-04-01 ENCOUNTER — Other Ambulatory Visit (HOSPITAL_COMMUNITY): Payer: Self-pay

## 2023-04-01 MED ORDER — ONDANSETRON HCL 4 MG PO TABS
4.0000 mg | ORAL_TABLET | Freq: Three times a day (TID) | ORAL | 1 refills | Status: AC | PRN
  Filled 2023-04-01: qty 20, 7d supply, fill #0

## 2023-04-03 ENCOUNTER — Encounter: Payer: Self-pay | Admitting: Hematology and Oncology

## 2023-04-04 ENCOUNTER — Encounter: Payer: Self-pay | Admitting: Family Medicine

## 2023-04-06 ENCOUNTER — Other Ambulatory Visit (HOSPITAL_COMMUNITY): Payer: Self-pay

## 2023-04-08 ENCOUNTER — Other Ambulatory Visit: Payer: PPO

## 2023-04-12 ENCOUNTER — Other Ambulatory Visit (HOSPITAL_COMMUNITY): Payer: Self-pay

## 2023-05-02 ENCOUNTER — Encounter: Payer: Self-pay | Admitting: Family Medicine

## 2023-05-03 ENCOUNTER — Other Ambulatory Visit: Payer: Self-pay | Admitting: Family Medicine

## 2023-05-03 MED ORDER — ONETOUCH ULTRA 2 W/DEVICE KIT
PACK | 0 refills | Status: AC
  Filled 2023-05-03: qty 1, 1d supply, fill #0

## 2023-05-03 NOTE — Telephone Encounter (Signed)
 Sent to pharmacy

## 2023-05-04 ENCOUNTER — Other Ambulatory Visit (HOSPITAL_COMMUNITY): Payer: Self-pay

## 2023-05-04 ENCOUNTER — Other Ambulatory Visit: Payer: Self-pay

## 2023-06-25 ENCOUNTER — Other Ambulatory Visit: Payer: Self-pay | Admitting: Family Medicine

## 2023-06-25 ENCOUNTER — Other Ambulatory Visit (HOSPITAL_COMMUNITY): Payer: Self-pay

## 2023-06-25 ENCOUNTER — Encounter: Payer: Self-pay | Admitting: Family Medicine

## 2023-06-27 ENCOUNTER — Other Ambulatory Visit (HOSPITAL_COMMUNITY): Payer: Self-pay

## 2023-06-27 ENCOUNTER — Other Ambulatory Visit: Payer: Self-pay

## 2023-06-27 MED ORDER — HYDROCHLOROTHIAZIDE 25 MG PO TABS
25.0000 mg | ORAL_TABLET | Freq: Every morning | ORAL | 0 refills | Status: AC
  Filled 2023-06-27: qty 90, 90d supply, fill #0

## 2023-06-27 MED ORDER — FENOFIBRATE 48 MG PO TABS
48.0000 mg | ORAL_TABLET | Freq: Every day | ORAL | 0 refills | Status: DC
  Filled 2023-06-27: qty 90, 90d supply, fill #0

## 2023-06-27 NOTE — Telephone Encounter (Signed)
 Requested medication (s) are due for refill today: yes  Requested medication (s) are on the active medication list: yes  Last refill:  03/02/23  Future visit scheduled: no  Notes to clinic:  Unable to refill per protocol, cannot delegate.      Requested Prescriptions  Pending Prescriptions Disp Refills   LORazepam  (ATIVAN ) 1 MG tablet 30 tablet 0    Sig: Take 0.5 tablets (0.5 mg total) by mouth at bedtime as needed for anxiety (for sleep).     Not Delegated - Psychiatry: Anxiolytics/Hypnotics 2 Failed - 06/27/2023 12:20 PM      Failed - This refill cannot be delegated      Failed - Urine Drug Screen completed in last 360 days      Passed - Patient is not pregnant      Passed - Valid encounter within last 6 months    Recent Outpatient Visits           3 months ago Annual physical exam   [place] [facility], MD   11 months ago Essential hypertension   [place] [facility], MD   1 year ago Diet-controlled diabetes mellitus [facility])   [place] [facility], MD   1 year ago Diet-controlled diabetes mellitus [facility])   [place] [facility] at [facility], MD   2 years ago Insomnia, unspecified type   [place] [facility] Caffe[facility], MD              Signed Prescriptions Disp Refills   hydrochlorothiazide  (HYDRODIURIL ) 25 MG tablet 90 tablet 0    Sig: Take 1 tablet by mouth every morning.     Cardiovascular: Diuretics - Thiazide Passed - 06/27/2023 12:20 PM      Passed - Cr in normal range and within 180 days    Creatinine  Date Value Ref Range Status  04/24/2020 0.89 0.44 - 1.00 mg/dL Final  32/44/0102 0.9 0.6 - 1.1 mg/dL Final   Creatinine, Ser  Date Value Ref Range Status  03/31/2023 1.00 0.44 - 1.00 mg/dL Final         Passed - K in normal range and within 180 days    Potassium  Date Value Ref Range Status   03/31/2023 3.9 3.5 - 5.1 mmol/L Final  04/08/2016 3.7 3.5 - 5.1 mEq/L Final         Passed - Na in normal range and within 180 days    Sodium  Date Value Ref Range Status  03/31/2023 138 135 - 145 mmol/L Final  03/02/2023 138 134 - 144 mmol/L Final  04/08/2016 139 136 - 145 mEq/L Final         Passed - Last BP in normal range    BP Readings from Last 1 Encounters:  03/31/23 126/74         Passed - Valid encounter within last 6 months    Recent Outpatient Visits           3 months ago Annual physical exam   [place] [facility], MD   11 months ago Essential hypertension   [place] [facility], MD   1 year ago Diet-controlled diabetes mellitus [facility])   [place] [facility], MD   1 year ago Diet-controlled diabetes mellitus (HCC)  [place] [facility] at [facility], MD   2 years ago Insomnia, unspecified type   [place] [facility] Caffe[facility], MD               fenofibrate  (TRICOR ) 48 MG tablet 90 tablet 0    Sig: Take 1 tablet (48 mg total) by mouth daily.     Cardiovascular:  Antilipid - Fibric Acid Derivatives Failed - 06/27/2023 12:20 PM      Failed - Lipid Panel in normal range within the last 12 months    Cholesterol, Total  Date Value Ref Range Status  03/02/2023 288 (H) 100 - 199 mg/dL Final   LDL Chol Calc (NIH)  Date Value Ref Range Status  03/02/2023 209 (H) 0 - 99 mg/dL Final   HDL  Date Value Ref Range Status  03/02/2023 47 >39 mg/dL Final   Triglycerides  Date Value Ref Range Status  03/02/2023 169 (H) 0 - 149 mg/dL Final         Passed - ALT in normal range and within 360 days    ALT  Date Value Ref Range Status  03/31/2023 20 0 - 44 U/L Final  04/24/2020 20 0 - 44 U/L Final  04/08/2016 23 0 - 55 U/L Final         Passed - AST in normal range and within 360 days    AST  Date  Value Ref Range Status  03/31/2023 24 15 - 41 U/L Final  04/24/2020 23 15 - 41 U/L Final  04/08/2016 22 5 - 34 U/L Final         Passed - Cr in normal range and within 360 days    Creatinine  Date Value Ref Range Status  04/24/2020 0.89 0.44 - 1.00 mg/dL Final  16/10/9602 0.9 0.6 - 1.1 mg/dL Final   Creatinine, Ser  Date Value Ref Range Status  03/31/2023 1.00 0.44 - 1.00 mg/dL Final         Passed - HGB in normal range and within 360 days    Hemoglobin  Date Value Ref Range Status  03/31/2023 15.0 12.0 - 15.0 g/dL Final  54/09/8117 14.7 (H) 11.1 - 15.9 g/dL Final   HGB  Date Value Ref Range Status  04/08/2016 15.2 11.6 - 15.9 g/dL Final         Passed - HCT in normal range and within 360 days    HCT  Date Value Ref Range Status  03/31/2023 45.0 36.0 - 46.0 % Final  04/08/2016 44.6 34.8 - 46.6 % Final   Hematocrit  Date Value Ref Range Status  03/02/2023 49.9 (H) 34.0 - 46.6 % Final         Passed - PLT in normal range and within 360 days    Platelets  Date Value Ref Range Status  03/31/2023 310 150 - 400 K/uL Final  03/02/2023 361 150 - 450 x10E3/uL Final         Passed - WBC in normal range and within 360 days    WBC  Date Value Ref Range Status  03/31/2023 6.7 4.0 - 10.5 K/uL Final         Passed - eGFR is 30 or above and within 360 days    GFR, Est AFR Am  Date Value Ref Range Status  04/09/2019 >60 >60 mL/min Final   GFR, Estimated  Date Value Ref Range Status  03/31/2023 >60 >60 mL/min Final  Comment:    (NOTE) Calculated using the CKD-EPI Creatinine Equation (2021)   04/24/2020 >60 >60 mL/min Final    Comment:    (NOTE) Calculated using the CKD-EPI Creatinine Equation (2021)    eGFR  Date Value Ref Range Status  03/02/2023 60 >59 mL/min/1.73 Final         Passed - Valid encounter within last 12 months    Recent Outpatient Visits           3 months ago Annual physical exam   [place] [facility], MD   11 months ago Essential hypertension   [place] [facility], MD   1 year ago Diet-controlled diabetes mellitus [facility])   [place] [facility] at [facility], MD   1 year ago Diet-controlled diabetes mellitus [facility])   [place] [facility] at [facility], MD   2 years ago Insomnia, unspecified type   [facility], MD

## 2023-06-27 NOTE — Telephone Encounter (Signed)
 Good morning. Ms. Kluender, just want you to  be  aware that your provider is out the office until next week . The covering provider may not prescribe this medication for you.  PCE

## 2023-06-27 NOTE — Telephone Encounter (Signed)
 Requested Prescriptions  Pending Prescriptions Disp Refills   hydrochlorothiazide  (HYDRODIURIL ) 25 MG tablet 90 tablet 0    Sig: Take 1 tablet by mouth every morning.     Cardiovascular: Diuretics - Thiazide Passed - 06/27/2023 12:20 PM      Passed - Cr in normal range and within 180 days    Creatinine  Date Value Ref Range Status  04/24/2020 0.89 0.44 - 1.00 mg/dL Final  16/10/9602 0.9 0.6 - 1.1 mg/dL Final   Creatinine, Ser  Date Value Ref Range Status  03/31/2023 1.00 0.44 - 1.00 mg/dL Final         Passed - K in normal range and within 180 days    Potassium  Date Value Ref Range Status  03/31/2023 3.9 3.5 - 5.1 mmol/L Final  04/08/2016 3.7 3.5 - 5.1 mEq/L Final         Passed - Na in normal range and within 180 days    Sodium  Date Value Ref Range Status  03/31/2023 138 135 - 145 mmol/L Final  03/02/2023 138 134 - 144 mmol/L Final  04/08/2016 139 136 - 145 mEq/L Final         Passed - Last BP in normal range    BP Readings from Last 1 Encounters:  03/31/23 126/74         Passed - Valid encounter within last 6 months    Recent Outpatient Visits           3 months ago Annual physical exam   [place] [facility], MD   11 months ago Essential hypertension   [place] [facility], MD   1 year ago Diet-controlled diabetes mellitus [facility])   [place] [facility], MD   1 year ago Diet-controlled diabetes mellitus [facility])   [place] [facility] at [facility], MD   2 years ago Insomnia, unspecified type   [place] [facility] at [facility] Caffey, MD               fenofibrate  (TRICOR ) 48 MG tablet 90 tablet 0    Sig: Take 1 tablet (48 mg total) by mouth daily.     Cardiovascular:  Antilipid - Fibric Acid Derivatives Failed - 06/27/2023 12:20 PM      Failed - Lipid Panel in normal range within the last 12 months     Cholesterol, Total  Date Value Ref Range Status  03/02/2023 288 (H) 100 - 199 mg/dL Final   LDL Chol Calc (NIH)  Date Value Ref Range Status  03/02/2023 209 (H) 0 - 99 mg/dL Final   HDL  Date Value Ref Range Status  03/02/2023 47 >39 mg/dL Final   Triglycerides  Date Value Ref Range Status  03/02/2023 169 (H) 0 - 149 mg/dL Final         Passed - ALT in normal range and within 360 days    ALT  Date Value Ref Range Status  03/31/2023 20 0 - 44 U/L Final  04/24/2020 20 0 - 44 U/L Final  04/08/2016 23 0 - 55 U/L Final         Passed - AST in normal range and within 360 days    AST  Date Value Ref Range Status  03/31/2023 24 15 - 41 U/L Final  04/24/2020 23 15 - 41 U/L Final  04/08/2016 22 5 - 34 U/L  Final         Passed - Cr in normal range and within 360 days    Creatinine  Date Value Ref Range Status  04/24/2020 0.89 0.44 - 1.00 mg/dL Final  54/09/8117 0.9 0.6 - 1.1 mg/dL Final   Creatinine, Ser  Date Value Ref Range Status  03/31/2023 1.00 0.44 - 1.00 mg/dL Final         Passed - HGB in normal range and within 360 days    Hemoglobin  Date Value Ref Range Status  03/31/2023 15.0 12.0 - 15.0 g/dL Final  14/78/2956 21.3 (H) 11.1 - 15.9 g/dL Final   HGB  Date Value Ref Range Status  04/08/2016 15.2 11.6 - 15.9 g/dL Final         Passed - HCT in normal range and within 360 days    HCT  Date Value Ref Range Status  03/31/2023 45.0 36.0 - 46.0 % Final  04/08/2016 44.6 34.8 - 46.6 % Final   Hematocrit  Date Value Ref Range Status  03/02/2023 49.9 (H) 34.0 - 46.6 % Final         Passed - PLT in normal range and within 360 days    Platelets  Date Value Ref Range Status  03/31/2023 310 150 - 400 K/uL Final  03/02/2023 361 150 - 450 x10E3/uL Final         Passed - WBC in normal range and within 360 days    WBC  Date Value Ref Range Status  03/31/2023 6.7 4.0 - 10.5 K/uL Final         Passed - eGFR is 30 or above and within 360 days    GFR, Est AFR  Am  Date Value Ref Range Status  04/09/2019 >60 >60 mL/min Final   GFR, Estimated  Date Value Ref Range Status  03/31/2023 >60 >60 mL/min Final    Comment:    (NOTE) Calculated using the CKD-EPI Creatinine Equation (2021)   04/24/2020 >60 >60 mL/min Final    Comment:    (NOTE) Calculated using the CKD-EPI Creatinine Equation (2021)    eGFR  Date Value Ref Range Status  03/02/2023 60 >59 mL/min/1.73 Final         Passed - Valid encounter within last 12 months    Recent Outpatient Visits           3 months ago Annual physical exam   [place] [facility], MD   11 months ago Essential hypertension   [place] [facility], MD   1 year ago Diet-controlled diabetes mellitus [facility])   [place] [facility], MD   1 year ago Diet-controlled diabetes mellitus [facility])   [place] [facility] at [facility], MD   2 years ago Insomnia, unspecified type   [place] [facility] Ray Caffey, MD               LORazepam  (ATIVAN ) 1 MG tablet 30 tablet 0    Sig: Take 0.5 tablets (0.5 mg total) by mouth at bedtime as needed for anxiety (for sleep).     Not Delegated - Psychiatry: Anxiolytics/Hypnotics 2 Failed - 06/27/2023 12:20 PM      Failed - This refill cannot be delegated      Failed - Urine Drug Screen completed in last 360 days      Passed - Patient is not  pregnant      Passed - Valid encounter within last 6 months    Recent Outpatient Visits           3 months ago Annual physical exam   [place] [facility], MD   11 months ago Essential hypertension   [place] [facility], MD   1 year ago Diet-controlled diabetes mellitus [facility])   [place] [facility], MD   1 year ago Diet-controlled diabetes mellitus [facility])     [facility] at [facility], MD   2 years ago Insomnia, unspecified type   [facility] at [facility], MD

## 2023-06-28 ENCOUNTER — Other Ambulatory Visit: Payer: Self-pay

## 2023-06-28 ENCOUNTER — Other Ambulatory Visit: Payer: Self-pay | Admitting: Family

## 2023-06-28 DIAGNOSIS — F419 Anxiety disorder, unspecified: Secondary | ICD-10-CM

## 2023-06-28 MED ORDER — LORAZEPAM 1 MG PO TABS
0.5000 mg | ORAL_TABLET | Freq: Every evening | ORAL | 0 refills | Status: DC | PRN
Start: 2023-06-28 — End: 2023-09-01
  Filled 2023-06-28: qty 30, 60d supply, fill #0

## 2023-06-28 NOTE — Telephone Encounter (Signed)
-   Lorazepam  prescribed 06/28/2023. I did check the [place]  prescription drug database.  -  Hydrochlorothiazide  and Fenofibrate  prescribed 06/27/2023.

## 2023-06-30 ENCOUNTER — Telehealth: Payer: Self-pay

## 2023-06-30 DIAGNOSIS — R053 Chronic cough: Secondary | ICD-10-CM | POA: Diagnosis not present

## 2023-06-30 DIAGNOSIS — J392 Other diseases of pharynx: Secondary | ICD-10-CM | POA: Diagnosis not present

## 2023-06-30 NOTE — Telephone Encounter (Signed)
 She called and left a message. It has been 5 years since her last colonoscopy. She is asking if she should contact Dr. Marylynn Soho office for colonoscopy due to the history of colon cancer.

## 2023-06-30 NOTE — Telephone Encounter (Signed)
Please send a referral

## 2023-06-30 NOTE — Telephone Encounter (Signed)
 Called her back and told her the office will send a referral to GI. She verbalized understanding. Referral faxed to [phone], received a fax confirmation.

## 2023-07-28 ENCOUNTER — Encounter: Payer: Self-pay | Admitting: Hematology and Oncology

## 2023-08-01 ENCOUNTER — Telehealth: Payer: Self-pay | Admitting: *Deleted

## 2023-08-01 NOTE — Telephone Encounter (Signed)
 This RN called pt per her My Chart messages - 1 left late in day on 7/3 then additional one left yesterday.  Hannah Frazier states onset of right side pain - which worsened by 7/3- reported as sudden sharp pains.  She states over the weekend the pain has now subsided and is minimal.  She states she is having daily normal BM's.  She did not take any medications for relief of symptoms.  Her concern is related to her history of cancer an the pain being near the area of her prior tumor and surgery.  She denies any nausea.   Just not sure and more concerned that it could be my cancer .  She is currently not seeing a GI doctor and her colonoscopy is not intended until October.  This RN informed above would be forwarded to MD for review for further recommendation.

## 2023-08-02 ENCOUNTER — Inpatient Hospital Stay: Admitting: Hematology and Oncology

## 2023-08-02 ENCOUNTER — Telehealth: Payer: Self-pay

## 2023-08-02 ENCOUNTER — Inpatient Hospital Stay: Attending: Hematology and Oncology

## 2023-08-02 ENCOUNTER — Ambulatory Visit: Admitting: Hematology and Oncology

## 2023-08-02 ENCOUNTER — Encounter: Payer: Self-pay | Admitting: Hematology and Oncology

## 2023-08-02 ENCOUNTER — Other Ambulatory Visit

## 2023-08-02 VITALS — BP 144/90 | HR 92 | Temp 97.4°F | Resp 18 | Wt 178.4 lb

## 2023-08-02 DIAGNOSIS — R1011 Right upper quadrant pain: Secondary | ICD-10-CM

## 2023-08-02 DIAGNOSIS — Z79899 Other long term (current) drug therapy: Secondary | ICD-10-CM | POA: Insufficient documentation

## 2023-08-02 DIAGNOSIS — Z8572 Personal history of non-Hodgkin lymphomas: Secondary | ICD-10-CM | POA: Diagnosis not present

## 2023-08-02 LAB — CBC WITH DIFFERENTIAL/PLATELET
Abs Immature Granulocytes: 0.01 K/uL (ref 0.00–0.07)
Basophils Absolute: 0.1 K/uL (ref 0.0–0.1)
Basophils Relative: 1 %
Eosinophils Absolute: 0.1 K/uL (ref 0.0–0.5)
Eosinophils Relative: 2 %
HCT: 44.7 % (ref 36.0–46.0)
Hemoglobin: 15.2 g/dL — ABNORMAL HIGH (ref 12.0–15.0)
Immature Granulocytes: 0 %
Lymphocytes Relative: 35 %
Lymphs Abs: 2.2 K/uL (ref 0.7–4.0)
MCH: 28.1 pg (ref 26.0–34.0)
MCHC: 34 g/dL (ref 30.0–36.0)
MCV: 82.6 fL (ref 80.0–100.0)
Monocytes Absolute: 0.4 K/uL (ref 0.1–1.0)
Monocytes Relative: 7 %
Neutro Abs: 3.6 K/uL (ref 1.7–7.7)
Neutrophils Relative %: 55 %
Platelets: 300 K/uL (ref 150–400)
RBC: 5.41 MIL/uL — ABNORMAL HIGH (ref 3.87–5.11)
RDW: 12.8 % (ref 11.5–15.5)
WBC: 6.5 K/uL (ref 4.0–10.5)
nRBC: 0 % (ref 0.0–0.2)

## 2023-08-02 LAB — COMPREHENSIVE METABOLIC PANEL WITH GFR
ALT: 18 U/L (ref 0–44)
AST: 20 U/L (ref 15–41)
Albumin: 4.2 g/dL (ref 3.5–5.0)
Alkaline Phosphatase: 63 U/L (ref 38–126)
Anion gap: 7 (ref 5–15)
BUN: 10 mg/dL (ref 8–23)
CO2: 32 mmol/L (ref 22–32)
Calcium: 9.7 mg/dL (ref 8.9–10.3)
Chloride: 100 mmol/L (ref 98–111)
Creatinine, Ser: 0.96 mg/dL (ref 0.44–1.00)
GFR, Estimated: >60 mL/min (ref >60)
Glucose, Bld: 144 mg/dL — ABNORMAL HIGH (ref 70–99)
Potassium: 3.6 mmol/L (ref 3.5–5.1)
Sodium: 139 mmol/L (ref 135–145)
Total Bilirubin: 0.9 mg/dL (ref 0.0–1.2)
Total Protein: 7.7 g/dL (ref 6.5–8.1)

## 2023-08-02 NOTE — Assessment & Plan Note (Addendum)
 She has stage I diagnosis of lymphoma, extranodal: mass in terminal ileum presenting with abdominal pain April, 2011 She did not require adjuvant treatment Her last imaging study show no signs of cancer recurrence However, with recent right upper quadrant/abdominal pain/cramping similar to her prior symptom, I recommend CT imaging for further evaluation Her examination today is unrevealing

## 2023-08-02 NOTE — Progress Notes (Signed)
 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, extranodal: mass in terminal ileum presenting with abdominal pain April, 2011 She did not require adjuvant treatment Her last imaging study show no signs of cancer recurrence However, with recent right upper quadrant/abdominal pain/cramping similar to her prior symptom, I recommend CT imaging for further evaluation Her examination today is unrevealing  Orders Placed This Encounter  Procedures   CT ABDOMEN PELVIS W CONTRAST    Standing Status:   Future    Expected Date:   08/12/2023    Expiration Date:   08/01/2024    If indicated for the ordered procedure, I authorize the administration of contrast media per Radiology protocol:   Yes    Does the patient have a contrast media/X-ray dye allergy?:   No    Preferred imaging location?:   [facility]    If indicated for the ordered procedure, I authorize the administration of oral contrast media per Radiology protocol:   Yes     Almarie Bedford, MD  INTERVAL HISTORY: she returns for urgent evaluation due to concern for recurrent lymphoma She has remote history of lymphoma in 2011 with abdominal pain/discomfort on the right side of her abdomen She had symptoms starting on June 30 that comes and goes for several days It is not associated with nausea or changes in bowel habits She is not on new medications She denies urinary frequency, urgency or dysuria or fever or chills  PHYSICAL EXAMINATION: ECOG PERFORMANCE STATUS: 1 - Symptomatic but  completely ambulatory  Vitals:   08/02/23 1215  BP: (!) 144/90  Pulse: 92  Resp: 18  Temp: (!) 97.4 F (36.3 C)  SpO2: 97%   Filed Weights   08/02/23 1215  Weight: 178 lb 6.4 oz (80.9 kg)   GENERAL:alert, no distress and comfortable SKIN: skin color, texture, turgor are normal, no rashes or significant lesions EYES: normal, conjunctiva are pink and non-injected, sclera clear OROPHARYNX:no exudate, no erythema and lips, buccal mucosa, and tongue normal  NECK: supple, thyroid  normal size, non-tender, without nodularity LYMPH:  no palpable lymphadenopathy in the cervical, axillary or inguinal LUNGS: clear to auscultation and percussion with normal breathing effort HEART: regular rate & rhythm and no murmurs and no lower extremity edema ABDOMEN:abdomen soft, non-tender and normal bowel sounds Musculoskeletal:no cyanosis of digits and no clubbing  PSYCH: alert & oriented x 3 with fluent speech NEURO: no focal motor/sensory deficits  Relevant data reviewed during this visit included CBC and CMP

## 2023-08-02 NOTE — Telephone Encounter (Signed)
 Pt's appts for today r/s to 1200 lab and 1240 MD f/u per MD. Pt is agreeable.

## 2023-08-02 NOTE — Telephone Encounter (Signed)
 Called pt and offered appt regarding concerns per MD. She is agreeable and scheduled for 1345 labs with 1420 MD appt. She knows to arrive at 1330 for check in.

## 2023-08-02 NOTE — Assessment & Plan Note (Addendum)
 Last imaging study was in 2022 With new onset of symptoms, I recommend CT imaging for further evaluation and she agreed

## 2023-08-02 NOTE — Telephone Encounter (Signed)
 Leita, please call and ask if she can come in at 220 pm today

## 2023-08-12 ENCOUNTER — Ambulatory Visit (HOSPITAL_COMMUNITY)
Admission: RE | Admit: 2023-08-12 | Discharge: 2023-08-12 | Disposition: A | Discharge status: Home / Self Care | Source: Ambulatory Visit | Attending: Hematology and Oncology | Admitting: Hematology and Oncology

## 2023-08-12 DIAGNOSIS — R109 Unspecified abdominal pain: Secondary | ICD-10-CM | POA: Diagnosis not present

## 2023-08-12 DIAGNOSIS — R1011 Right upper quadrant pain: Secondary | ICD-10-CM | POA: Insufficient documentation

## 2023-08-12 DIAGNOSIS — Z8572 Personal history of non-Hodgkin lymphomas: Secondary | ICD-10-CM | POA: Diagnosis not present

## 2023-08-12 MED ORDER — IOHEXOL 9 MG/ML PO SOLN
ORAL | Status: AC
  Filled 2023-08-12: qty 1000

## 2023-08-12 MED ORDER — IOHEXOL 9 MG/ML PO SOLN
1000.0000 mL | Freq: Once | ORAL | Status: AC | PRN
  Administered 2023-08-12: 500 mL via ORAL

## 2023-08-12 MED ORDER — IOHEXOL 300 MG/ML  SOLN
100.0000 mL | Freq: Once | INTRAMUSCULAR | Status: AC | PRN
  Administered 2023-08-12: 100 mL via INTRAVENOUS

## 2023-08-15 ENCOUNTER — Encounter: Payer: Self-pay | Admitting: Hematology and Oncology

## 2023-08-17 ENCOUNTER — Ambulatory Visit: Payer: Self-pay | Admitting: Hematology and Oncology

## 2023-08-18 ENCOUNTER — Encounter: Payer: Self-pay | Admitting: *Deleted

## 2023-08-19 ENCOUNTER — Inpatient Hospital Stay: Admitting: Hematology and Oncology

## 2023-08-19 ENCOUNTER — Encounter: Payer: Self-pay | Admitting: Hematology and Oncology

## 2023-08-19 ENCOUNTER — Telehealth: Payer: Self-pay | Admitting: *Deleted

## 2023-08-19 DIAGNOSIS — R1011 Right upper quadrant pain: Secondary | ICD-10-CM | POA: Diagnosis not present

## 2023-08-19 DIAGNOSIS — Z8572 Personal history of non-Hodgkin lymphomas: Secondary | ICD-10-CM

## 2023-08-19 NOTE — Progress Notes (Signed)
 HEMATOLOGY-ONCOLOGY ELECTRONIC VISIT PROGRESS NOTE  Patient Care Team: Tanda Bleacher, MD as PCP - General (Family Medicine) O'Neal, Darryle Ned, MD as PCP - Cardiology (Cardiology) Burnette Fallow, MD as Consulting Physician (Gastroenterology) [facility])  I connected with the patient via telephone conference and verified that I am speaking with the correct person using two identifiers. The patient's location is at home and I am providing care from the [facility] I discussed the limitations, risks, security and privacy concerns of performing an evaluation and management service by e-visits and the availability of in person appointments.  I also discussed with the patient that there may be a patient responsible charge related to this service. The patient expressed understanding and agreed to proceed.   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, extranodal: mass in terminal ileum presenting with abdominal pain April, 2011 She did not require adjuvant treatment Her last imaging study show no signs of cancer recurrence However, with recent right upper quadrant/abdominal pain/cramping similar to her prior symptom, I recommend CT imaging for further evaluation I reviewed CT imaging which showed no definitive evidence of lymphoma However, given her prior diagnosis being extranodal disease in the terminal ileum, I recommend the patient proceed with colonoscopy sooner than later for evaluation and she agrees We will provide information including imaging study results to her gastroenterologist I will see her in a year for further follow-up  No orders of the defined types were placed in this encounter.   INTERVAL HISTORY: Please see below for problem oriented charting. The purpose of today's discussion is to review test results She continues to have mild intermittent discomfort in the right side of her abdomen We discussed imaging  study result and the role for colonoscopy for further evaluation  SUMMARY OF ONCOLOGIC HISTORY: Oncology History Overview Note  Lymphoma, high grade involving the terminal ileum   Primary site: Lymphoid Neoplasms   Staging method: AJCC 6th Edition   Clinical: Stage I signed by Almarie Bedford, MD on 04/23/2013  1:17 PM   Pathologic: Stage I signed by Almarie Bedford, MD on 04/23/2013  1:17 PM   Summary: Stage I     History of B-cell lymphoma  04/28/2009 Procedure   Colonoscopy and biopsy show possible lymphoma involving the terminal ileum   05/08/2009 Bone Marrow Biopsy   Bone marrow biopsy was negative.   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   06/06/2009 - 09/22/2009 Chemotherapy   The patient completed 6 cycles of R. CHOP chemotherapy.   06/12/2009 Imaging   PET CT scan show no evidence of residual disease.   10/16/2009 Imaging   PET CT scan show no evidence of residual disease.   06/01/2012 Procedure   Colonoscopy showed no evidence of recurrence of disease.   10/24/2012 Imaging   CT scan show no evidence of recurrence of disease.   10/11/2013 Imaging   CT scan show no evidence of disease.   04/22/2015 Imaging   CT scan show no evidence of disease.   05/02/2020 Imaging   Circumferential wall thickening involving the second and third portion of the duodenum suggestive of an infectious or inflammatory duodenitis. No evidence of obstruction or perforation.   Mild hepatic steatosis.   Aortic Atherosclerosis (ICD10-I70.0).     05/19/2020 Procedure   She underwent EGD by Dr. Burnette at [facility] Findings The examined esophagus was  normal Patchy mild inflammation was found in the gastric fundus, in the gastric body and in the gastric antrum Biopsies were taken with a cold forceps for histology The exam of the stomach was otherwise normal The duodenal bulb,  first portion of the duodenum, second portion of the duodenum and third portion of the duodenum were normal   08/13/2020 Imaging   1. No findings in the abdomen or pelvis to suggest recurrence of patient's lymphoma. 2. Resolution of previously visualized circumferential wall thickening involving the duodenum. 3. Moderate volume of formed stool throughout the colon. 4.  Aortic Atherosclerosis (ICD10-I70.0).     08/12/2023 Imaging   CT ABDOMEN PELVIS W CONTRAST Result Date: 08/17/2023 CLINICAL DATA:  Hematologic malignancy. Monitor. Right upper quadrant abdominal pain. History of lymphoma. EXAM: CT ABDOMEN AND PELVIS WITH CONTRAST TECHNIQUE: Multidetector CT imaging of the abdomen and pelvis was performed using the standard protocol following bolus administration of intravenous contrast. RADIATION DOSE REDUCTION: This exam was performed according to the departmental dose-optimization program which includes automated exposure control, adjustment of the mA and/or kV according to patient size and/or use of iterative reconstruction technique. CONTRAST:  OMNIPAQUE  IOHEXOL  300 MG/ML  SOLN COMPARISON:  CT abdomen pelvis dated 08/13/2020. FINDINGS: Lower chest: The visualized lung bases are clear. No intra-abdominal free air or free fluid. Hepatobiliary: The liver is unremarkable. No biliary ductal dilatation. The gallbladder is unremarkable Pancreas: Unremarkable. No pancreatic ductal dilatation or surrounding inflammatory changes. Spleen: Normal in size without focal abnormality. Adrenals/Urinary Tract: The adrenal glands unremarkable. There is no hydronephrosis on either side. There is symmetric enhancement and excretion of contrast by both kidneys. The visualized ureters and urinary bladder appear unremarkable. Stomach/Bowel: There is no bowel obstruction or active inflammation. Appendectomy. Vascular/Lymphatic: Mild aortoiliac atherosclerotic disease. The IVC is unremarkable. No portal venous gas. There is  no adenopathy. Reproductive: Hysterectomy.  No suspicious adnexal masses. Other: None Musculoskeletal: Osteopenia with degenerative changes. No acute osseous pathology. IMPRESSION: 1. No acute intra-abdominal or pelvic pathology. No adenopathy. 2.  Aortic Atherosclerosis (ICD10-I70.0). Electronically Signed   By: Vanetta Chou M.D.   On: 08/17/2023 14:09       I discussed the assessment and treatment plan with the patient. The patient was provided an opportunity to ask questions and all were answered. The patient agreed with the plan and demonstrated an understanding of the instructions. The patient was advised to call back or seek an in-person evaluation if the symptoms worsen or if the condition fails to improve as anticipated.    I spent 20 minutes for the appointment reviewing test results, discuss management and coordination of care.  Almarie Bedford, MD 08/19/2023 1:44 PM

## 2023-08-19 NOTE — Telephone Encounter (Signed)
 Pt notes and scan results were faxed to Dr. Dolan office at [phone]. Receipt of confirmation received

## 2023-08-19 NOTE — Assessment & Plan Note (Signed)
 She has stage I diagnosis of lymphoma, extranodal: mass in terminal ileum presenting with abdominal pain April, 2011 She did not require adjuvant treatment Her last imaging study show no signs of cancer recurrence However, with recent right upper quadrant/abdominal pain/cramping similar to her prior symptom, I recommend CT imaging for further evaluation I reviewed CT imaging which showed no definitive evidence of lymphoma However, given her prior diagnosis being extranodal disease in the terminal ileum, I recommend the patient proceed with colonoscopy sooner than later for evaluation and she agrees We will provide information including imaging study results to her gastroenterologist I will see her in a year for further follow-up

## 2023-08-28 ENCOUNTER — Other Ambulatory Visit: Payer: Self-pay | Admitting: Family

## 2023-08-28 DIAGNOSIS — F419 Anxiety disorder, unspecified: Secondary | ICD-10-CM

## 2023-09-01 ENCOUNTER — Other Ambulatory Visit: Payer: Self-pay | Admitting: Family Medicine

## 2023-09-01 ENCOUNTER — Other Ambulatory Visit: Payer: Self-pay

## 2023-09-01 ENCOUNTER — Encounter: Payer: Self-pay | Admitting: Family Medicine

## 2023-09-01 ENCOUNTER — Other Ambulatory Visit (HOSPITAL_COMMUNITY): Payer: Self-pay

## 2023-09-01 DIAGNOSIS — F419 Anxiety disorder, unspecified: Secondary | ICD-10-CM

## 2023-09-01 MED ORDER — LORAZEPAM 1 MG PO TABS
0.5000 mg | ORAL_TABLET | Freq: Every evening | ORAL | 0 refills | Status: DC | PRN
  Filled 2023-09-01: qty 30, 60d supply, fill #0

## 2023-09-01 MED ORDER — TRAZODONE HCL 100 MG PO TABS
100.0000 mg | ORAL_TABLET | Freq: Every day | ORAL | 2 refills | Status: DC
  Filled 2023-09-01: qty 30, 30d supply, fill #0

## 2023-09-09 ENCOUNTER — Encounter: Payer: Self-pay | Admitting: Family Medicine

## 2023-09-27 ENCOUNTER — Other Ambulatory Visit: Payer: Self-pay

## 2023-09-27 ENCOUNTER — Other Ambulatory Visit: Payer: Self-pay | Admitting: Family Medicine

## 2023-09-27 ENCOUNTER — Other Ambulatory Visit (HOSPITAL_COMMUNITY): Payer: Self-pay

## 2023-09-27 ENCOUNTER — Other Ambulatory Visit: Payer: Self-pay | Admitting: Hematology and Oncology

## 2023-09-27 MED ORDER — PANTOPRAZOLE SODIUM 40 MG PO TBEC
40.0000 mg | DELAYED_RELEASE_TABLET | Freq: Every day | ORAL | 1 refills | Status: DC
  Filled 2023-09-27: qty 90, 90d supply, fill #0

## 2023-09-28 ENCOUNTER — Other Ambulatory Visit (HOSPITAL_COMMUNITY): Payer: Self-pay

## 2023-09-28 ENCOUNTER — Other Ambulatory Visit: Payer: Self-pay

## 2023-09-28 MED ORDER — ONETOUCH ULTRA VI STRP
ORAL_STRIP | 2 refills | Status: AC
  Filled 2023-09-28: qty 100, 90d supply, fill #0
  Filled 2023-12-27: qty 100, 90d supply, fill #1

## 2023-09-29 ENCOUNTER — Other Ambulatory Visit: Payer: Self-pay

## 2023-10-06 ENCOUNTER — Other Ambulatory Visit (HOSPITAL_COMMUNITY): Payer: Self-pay

## 2023-10-06 ENCOUNTER — Other Ambulatory Visit: Payer: Self-pay

## 2023-10-06 DIAGNOSIS — Z9049 Acquired absence of other specified parts of digestive tract: Secondary | ICD-10-CM | POA: Diagnosis not present

## 2023-10-06 DIAGNOSIS — Z86018 Personal history of other benign neoplasm: Secondary | ICD-10-CM | POA: Diagnosis not present

## 2023-10-06 DIAGNOSIS — R109 Unspecified abdominal pain: Secondary | ICD-10-CM | POA: Diagnosis not present

## 2023-10-06 DIAGNOSIS — R110 Nausea: Secondary | ICD-10-CM | POA: Diagnosis not present

## 2023-10-06 MED ORDER — DULCOLAX 5 MG PO TBEC
10.0000 mg | DELAYED_RELEASE_TABLET | Freq: Two times a day (BID) | ORAL | 0 refills | Status: DC
  Filled 2023-10-06: qty 4, 1d supply, fill #0

## 2023-10-06 MED ORDER — PEG 3350-KCL-NA BICARB-NACL 420 G PO SOLR
ORAL | 0 refills | Status: DC
  Filled 2023-10-06: qty 4000, 1d supply, fill #0

## 2023-10-07 ENCOUNTER — Ambulatory Visit
Admission: RE | Admit: 2023-10-07 | Discharge: 2023-10-07 | Disposition: A | Discharge status: Home / Self Care | Source: Ambulatory Visit | Attending: Family Medicine | Admitting: Family Medicine

## 2023-10-07 DIAGNOSIS — N6320 Unspecified lump in the left breast, unspecified quadrant: Secondary | ICD-10-CM

## 2023-10-07 DIAGNOSIS — R928 Other abnormal and inconclusive findings on diagnostic imaging of breast: Secondary | ICD-10-CM | POA: Diagnosis not present

## 2023-10-07 DIAGNOSIS — N6002 Solitary cyst of left breast: Secondary | ICD-10-CM | POA: Diagnosis not present

## 2023-10-10 ENCOUNTER — Encounter (HOSPITAL_COMMUNITY): Payer: Self-pay

## 2023-10-10 ENCOUNTER — Other Ambulatory Visit: Payer: Self-pay

## 2023-10-10 ENCOUNTER — Encounter: Payer: Self-pay | Admitting: Pharmacist

## 2023-10-10 ENCOUNTER — Other Ambulatory Visit (HOSPITAL_COMMUNITY): Payer: Self-pay

## 2023-10-25 DIAGNOSIS — D123 Benign neoplasm of transverse colon: Secondary | ICD-10-CM | POA: Diagnosis not present

## 2023-10-25 DIAGNOSIS — Z860100 Personal history of colon polyps, unspecified: Secondary | ICD-10-CM | POA: Diagnosis not present

## 2023-10-25 DIAGNOSIS — Z09 Encounter for follow-up examination after completed treatment for conditions other than malignant neoplasm: Secondary | ICD-10-CM | POA: Diagnosis not present

## 2023-10-25 DIAGNOSIS — Z980 Intestinal bypass and anastomosis status: Secondary | ICD-10-CM | POA: Diagnosis not present

## 2023-10-27 DIAGNOSIS — D123 Benign neoplasm of transverse colon: Secondary | ICD-10-CM | POA: Diagnosis not present

## 2023-11-01 ENCOUNTER — Encounter (HOSPITAL_COMMUNITY): Payer: Self-pay | Admitting: Emergency Medicine

## 2023-11-01 ENCOUNTER — Inpatient Hospital Stay (HOSPITAL_COMMUNITY)
Admission: EM | Admit: 2023-11-01 | Discharge: 2023-11-05 | DRG: 372 | Disposition: A | Discharge status: Home / Self Care | Attending: Internal Medicine | Admitting: Internal Medicine

## 2023-11-01 ENCOUNTER — Emergency Department (HOSPITAL_COMMUNITY)

## 2023-11-01 ENCOUNTER — Other Ambulatory Visit: Payer: Self-pay

## 2023-11-01 DIAGNOSIS — Z79899 Other long term (current) drug therapy: Secondary | ICD-10-CM

## 2023-11-01 DIAGNOSIS — R55 Syncope and collapse: Secondary | ICD-10-CM | POA: Diagnosis not present

## 2023-11-01 DIAGNOSIS — K529 Noninfective gastroenteritis and colitis, unspecified: Principal | ICD-10-CM | POA: Diagnosis present

## 2023-11-01 DIAGNOSIS — Z8249 Family history of ischemic heart disease and other diseases of the circulatory system: Secondary | ICD-10-CM | POA: Diagnosis not present

## 2023-11-01 DIAGNOSIS — Z8572 Personal history of non-Hodgkin lymphomas: Secondary | ICD-10-CM

## 2023-11-01 DIAGNOSIS — Z1152 Encounter for screening for COVID-19: Secondary | ICD-10-CM | POA: Diagnosis not present

## 2023-11-01 DIAGNOSIS — A020 Salmonella enteritis: Principal | ICD-10-CM | POA: Diagnosis present

## 2023-11-01 DIAGNOSIS — E861 Hypovolemia: Secondary | ICD-10-CM | POA: Diagnosis not present

## 2023-11-01 DIAGNOSIS — I493 Ventricular premature depolarization: Secondary | ICD-10-CM | POA: Diagnosis not present

## 2023-11-01 DIAGNOSIS — E876 Hypokalemia: Secondary | ICD-10-CM | POA: Diagnosis not present

## 2023-11-01 DIAGNOSIS — Z818 Family history of other mental and behavioral disorders: Secondary | ICD-10-CM | POA: Diagnosis not present

## 2023-11-01 DIAGNOSIS — R197 Diarrhea, unspecified: Secondary | ICD-10-CM | POA: Diagnosis not present

## 2023-11-01 DIAGNOSIS — I4510 Unspecified right bundle-branch block: Secondary | ICD-10-CM | POA: Diagnosis not present

## 2023-11-01 DIAGNOSIS — R531 Weakness: Secondary | ICD-10-CM

## 2023-11-01 DIAGNOSIS — E119 Type 2 diabetes mellitus without complications: Secondary | ICD-10-CM | POA: Diagnosis not present

## 2023-11-01 DIAGNOSIS — J189 Pneumonia, unspecified organism: Secondary | ICD-10-CM | POA: Diagnosis not present

## 2023-11-01 DIAGNOSIS — Z90710 Acquired absence of both cervix and uterus: Secondary | ICD-10-CM

## 2023-11-01 DIAGNOSIS — J9811 Atelectasis: Secondary | ICD-10-CM | POA: Diagnosis not present

## 2023-11-01 DIAGNOSIS — I491 Atrial premature depolarization: Secondary | ICD-10-CM | POA: Diagnosis not present

## 2023-11-01 DIAGNOSIS — Z833 Family history of diabetes mellitus: Secondary | ICD-10-CM

## 2023-11-01 DIAGNOSIS — K760 Fatty (change of) liver, not elsewhere classified: Secondary | ICD-10-CM | POA: Diagnosis not present

## 2023-11-01 DIAGNOSIS — Z823 Family history of stroke: Secondary | ICD-10-CM

## 2023-11-01 DIAGNOSIS — F32A Depression, unspecified: Secondary | ICD-10-CM | POA: Diagnosis not present

## 2023-11-01 DIAGNOSIS — Z8049 Family history of malignant neoplasm of other genital organs: Secondary | ICD-10-CM | POA: Diagnosis not present

## 2023-11-01 DIAGNOSIS — Z820 Family history of epilepsy and other diseases of the nervous system: Secondary | ICD-10-CM | POA: Diagnosis not present

## 2023-11-01 DIAGNOSIS — R42 Dizziness and giddiness: Secondary | ICD-10-CM | POA: Diagnosis not present

## 2023-11-01 DIAGNOSIS — I10 Essential (primary) hypertension: Secondary | ICD-10-CM | POA: Diagnosis not present

## 2023-11-01 DIAGNOSIS — E860 Dehydration: Secondary | ICD-10-CM | POA: Diagnosis present

## 2023-11-01 DIAGNOSIS — K429 Umbilical hernia without obstruction or gangrene: Secondary | ICD-10-CM | POA: Diagnosis not present

## 2023-11-01 DIAGNOSIS — R0989 Other specified symptoms and signs involving the circulatory and respiratory systems: Secondary | ICD-10-CM | POA: Diagnosis not present

## 2023-11-01 DIAGNOSIS — Z791 Long term (current) use of non-steroidal anti-inflammatories (NSAID): Secondary | ICD-10-CM

## 2023-11-01 DIAGNOSIS — E871 Hypo-osmolality and hyponatremia: Secondary | ICD-10-CM | POA: Diagnosis present

## 2023-11-01 DIAGNOSIS — Z66 Do not resuscitate: Secondary | ICD-10-CM | POA: Diagnosis present

## 2023-11-01 DIAGNOSIS — Z888 Allergy status to other drugs, medicaments and biological substances status: Secondary | ICD-10-CM

## 2023-11-01 DIAGNOSIS — F419 Anxiety disorder, unspecified: Secondary | ICD-10-CM | POA: Diagnosis not present

## 2023-11-01 DIAGNOSIS — R590 Localized enlarged lymph nodes: Secondary | ICD-10-CM | POA: Diagnosis not present

## 2023-11-01 DIAGNOSIS — R0602 Shortness of breath: Secondary | ICD-10-CM | POA: Diagnosis not present

## 2023-11-01 DIAGNOSIS — K219 Gastro-esophageal reflux disease without esophagitis: Secondary | ICD-10-CM | POA: Diagnosis present

## 2023-11-01 DIAGNOSIS — R110 Nausea: Secondary | ICD-10-CM | POA: Diagnosis not present

## 2023-11-01 LAB — RESP PANEL BY RT-PCR (RSV, FLU A&B, COVID)  RVPGX2
Influenza A by PCR: NEGATIVE
Influenza B by PCR: NEGATIVE
Resp Syncytial Virus by PCR: NEGATIVE
SARS Coronavirus 2 by RT PCR: NEGATIVE

## 2023-11-01 LAB — COMPREHENSIVE METABOLIC PANEL WITH GFR
ALT: 12 U/L (ref 0–44)
AST: 33 U/L (ref 15–41)
Albumin: 3.9 g/dL (ref 3.5–5.0)
Alkaline Phosphatase: 62 U/L (ref 38–126)
Anion gap: 13 (ref 5–15)
BUN: 11 mg/dL (ref 8–23)
CO2: 21 mmol/L — ABNORMAL LOW (ref 22–32)
Calcium: 8.7 mg/dL — ABNORMAL LOW (ref 8.9–10.3)
Chloride: 99 mmol/L (ref 98–111)
Creatinine, Ser: 0.9 mg/dL (ref 0.44–1.00)
GFR, Estimated: >60 mL/min (ref >60)
Glucose, Bld: 157 mg/dL — ABNORMAL HIGH (ref 70–99)
Potassium: 3.8 mmol/L (ref 3.5–5.1)
Sodium: 134 mmol/L — ABNORMAL LOW (ref 135–145)
Total Bilirubin: 1.3 mg/dL — ABNORMAL HIGH (ref 0.0–1.2)
Total Protein: 7.1 g/dL (ref 6.5–8.1)

## 2023-11-01 LAB — CBC WITH DIFFERENTIAL/PLATELET
Abs Immature Granulocytes: 0.05 K/uL (ref 0.00–0.07)
Basophils Absolute: 0 K/uL (ref 0.0–0.1)
Basophils Relative: 0 %
Eosinophils Absolute: 0 K/uL (ref 0.0–0.5)
Eosinophils Relative: 0 %
HCT: 39.2 % (ref 36.0–46.0)
Hemoglobin: 12.6 g/dL (ref 12.0–15.0)
Immature Granulocytes: 0 %
Lymphocytes Relative: 8 %
Lymphs Abs: 0.9 K/uL (ref 0.7–4.0)
MCH: 28.4 pg (ref 26.0–34.0)
MCHC: 32.1 g/dL (ref 30.0–36.0)
MCV: 88.3 fL (ref 80.0–100.0)
Monocytes Absolute: 0.4 K/uL (ref 0.1–1.0)
Monocytes Relative: 4 %
Neutro Abs: 9.8 K/uL — ABNORMAL HIGH (ref 1.7–7.7)
Neutrophils Relative %: 88 %
Platelets: 220 K/uL (ref 150–400)
RBC: 4.44 MIL/uL (ref 3.87–5.11)
RDW: 13.7 % (ref 11.5–15.5)
WBC: 11.2 K/uL — ABNORMAL HIGH (ref 4.0–10.5)
nRBC: 0 % (ref 0.0–0.2)

## 2023-11-01 LAB — C DIFFICILE QUICK SCREEN W PCR REFLEX
C Diff antigen: NEGATIVE
C Diff interpretation: NOT DETECTED
C Diff toxin: NEGATIVE

## 2023-11-01 LAB — MAGNESIUM: Magnesium: 2 mg/dL (ref 1.7–2.4)

## 2023-11-01 LAB — LIPASE, BLOOD: Lipase: 21 U/L (ref 11–51)

## 2023-11-01 MED ORDER — IOHEXOL 300 MG/ML  SOLN
100.0000 mL | Freq: Once | INTRAMUSCULAR | Status: AC | PRN
Start: 2023-11-01 — End: 2023-11-01
  Administered 2023-11-01: 100 mL via INTRAVENOUS

## 2023-11-01 MED ORDER — BISACODYL 5 MG PO TBEC: 5.0000 mg | DELAYED_RELEASE_TABLET | Freq: Every day | ORAL | Status: DC | PRN

## 2023-11-01 MED ORDER — ONDANSETRON HCL 4 MG PO TABS: 4.0000 mg | ORAL_TABLET | Freq: Four times a day (QID) | ORAL | Status: DC | PRN

## 2023-11-01 MED ORDER — ACETAMINOPHEN 650 MG RE SUPP: 650.0000 mg | Freq: Four times a day (QID) | RECTAL | Status: DC | PRN

## 2023-11-01 MED ORDER — ONDANSETRON HCL 4 MG/2ML IJ SOLN
4.0000 mg | Freq: Once | INTRAMUSCULAR | Status: AC
  Administered 2023-11-01: 4 mg via INTRAVENOUS
  Filled 2023-11-01: qty 2

## 2023-11-01 MED ORDER — CIPROFLOXACIN IN D5W 400 MG/200ML IV SOLN
400.0000 mg | Freq: Once | INTRAVENOUS | Status: AC
  Administered 2023-11-01: 400 mg via INTRAVENOUS
  Filled 2023-11-01: qty 200

## 2023-11-01 MED ORDER — SODIUM CHLORIDE 0.9 % IV SOLN: INTRAVENOUS | Status: DC

## 2023-11-01 MED ORDER — SODIUM CHLORIDE 0.9 % IV BOLUS
1000.0000 mL | Freq: Once | INTRAVENOUS | Status: AC
  Administered 2023-11-01: 1000 mL via INTRAVENOUS

## 2023-11-01 MED ORDER — ONDANSETRON HCL 4 MG/2ML IJ SOLN
4.0000 mg | Freq: Four times a day (QID) | INTRAMUSCULAR | Status: DC | PRN
  Administered 2023-11-01 – 2023-11-03 (×6): 4 mg via INTRAVENOUS
  Filled 2023-11-01 (×7): qty 2

## 2023-11-01 MED ORDER — ENOXAPARIN SODIUM 40 MG/0.4ML IJ SOSY
40.0000 mg | PREFILLED_SYRINGE | INTRAMUSCULAR | Status: DC
  Administered 2023-11-01 – 2023-11-04 (×4): 40 mg via SUBCUTANEOUS
  Filled 2023-11-01 (×4): qty 0.4

## 2023-11-01 MED ORDER — ACETAMINOPHEN 325 MG PO TABS
650.0000 mg | ORAL_TABLET | Freq: Four times a day (QID) | ORAL | Status: DC | PRN
  Administered 2023-11-01: 650 mg via ORAL
  Filled 2023-11-01: qty 2

## 2023-11-01 MED ORDER — SENNOSIDES-DOCUSATE SODIUM 8.6-50 MG PO TABS: 1.0000 | ORAL_TABLET | Freq: Every evening | ORAL | Status: DC | PRN

## 2023-11-01 MED ORDER — ENSURE PLUS HIGH PROTEIN PO LIQD
237.0000 mL | Freq: Three times a day (TID) | ORAL | Status: DC
Start: 2023-11-01 — End: 2023-11-05
  Administered 2023-11-02: 237 mL via ORAL
  Filled 2023-11-01: qty 237

## 2023-11-01 MED ORDER — METRONIDAZOLE 500 MG/100ML IV SOLN
500.0000 mg | Freq: Once | INTRAVENOUS | Status: AC
  Administered 2023-11-01: 500 mg via INTRAVENOUS
  Filled 2023-11-01: qty 100

## 2023-11-01 NOTE — H&P (Incomplete)
 History and Physical  Hannah Frazier FMW:[id number] DOB: [birth year] DOA: 11/01/2023  PCP: Tanda Bleacher, MD   Chief Complaint: Generalized weakness, dizziness and diarrhea  HPI: Hannah Frazier is a 73 y.o. female with medical history significant for T2DM, B-cell lymphoma, HTN, anxiety and depression, arthritis and a recent colonoscopy who presented to the ED for evaluation of generalized weakness, dizziness and diarrhea. Patient reports she had a colonoscopy on Wednesday and found a benign polyp. She has had poor appetite since Sunday and on Monday, she started having dizziness and chills. Today, she woke up with profuse nonbloody diarrhea and has been persistent all day. She reports feeling weak and has had decreased fluid intake in the setting of her fall appetite but denies any abdominal pain, nausea, vomiting, bloody stools, fever, chest pain, shortness of breath or syncope.  She denies any recent antibiotics use, drinking unfiltered or stream water or eating restaurant food.  ED Course: Initial vitals show temp 98.8->100.5, SBP 120-150s, SpO2 98% on room air. Initial labs significant for sodium 134, K+ 3.8, bicarb 21, glucose 157, normal renal function, bilirubin 1.3, WBC 11.2, negative flu, RSV and COVID test, negative C. difficile screen. EKG shows sinus rhythm with RBBB and occasional PVCs. CXR shows no active disease.  CT A/P shows evidence of colitis but no associated abscess or collection.  Pt received IV NS 1 L bolus, IV Zofran , IV Cipro  and IV Flagyl . [facility] was consulted for admission.   Review of Systems: Please see HPI for pertinent positives and negatives. A complete 10 system review of systems are otherwise negative.  Past Medical History:  Diagnosis Date   Allergy    No medication   Anxiety 02/08/2011   Arthritis    No medication   Depression    No medication   Diabetes mellitus    Headache    Heart murmur    No medication   History of B-cell lymphoma 02/08/2011    Extranodal: mass in terminal ileum presenting with abdominal pain April, 2011   Hypertension    nhl dx'd 04/2009   chemo comp 08/2009   Vision abnormalities    Past Surgical History:  Procedure Laterality Date   ABDOMINAL HYSTERECTOMY     APPENDECTOMY  1989   COLECTOMY     Social History:  reports that she has never smoked. She has never used smokeless tobacco. She reports that she does not drink alcohol and does not use drugs.  Allergies  Allergen Reactions   Simvastatin Swelling and Rash    Tongue swelling  Tongue swelling Tongue swelling    Lisinopril Nausea Only and Other (See Comments)   Sertraline Hcl Other (See Comments)    Family History  Problem Relation Age of Onset   Cancer Mother        cervical ca   Parkinson's disease Mother    Heart disease Father    Diabetes Father    Hypertension Father    ADD / ADHD Son    ADD / ADHD Daughter    Anxiety disorder Son    Anxiety disorder Daughter    Stroke Brother      Prior to Admission medications   Medication Sig Start Date End Date Taking? Authorizing Provider  Blood Glucose Monitoring Suppl (ONE TOUCH ULTRA 2) w/Device KIT Use to check blood sugars two times a day 05/03/23   Tanda Bleacher, MD  bisacodyl  (DULCOLAX) 5 MG EC tablet Take 2 tablets (10 mg total) by mouth 2 (two) times daily  as directed. 10/06/23     fenofibrate  (TRICOR ) 48 MG tablet Take 1 tablet (48 mg total) by mouth daily. 06/27/23   Tanda Bleacher, MD  hydrochlorothiazide  (HYDRODIURIL ) 25 MG tablet Take 1 tablet by mouth every morning. 06/27/23   Tanda Bleacher, MD  LORazepam  (ATIVAN ) 1 MG tablet Take 0.5 tablets (0.5 mg total) by mouth at bedtime as needed for anxiety (for sleep). 09/01/23   Tanda Bleacher, MD  meloxicam  (MOBIC ) 15 MG tablet Take 1 tablet by mouth once a day with food 10/27/22     metroNIDAZOLE  (METROCREAM ) 0.75 % cream Apply dime-size amount to face twice daily and taper use to daily once improved. 10/28/22     ondansetron  (ZOFRAN ) 4 MG  tablet Take 1 tablet (4 mg total) by mouth every 8 (eight) hours as needed for nausea or vomiting. 04/01/23   Tanda Bleacher, MD  [facility] ULTRA test strip USE TO TEST YOUR BLOOD SUGAR ONCE A DAY 09/28/23   Tanda Bleacher, MD  pantoprazole  (PROTONIX ) 40 MG tablet Take 1 tablet (40 mg total) by mouth daily. 09/27/23   Lonn Hicks, MD  polyethylene glycol-electrolytes (NULYTELY) 420 g solution Take 4000ml by mouth as directed. 10/06/23     traZODone  (DESYREL ) 100 MG tablet Take 1 tablet (100 mg total) by mouth at bedtime. 09/01/23   Tanda Bleacher, MD    Physical Exam: BP 122/68 (BP Location: Right Arm)   Pulse 80   Temp 98.7 F (37.1 C) (Oral)   Resp 16   SpO2 94%  General: Pleasant, weak appearing elderly woman laying in bed. No acute distress. HEENT: [place]/AT. Anicteric sclera.  Dry mucous membrane CV: RRR. Soft systolic murmur. No LE edema Pulmonary: Lungs CTAB. Normal effort. No wheezing or rales. Abdominal: Soft, nontender, nondistended. Normal bowel sounds. Extremities: Palpable radial and DP pulses. Normal ROM. Skin: Warm and dry. No obvious rash or lesions. Neuro: A&Ox3. Moves all extremities. Normal sensation to light touch. No focal deficit. Psych: Normal mood and affect          Labs on Admission:  Basic Metabolic Panel: Recent Labs  Lab 11/01/23 1404  NA 134*  K 3.8  CL 99  CO2 21*  GLUCOSE 157*  BUN 11  CREATININE 0.90  CALCIUM 8.7*  MG 2.0   Liver Function Tests: Recent Labs  Lab 11/01/23 1404  AST 33  ALT 12  ALKPHOS 62  BILITOT 1.3*  PROT 7.1  ALBUMIN 3.9   Recent Labs  Lab 11/01/23 1404  LIPASE 21   No results for input(s): AMMONIA in the last 168 hours. CBC: Recent Labs  Lab 11/01/23 1404  WBC 11.2*  NEUTROABS 9.8*  HGB 12.6  HCT 39.2  MCV 88.3  PLT 220   Cardiac Enzymes: No results for input(s): CKTOTAL, CKMB, CKMBINDEX, TROPONINI in the last 168 hours. BNP (last 3 results) No results for input(s): BNP in the last 8760  hours.  ProBNP (last 3 results) No results for input(s): PROBNP in the last 8760 hours.  CBG: No results for input(s): GLUCAP in the last 168 hours.  Radiological Exams on Admission: CT ABDOMEN PELVIS W CONTRAST Result Date: 11/01/2023 CLINICAL DATA:  Diverticulitis, complication suspected. EXAM: CT ABDOMEN AND PELVIS WITH CONTRAST TECHNIQUE: Multidetector CT imaging of the abdomen and pelvis was performed using the standard protocol following bolus administration of intravenous contrast. RADIATION DOSE REDUCTION: This exam was performed according to the departmental dose-optimization program which includes automated exposure control, adjustment of the mA and/or kV according to patient size  and/or use of iterative reconstruction technique. CONTRAST:  OMNIPAQUE  IOHEXOL  300 MG/ML  SOLN COMPARISON:  CT scan abdomen and pelvis from 08/12/2023. FINDINGS: Lower chest: There are subpleural atelectatic changes in the visualized lung bases. No overt consolidation. No pleural effusion. The heart is normal in size. No pericardial effusion. Hepatobiliary: The liver is normal in size. Non-cirrhotic configuration. No suspicious mass. These is mild diffuse hepatic steatosis. No intrahepatic or extrahepatic bile duct dilation. No calcified gallstones. Normal gallbladder wall thickness. No pericholecystic inflammatory changes. Pancreas: Unremarkable. No pancreatic ductal dilatation or surrounding inflammatory changes. Spleen: Within normal limits. No focal lesion. Adrenals/Urinary Tract: Adrenal glands are unremarkable. No suspicious renal mass. No hydronephrosis. No renal or ureteric calculi. Urinary bladder is under distended, precluding optimal assessment. However, no large mass or stones identified. No perivesical fat stranding. Stomach/Bowel: No disproportionate dilation of the small or large bowel loops. There is liquid unformed stool in the colon, nonspecific but commonly seen with diarrhea. Patient is  status post partial right hemicolectomy with end-to-side ileocolonic anastomosis in the right upper quadrant. There is mild mucosal hyperenhancement of the sigmoid colon/rectum with associated prominence of vasa recta concerning for colitis. Findings are most likely infective/inflammatory given the distribution pattern. No associated abscess or collection. No pneumatosis, portal venous gas or pneumoperitoneum. Vascular/Lymphatic: There is stable mildly enlarged right mid abdominal mesenteric lymph node (series 2, image 60). No new abdominal or pelvic lymphadenopathy, by size criteria. No aneurysmal dilation of the major abdominal arteries. There are mild peripheral atherosclerotic vascular calcifications of the aorta and its major branches. Reproductive: The uterus is surgically absent. No large adnexal mass. Other: There is a tiny fat containing umbilical hernia. The soft tissues and abdominal wall are otherwise unremarkable. Musculoskeletal: No suspicious osseous lesions. There are mild - moderate multilevel degenerative changes in the visualized spine. IMPRESSION: 1. Mild mucosal hyperenhancement of the sigmoid colon/rectum with associated prominence of vasa recta, concerning for colitis. No associated abscess or collection. 2. Multiple other nonacute observations, as described above. Aortic Atherosclerosis (ICD10-I70.0). Electronically Signed   By: Ree Molt M.D.   On: 11/01/2023 17:14   DG Chest Portable 1 View Result Date: 11/01/2023 CLINICAL DATA:  Dizziness and shortness of breath. Possible pneumonia. EXAM: PORTABLE CHEST 1 VIEW COMPARISON:  CT scan 03/03/2021 FINDINGS: The cardiac silhouette, mediastinal and hilar contours are within normal limits. Low lung volumes with vascular crowding and streaky atelectasis but no pulmonary infiltrates or pleural effusions. No pneumothorax. The bony thorax is intact. IMPRESSION: Low lung volumes with vascular crowding and streaky atelectasis. Electronically  Signed   By: MYRTIS Stammer M.D.   On: 11/01/2023 14:48   Assessment/Plan Hannah Frazier is a 73 y.o. female with medical history significant for T2DM, B-cell lymphoma, HTN, anxiety and depression, arthritis and a recent colonoscopy who presented to the ED for evaluation of generalized weakness, dizziness and diarrhea and admitted for colitis.  # Colitis - Patient presented with 2 days of dizziness and onset of profuse watery diarrhea today - Patient febrile with mild leukocytosis, imaging shows signs of colitis - Negative C. difficile screen - Continue IV Cipro  and Flagyl  - Start IV NS 150 cc/h - Follow-up GI panel -Trend CBC, fever curve - Enteric precautions  # T2DM, well-controlled - Last A1c 6.2% 8 months ago - SSI with meals, CBG monitoring - Follow-up repeat A1c  # HTN - BP slightly elevated with SBP in the 120s to 150s - Continue hydrochlorothiazide   # GERD - Continue Protonix   #  Anxiety and depression - Currently not on any medications  # Generalized weakness # Dehydration - In the setting of acute illness and poor p.o. intake - IV hydration as above - PT/OT eval and treat - Protein supplementation  DVT prophylaxis: Lovenox     Code Status: Limited: Do not attempt resuscitation (DNR) -DNR-LIMITED -Do Not Intubate/DNI   Consults called: None  Family Communication: Discussed admission with family at bedside  Severity of Illness: The appropriate patient status for this patient is INPATIENT. Inpatient status is judged to be reasonable and necessary in order to provide the required intensity of service to ensure the patient's safety. The patient's presenting symptoms, physical exam findings, and initial radiographic and laboratory data in the context of their chronic comorbidities is felt to place them at high risk for further clinical deterioration. Furthermore, it is not anticipated that the patient will be medically stable for discharge from the hospital  within 2 midnights of admission.   * I certify that at the point of admission it is my clinical judgment that the patient will require inpatient hospital care spanning beyond 2 midnights from the point of admission due to high intensity of service, high risk for further deterioration and high frequency of surveillance required.*  Level of care: Telemetry    Lou Claretta HERO, MD 11/02/2023, 12:26 AM [facility] Pager: [phone] Isaiah 41:10   If 7PM-7AM, please contact night-coverage [email or web address] Password [email or web address]

## 2023-11-01 NOTE — ED Triage Notes (Signed)
 BIB EMS from home.  Frequent falls, dizziness, poor po intake, orthostatic with EMS.  CBG 156.  89% on RA placed on 2L by EMS.  20G LH.  500 mL NS.  No nausea currently.

## 2023-11-01 NOTE — ED Notes (Signed)
 Lab to results GI panel from previously collected sample.

## 2023-11-01 NOTE — ED Notes (Signed)
 Verbal report given to [facility] RN [facility] 4E Room 1418. Awaiting room ready for pt.

## 2023-11-01 NOTE — ED Provider Notes (Signed)
  EMERGENCY DEPARTMENT AT [facility] Provider Note   CSN: [id number] Arrival date & time: 11/01/23  1325     Patient presents with: Near Syncope   Hannah Frazier is a 73 y.o. female presenting to ED with generalized weakness.  Patient ports has been feeling ill for about 3 days.  She reports that 1 week ago she had a colonoscopy which was uneventful.  3 days ago she began having loose stools which worsened over the past 2 days.  She is now having loose, watery diarrhea.  She reports she is feeling very dizzy.  She just has not been drinking a lot of water the past few days to keep up with this.  She denies any chest pain.  She denies any shortness of breath.  She does report some nasal congestion and a dry cough at night.  She denies any underlying lung problems.  Denies any recent antibiotics over the past 3 months, or history of C. difficile, or drinking unfiltered or stream water.  Supplement history provided by EMS, reports that the patient had positive orthostatic vital signs, with systolic pressure dropping from 130 while laying to 80 while standing. EMS reports that the patient's oxygen level was as low as 89% on their arrival and they put her on 2 L nasal cannula.  They also gave 500 cc of fluid prior to arrival   HPI     Prior to Admission medications   Medication Sig Start Date End Date Taking? Authorizing Provider  Blood Glucose Monitoring Suppl (ONE TOUCH ULTRA 2) w/Device KIT Use to check blood sugars two times a day 05/03/23   Tanda Bleacher, MD  bisacodyl  (DULCOLAX) 5 MG EC tablet Take 2 tablets (10 mg total) by mouth 2 (two) times daily as directed. 10/06/23     fenofibrate  (TRICOR ) 48 MG tablet Take 1 tablet (48 mg total) by mouth daily. 06/27/23   Tanda Bleacher, MD  hydrochlorothiazide  (HYDRODIURIL ) 25 MG tablet Take 1 tablet by mouth every morning. 06/27/23   Tanda Bleacher, MD  LORazepam  (ATIVAN ) 1 MG tablet Take 0.5 tablets (0.5 mg total) by mouth  at bedtime as needed for anxiety (for sleep). 09/01/23   Tanda Bleacher, MD  meloxicam  (MOBIC ) 15 MG tablet Take 1 tablet by mouth once a day with food 10/27/22     metroNIDAZOLE  (METROCREAM ) 0.75 % cream Apply dime-size amount to face twice daily and taper use to daily once improved. 10/28/22     ondansetron  (ZOFRAN ) 4 MG tablet Take 1 tablet (4 mg total) by mouth every 8 (eight) hours as needed for nausea or vomiting. 04/01/23   Tanda Bleacher, MD  [facility] ULTRA test strip USE TO TEST YOUR BLOOD SUGAR ONCE A DAY 09/28/23   Tanda Bleacher, MD  pantoprazole  (PROTONIX ) 40 MG tablet Take 1 tablet (40 mg total) by mouth daily. 09/27/23   Lonn Hicks, MD  polyethylene glycol-electrolytes (NULYTELY) 420 g solution Take 4000ml by mouth as directed. 10/06/23     traZODone  (DESYREL ) 100 MG tablet Take 1 tablet (100 mg total) by mouth at bedtime. 09/01/23   Tanda Bleacher, MD    Allergies: Simvastatin, Lisinopril, and Sertraline hcl    Review of Systems  Updated Vital Signs BP (!) 152/130 (BP Location: Left Arm)   Pulse 95   Temp (!) 97.5 F (36.4 C) (Oral)   Resp (!) 21   SpO2 97%   Physical Exam Constitutional:      General: She is not in acute distress.  HENT:     Head: Normocephalic and atraumatic.  Eyes:     Conjunctiva/sclera: Conjunctivae normal.     Pupils: Pupils are equal, round, and reactive to light.  Cardiovascular:     Rate and Rhythm: Normal rate and regular rhythm.  Pulmonary:     Effort: Pulmonary effort is normal. No respiratory distress.     Breath sounds: Normal breath sounds.  Abdominal:     General: There is no distension.     Tenderness: There is no abdominal tenderness.  Skin:    General: Skin is warm and dry.  Neurological:     General: No focal deficit present.     Mental Status: She is alert. Mental status is at baseline.  Psychiatric:        Mood and Affect: Mood normal.        Behavior: Behavior normal.     (all labs ordered are listed, but only abnormal  results are displayed) Labs Reviewed  COMPREHENSIVE METABOLIC PANEL WITH GFR - Abnormal; Notable for the following components:      Result Value   Sodium 134 (*)    CO2 21 (*)    Glucose, Bld 157 (*)    Calcium 8.7 (*)    Total Bilirubin 1.3 (*)    All other components within normal limits  CBC WITH DIFFERENTIAL/PLATELET - Abnormal; Notable for the following components:   WBC 11.2 (*)    Neutro Abs 9.8 (*)    All other components within normal limits  RESP PANEL BY RT-PCR (RSV, FLU A&B, COVID)  RVPGX2  C DIFFICILE QUICK SCREEN W PCR REFLEX    LIPASE, BLOOD  MAGNESIUM  URINALYSIS, ROUTINE W REFLEX MICROSCOPIC    EKG: EKG Interpretation Date/Time:  Tuesday November 01 2023 13:40:59 EDT Ventricular Rate:  87 PR Interval:  155 QRS Duration:  142 QT Interval:  371 QTC Calculation: 447 R Axis:   -31  Text Interpretation: Sinus rhythm Ventricular premature complex Right bundle branch block Confirmed by Cottie Cough [id number]) on 11/01/2023 1:46:12 PM  Radiology: CT ABDOMEN PELVIS W CONTRAST Result Date: 11/01/2023 CLINICAL DATA:  Diverticulitis, complication suspected. EXAM: CT ABDOMEN AND PELVIS WITH CONTRAST TECHNIQUE: Multidetector CT imaging of the abdomen and pelvis was performed using the standard protocol following bolus administration of intravenous contrast. RADIATION DOSE REDUCTION: This exam was performed according to the departmental dose-optimization program which includes automated exposure control, adjustment of the mA and/or kV according to patient size and/or use of iterative reconstruction technique. CONTRAST:  OMNIPAQUE  IOHEXOL  300 MG/ML  SOLN COMPARISON:  CT scan abdomen and pelvis from 08/12/2023. FINDINGS: Lower chest: There are subpleural atelectatic changes in the visualized lung bases. No overt consolidation. No pleural effusion. The heart is normal in size. No pericardial effusion. Hepatobiliary: The liver is normal in size. Non-cirrhotic configuration. No  suspicious mass. These is mild diffuse hepatic steatosis. No intrahepatic or extrahepatic bile duct dilation. No calcified gallstones. Normal gallbladder wall thickness. No pericholecystic inflammatory changes. Pancreas: Unremarkable. No pancreatic ductal dilatation or surrounding inflammatory changes. Spleen: Within normal limits. No focal lesion. Adrenals/Urinary Tract: Adrenal glands are unremarkable. No suspicious renal mass. No hydronephrosis. No renal or ureteric calculi. Urinary bladder is under distended, precluding optimal assessment. However, no large mass or stones identified. No perivesical fat stranding. Stomach/Bowel: No disproportionate dilation of the small or large bowel loops. There is liquid unformed stool in the colon, nonspecific but commonly seen with diarrhea. Patient is status post partial right hemicolectomy with end-to-side ileocolonic anastomosis  in the right upper quadrant. There is mild mucosal hyperenhancement of the sigmoid colon/rectum with associated prominence of vasa recta concerning for colitis. Findings are most likely infective/inflammatory given the distribution pattern. No associated abscess or collection. No pneumatosis, portal venous gas or pneumoperitoneum. Vascular/Lymphatic: There is stable mildly enlarged right mid abdominal mesenteric lymph node (series 2, image 60). No new abdominal or pelvic lymphadenopathy, by size criteria. No aneurysmal dilation of the major abdominal arteries. There are mild peripheral atherosclerotic vascular calcifications of the aorta and its major branches. Reproductive: The uterus is surgically absent. No large adnexal mass. Other: There is a tiny fat containing umbilical hernia. The soft tissues and abdominal wall are otherwise unremarkable. Musculoskeletal: No suspicious osseous lesions. There are mild - moderate multilevel degenerative changes in the visualized spine. IMPRESSION: 1. Mild mucosal hyperenhancement of the sigmoid colon/rectum  with associated prominence of vasa recta, concerning for colitis. No associated abscess or collection. 2. Multiple other nonacute observations, as described above. Aortic Atherosclerosis (ICD10-I70.0). Electronically Signed   By: Ree Molt M.D.   On: 11/01/2023 17:14   DG Chest Portable 1 View Result Date: 11/01/2023 CLINICAL DATA:  Dizziness and shortness of breath. Possible pneumonia. EXAM: PORTABLE CHEST 1 VIEW COMPARISON:  CT scan 03/03/2021 FINDINGS: The cardiac silhouette, mediastinal and hilar contours are within normal limits. Low lung volumes with vascular crowding and streaky atelectasis but no pulmonary infiltrates or pleural effusions. No pneumothorax. The bony thorax is intact. IMPRESSION: Low lung volumes with vascular crowding and streaky atelectasis. Electronically Signed   By: MYRTIS Stammer M.D.   On: 11/01/2023 14:48     Procedures   Medications Ordered in the ED  ciprofloxacin  (CIPRO ) IVPB 400 mg (400 mg Intravenous New Bag/Given 11/01/23 1840)  metroNIDAZOLE  (FLAGYL ) IVPB 500 mg (has no administration in time range)  sodium chloride  0.9 % bolus 1,000 mL (0 mLs Intravenous Stopped 11/01/23 1730)  ondansetron  (ZOFRAN ) injection 4 mg (4 mg Intravenous Given 11/01/23 1613)  iohexol  (OMNIPAQUE ) 300 MG/ML solution 100 mL (100 mLs Intravenous Contrast Given 11/01/23 1628)    Clinical Course as of 11/01/23 1926  Tue Nov 01, 2023  1826 Admitted hospitalist [MT]    Clinical Course User Index [MT] Cottie Donnice PARAS, MD                                 Medical Decision Making Amount and/or Complexity of Data Reviewed Labs: ordered. Radiology: ordered. ECG/medicine tests: ordered.  Risk Prescription drug management. Decision regarding hospitalization.   This patient presents to the ED with concern for lysed weakness, lightheadedness. This involves an extensive number of treatment options, and is a complaint that carries with it a high risk of complications and morbidity.   The differential diagnosis includes dehydration versus anemia versus opportunistic GI infection versus viral illness versus metabolic derangement versus other  Additional history obtained from EMS   I ordered and personally interpreted labs.  The pertinent results include:  WBC 11.2.  Covid pending on admission. Cr wnl  I ordered imaging studies including x-ray of the chest, ct abdomen pelvis I independently visualized and interpreted imaging which showed colitis findings I agree with the radiologist interpretation  The patient was maintained on a cardiac monitor.  I personally viewed and interpreted the cardiac monitored which showed an underlying rhythm of: NSR  Per my interpretation the patient's ECG shows NSR no acute ischemia  I ordered medication including IV antibiotics for  colitis, IV fluids and zofran  for dehydration and nausea  I have reviewed the patients home medicines and have made adjustments as needed   After the interventions noted above, I reevaluated the patient and found that they have: stayed the same - pt still having significant diarrhea while in ED.  High risk of dehydration, falls - will hospitalize overnight  Last BP documented was erroneous, not 152/130, pt has had normal BP here  Dispostion:  After consideration of the diagnostic results and the patients response to treatment, I feel that the patent would benefit from medical admission      Final diagnoses:  Colitis  Near syncope    ED Discharge Orders     None          Zalma Channing, Donnice PARAS, MD 11/01/23 [id number]

## 2023-11-02 ENCOUNTER — Encounter (HOSPITAL_COMMUNITY): Payer: Self-pay | Admitting: Student

## 2023-11-02 DIAGNOSIS — K529 Noninfective gastroenteritis and colitis, unspecified: Secondary | ICD-10-CM | POA: Diagnosis not present

## 2023-11-02 DIAGNOSIS — E860 Dehydration: Secondary | ICD-10-CM

## 2023-11-02 DIAGNOSIS — K219 Gastro-esophageal reflux disease without esophagitis: Secondary | ICD-10-CM

## 2023-11-02 DIAGNOSIS — E119 Type 2 diabetes mellitus without complications: Secondary | ICD-10-CM

## 2023-11-02 DIAGNOSIS — R531 Weakness: Secondary | ICD-10-CM

## 2023-11-02 LAB — GLUCOSE, CAPILLARY
Glucose-Capillary: 125 mg/dL — ABNORMAL HIGH (ref 70–99)
Glucose-Capillary: 134 mg/dL — ABNORMAL HIGH (ref 70–99)
Glucose-Capillary: 114 mg/dL — ABNORMAL HIGH (ref 70–99)
Glucose-Capillary: 111 mg/dL — ABNORMAL HIGH (ref 70–99)

## 2023-11-02 LAB — GASTROINTESTINAL PANEL BY PCR, STOOL (REPLACES STOOL CULTURE)

## 2023-11-02 LAB — URINALYSIS, ROUTINE W REFLEX MICROSCOPIC
Bacteria, UA: NONE SEEN
Bilirubin Urine: NEGATIVE
Glucose, UA: 50 mg/dL — AB
Hgb urine dipstick: NEGATIVE
Ketones, ur: 5 mg/dL — AB
Nitrite: NEGATIVE
Protein, ur: 30 mg/dL — AB
Specific Gravity, Urine: 1.024 (ref 1.005–1.030)
pH: 5 (ref 5.0–8.0)

## 2023-11-02 LAB — BASIC METABOLIC PANEL WITH GFR
Anion gap: 9 (ref 5–15)
BUN: 8 mg/dL (ref 8–23)
CO2: 22 mmol/L (ref 22–32)
Calcium: 8.2 mg/dL — ABNORMAL LOW (ref 8.9–10.3)
Chloride: 102 mmol/L (ref 98–111)
Creatinine, Ser: 0.69 mg/dL (ref 0.44–1.00)
GFR, Estimated: >60 mL/min (ref >60)
Glucose, Bld: 133 mg/dL — ABNORMAL HIGH (ref 70–99)
Potassium: 2.9 mmol/L — ABNORMAL LOW (ref 3.5–5.1)
Sodium: 134 mmol/L — ABNORMAL LOW (ref 135–145)

## 2023-11-02 LAB — CBC
HCT: 38.4 % (ref 36.0–46.0)
Hemoglobin: 12.3 g/dL (ref 12.0–15.0)
MCH: 28 pg (ref 26.0–34.0)
MCHC: 32 g/dL (ref 30.0–36.0)
MCV: 87.3 fL (ref 80.0–100.0)
Platelets: 175 K/uL (ref 150–400)
RBC: 4.4 MIL/uL (ref 3.87–5.11)
RDW: 13.6 % (ref 11.5–15.5)
WBC: 6.9 K/uL (ref 4.0–10.5)
nRBC: 0 % (ref 0.0–0.2)

## 2023-11-02 LAB — HEMOGLOBIN A1C
Hgb A1c MFr Bld: 6.2 % — ABNORMAL HIGH (ref 4.8–5.6)
Mean Plasma Glucose: 131.24 mg/dL

## 2023-11-02 MED ORDER — CIPROFLOXACIN IN D5W 400 MG/200ML IV SOLN
400.0000 mg | Freq: Two times a day (BID) | INTRAVENOUS | Status: DC
  Administered 2023-11-02: 400 mg via INTRAVENOUS
  Filled 2023-11-02: qty 200

## 2023-11-02 MED ORDER — INSULIN ASPART 100 UNIT/ML IJ SOLN: 0.0000 [IU] | Freq: Three times a day (TID) | INTRAMUSCULAR | Status: DC

## 2023-11-02 MED ORDER — POTASSIUM CHLORIDE CRYS ER 20 MEQ PO TBCR
30.0000 meq | EXTENDED_RELEASE_TABLET | ORAL | Status: AC
  Administered 2023-11-02 (×3): 30 meq via ORAL
  Filled 2023-11-02 (×3): qty 1

## 2023-11-02 MED ORDER — METRONIDAZOLE 500 MG/100ML IV SOLN
500.0000 mg | Freq: Two times a day (BID) | INTRAVENOUS | Status: DC
  Administered 2023-11-02: 500 mg via INTRAVENOUS
  Filled 2023-11-02: qty 100

## 2023-11-02 MED ORDER — LORAZEPAM 0.5 MG PO TABS
0.5000 mg | ORAL_TABLET | Freq: Every evening | ORAL | Status: DC | PRN
Start: 2023-11-02 — End: 2023-11-05
  Administered 2023-11-03 – 2023-11-04 (×3): 0.5 mg via ORAL
  Filled 2023-11-02 (×3): qty 1

## 2023-11-02 MED ORDER — HYDROCHLOROTHIAZIDE 25 MG PO TABS
25.0000 mg | ORAL_TABLET | Freq: Every morning | ORAL | Status: DC
  Administered 2023-11-02 – 2023-11-05 (×4): 25 mg via ORAL
  Filled 2023-11-02 (×4): qty 1

## 2023-11-02 MED ORDER — SODIUM CHLORIDE 0.9 % IV SOLN: INTRAVENOUS | Status: DC

## 2023-11-02 MED ORDER — PANTOPRAZOLE SODIUM 40 MG PO TBEC
40.0000 mg | DELAYED_RELEASE_TABLET | Freq: Every day | ORAL | Status: DC
  Administered 2023-11-02 – 2023-11-05 (×4): 40 mg via ORAL
  Filled 2023-11-02 (×4): qty 1

## 2023-11-02 NOTE — TOC Initial Note (Signed)
 Transition of Care [facility]) - Initial/Assessment Note   Patient Details  Name: Hannah Frazier MRN: [id number] Date of Birth: May 07, 1950  Transition of Care [facility]) CM/SW Contact:    Duwaine GORMAN Aran, LCSW Phone Number: 11/02/2023, 9:49 AM  Clinical Narrative: Patient is from home with family. Patient is currently requiring IV antibiotics. Care management following for discharge needs.  Expected Discharge Plan: Home/Self Care Barriers to Discharge: Continued Medical Work up  Expected Discharge Plan and Services In-house Referral: Clinical Social Work Living arrangements for the past 2 months: Single Family Home            DME Arranged: N/A DME Agency: NA  Prior Living Arrangements/Services Living arrangements for the past 2 months: Single Family Home Lives with:: Adult Children Patient language and need for interpreter reviewed:: Yes Do you feel safe going back to the place where you live?: Yes      Need for Family Participation in Patient Care: No (Comment) Care giver support system in place?: Yes (comment) Criminal Activity/Legal Involvement Pertinent to Current Situation/Hospitalization: No - Comment as needed  Activities of Daily Living ADL Screening (condition at time of admission) Independently performs ADLs?: Yes (appropriate for developmental age) Is the patient deaf or have difficulty hearing?: No Does the patient have difficulty seeing, even when wearing glasses/contacts?: No Does the patient have difficulty concentrating, remembering, or making decisions?: No  Emotional Assessment Orientation: : Oriented to Self, Oriented to Place, Oriented to  Time, Oriented to Situation Alcohol / Substance Use: Not Applicable Psych Involvement: No (comment)  Admission diagnosis:  Colitis [K52.9] Near syncope [R55] Patient Active Problem List   Diagnosis Date Noted   Generalized weakness 11/02/2023   Dehydration 11/02/2023   Gastroesophageal reflux disease 11/02/2023   Type 2  diabetes mellitus without complication, without long-term current use of insulin (HCC) 11/02/2023   Colitis 11/01/2023   Abnormal CT scan 06/18/2022   Coronary artery disease involving native coronary artery of native heart without angina pectoris 06/18/2022   Overweight (BMI 25.0-29.9) 12/24/2020   Hypercholesterolemia 12/24/2020   Dysphagia 12/24/2020   Difficulty sleeping 12/24/2020   Chronic superficial 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 [place] [address] Phone: [phone] Fax: [phone]  Social Drivers of Health (SDOH) Social History: SDOH Screenings   Food Insecurity: No Food Insecurity (11/01/2023)  Housing: Low Risk  (11/01/2023)  Transportation Needs: No Transportation Needs (11/01/2023)  Utilities: Not At Risk (11/01/2023)  Alcohol Screen: Low Risk  (07/22/2022)  Depression (PHQ2-9): Low Risk  (03/02/2023)  Financial Resource Strain: Low Risk  (02/26/2023)  Physical Activity: Inactive (02/26/2023)  Social Connections: Moderately Integrated (11/01/2023)  Stress: No Stress Concern Present (02/26/2023)  Tobacco Use: Low Risk  (11/01/2023)  Health Literacy: Adequate Health Literacy (10/29/2022)   SDOH Interventions:    Readmission Risk Interventions     No data to display

## 2023-11-02 NOTE — Progress Notes (Addendum)
 PROGRESS NOTE    Hannah Frazier  FMW:[id number] DOB: [birth year] DOA: 11/01/2023 PCP: Tanda Bleacher, MD    Brief Narrative:   Hannah Frazier is a 73 y.o. female with past medical history significant for HTN, DM2, anxiety/depression, B-cell lymphoma, osteoarthritis who presented to [facility] ED on 11/01/2023 via EMS from home generalized weakness, dizziness and diarrhea. Patient reports she had a colonoscopy on Wednesday and found a benign polyp. She has had poor appetite since Sunday and on Monday, she started having dizziness and chills. Today, she woke up with profuse nonbloody diarrhea and has been persistent all day. She reports feeling weak and has had decreased fluid intake in the setting of her fall appetite but denies any abdominal pain, nausea, vomiting, bloody stools, fever, chest pain, shortness of breath or syncope.  She denies any recent antibiotics use, drinking unfiltered or stream water or eating restaurant food.   In the ED, temperature 98.8-> 100.5 F, SBP 120-150s, SpO2 98% on room air. Initial labs significant for sodium 134, K+ 3.8, bicarb 21, glucose 157, normal renal function, bilirubin 1.3, WBC 11.2, negative flu, RSV and COVID test, negative C. difficile screen. EKG shows sinus rhythm with RBBB and occasional PVCs. CXR shows no active disease.  CT A/P shows evidence of colitis but no associated abscess or collection.  Pt received IV NS 1 L bolus, IV Zofran , IV Cipro  and IV Flagyl . TRH was consulted for admission.   Assessment & Plan:   Salmonella gastroenteritis Patient presenting from home with generalized weakness, dizziness and diarrhea.  Was noted to be orthostatic on EMS arrival.  Denies blood in her stool.  No recent antibiotic use.  Denies any sick contacts.  C. difficile PCR negative.  GI PCR panel positive for Salmonella.  Initially started on IV antibiotics with ciprofloxacin  and Flagyl , now discontinued to avoid further Salmonella shedding per IDSA  guidelines. -- NS at 100 mL/h -- Supportive care -- Enteric precautions -- Strict I's and O's, monitor bowel movement  Hypovolemic hyponatremia -- Na 134 -- continue IVF hydration -- BMP in am  Hypokalemia Potassium 2.9, will replete.  Etiology likely secondary to GI loss from Salmonella gastroenteritis -- BMP in am, w/ Mg, Phos  HTN -- Hydrochlorothiazide  25 mg p.o. daily  DM2 -- Moderate SSI for coverage -- CBG before every meal/at bedtime  Anxiety/depression -- Ativan  0.5 mg p.o. nightly as needed anxiety  History of B-cell lymphoma Continue outpatient follow-up with medical oncology, Dr. Lonn  DVT prophylaxis: enoxaparin (LOVENOX) injection 40 mg Start: 11/01/23 2000    Code Status: Limited: Do not attempt resuscitation (DNR) -DNR-LIMITED -Do Not Intubate/DNI  Family Communication:   Disposition Plan:  Level of care: Telemetry Status is: Inpatient Remains inpatient appropriate because: Needs improvement in diarrhea, increased oral intake    Consultants:  None  Procedures:  None  Antimicrobials:  Ciprofloxacin  10/7 - 10/7 Metronidazole  10/7 - 10/8   Subjective: Patient seen examined at bedside, lying in bed.  Continues with poor oral intake.  Remains on IV fluids.  Discussed positive GI PCR panel with Salmonella, will discontinue antibiotics to avoid further shedding of Salmonella per IDSA guidelines.  Does report less diarrhea.  No other questions or concerns at this time.  Denies headache, no dizziness, no chest pain, no palpitations, no shortness of breath, no abdominal pain, no current nausea/vomiting, no fever/chills/night sweats, no focal weakness, no fatigue, no paresthesia.  No acute events overnight per nurse staff.  Objective: Vitals:   11/02/23 9393 11/02/23 9352  11/02/23 0800 11/02/23 1310  BP: 136/62  126/64 121/65  Pulse: 81  75 65  Resp: 16   20  Temp: 100.2 F (37.9 C) 98.3 F (36.8 C) 98.3 F (36.8 C) 99.2 F (37.3 C)  TempSrc:  Oral Oral Oral Oral  SpO2: 94%  97% 99%  Weight:      Height:        Intake/Output Summary (Last 24 hours) at 11/02/2023 1807 Last data filed at 11/02/2023 1746 Gross per 24 hour  Intake 3008.73 ml  Output 900 ml  Net 2108.73 ml   Filed Weights   11/02/23 0202  Weight: 83.5 kg    Examination:  Physical Exam: GEN: NAD, alert and oriented x 3, ill in appearance HEENT: NCAT, PERRL, EOMI, sclera clear, MMM PULM: CTAB w/o wheezes/crackles, normal respiratory effort, on room air CV: RRR w/o M/G/R GI: abd soft, NTND, + BS MSK: no peripheral edema, moves all extremity independently NEURO: No focal neurological deficits PSYCH: normal mood/affect Integumentary: dry/intact, no rashes or wounds    Data Reviewed: I have personally reviewed following labs and imaging studies  CBC: Recent Labs  Lab 11/01/23 1404 11/02/23 0545  WBC 11.2* 6.9  NEUTROABS 9.8*  --   HGB 12.6 12.3  HCT 39.2 38.4  MCV 88.3 87.3  PLT 220 175   Basic Metabolic Panel: Recent Labs  Lab 11/01/23 1404 11/02/23 0545  NA 134* 134*  K 3.8 2.9*  CL 99 102  CO2 21* 22  GLUCOSE 157* 133*  BUN 11 8  CREATININE 0.90 0.69  CALCIUM 8.7* 8.2*  MG 2.0  --    GFR: Estimated Creatinine Clearance: 69.2 mL/min (by C-G formula based on SCr of 0.69 mg/dL). Liver Function Tests: Recent Labs  Lab 11/01/23 1404  AST 33  ALT 12  ALKPHOS 62  BILITOT 1.3*  PROT 7.1  ALBUMIN 3.9   Recent Labs  Lab 11/01/23 1404  LIPASE 21   No results for input(s): AMMONIA in the last 168 hours. Coagulation Profile: No results for input(s): INR, PROTIME in the last 168 hours. Cardiac Enzymes: No results for input(s): CKTOTAL, CKMB, CKMBINDEX, TROPONINI in the last 168 hours. BNP (last 3 results) No results for input(s): PROBNP in the last 8760 hours. HbA1C: Recent Labs    11/02/23 0545  HGBA1C 6.2*   CBG: Recent Labs  Lab 11/02/23 0830 11/02/23 1145 11/02/23 1636  GLUCAP 125* 134* 114*    Lipid Profile: No results for input(s): CHOL, HDL, LDLCALC, TRIG, CHOLHDL, LDLDIRECT in the last 72 hours. Thyroid  Function Tests: No results for input(s): TSH, T4TOTAL, FREET4, T3FREE, THYROIDAB in the last 72 hours. Anemia Panel: No results for input(s): VITAMINB12, FOLATE, FERRITIN, TIBC, IRON, RETICCTPCT in the last 72 hours. Sepsis Labs: No results for input(s): PROCALCITON, LATICACIDVEN in the last 168 hours.  Recent Results (from the past 240 hours)  Resp panel by RT-PCR (RSV, Flu A&B, Covid) Anterior Nasal Swab     Status: None   Collection Time: 11/01/23  1:38 PM   Specimen: Anterior Nasal Swab  Result Value Ref Range Status   SARS Coronavirus 2 by RT PCR NEGATIVE NEGATIVE Final    Comment: (NOTE) SARS-CoV-2 target nucleic acids are NOT DETECTED.  The SARS-CoV-2 RNA is generally detectable in upper respiratory specimens during the acute phase of infection. The lowest concentration of SARS-CoV-2 viral copies this assay can detect is 138 copies/mL. A negative result does not preclude SARS-Cov-2 infection and should not be used as the sole basis for  treatment or other patient management decisions. A negative result may occur with  improper specimen collection/handling, submission of specimen other than nasopharyngeal swab, presence of viral mutation(s) within the areas targeted by this assay, and inadequate number of viral copies(<138 copies/mL). A negative result must be combined with clinical observations, patient history, and epidemiological information. The expected result is Negative.  Fact Sheet for Patients:  BloggerCourse.com  Fact Sheet for Healthcare Providers:  SeriousBroker.it  This test is no t yet approved or cleared by the [place]  FDA and  has been authorized for detection and/or diagnosis of SARS-CoV-2 by FDA under an Emergency Use Authorization (EUA). This EUA  will remain  in effect (meaning this test can be used) for the duration of the COVID-19 declaration under Section 564(b)(1) of the Act, 21 U.S.C.section 360bbb-3(b)(1), unless the authorization is terminated  or revoked sooner.       Influenza A by PCR NEGATIVE NEGATIVE Final   Influenza B by PCR NEGATIVE NEGATIVE Final    Comment: (NOTE) The Xpert Xpress SARS-CoV-2/FLU/RSV plus assay is intended as an aid in the diagnosis of influenza from Nasopharyngeal swab specimens and should not be used as a sole basis for treatment. Nasal washings and aspirates are unacceptable for Xpert Xpress SARS-CoV-2/FLU/RSV testing.  Fact Sheet for Patients: BloggerCourse.com  Fact Sheet for Healthcare Providers: SeriousBroker.it  This test is not yet approved or cleared by the [place]  FDA and has been authorized for detection and/or diagnosis of SARS-CoV-2 by FDA under an Emergency Use Authorization (EUA). This EUA will remain in effect (meaning this test can be used) for the duration of the COVID-19 declaration under Section 564(b)(1) of the Act, 21 U.S.C. section 360bbb-3(b)(1), unless the authorization is terminated or revoked.     Resp Syncytial Virus by PCR NEGATIVE NEGATIVE Final    Comment: (NOTE) Fact Sheet for Patients: BloggerCourse.com  Fact Sheet for Healthcare Providers: SeriousBroker.it  This test is not yet approved or cleared by the [place]  FDA and has been authorized for detection and/or diagnosis of SARS-CoV-2 by FDA under an Emergency Use Authorization (EUA). This EUA will remain in effect (meaning this test can be used) for the duration of the COVID-19 declaration under Section 564(b)(1) of the Act, 21 U.S.C. section 360bbb-3(b)(1), unless the authorization is terminated or revoked.  Performed at [facility], [address] [place], [place] [address]   C Difficile Quick Screen w PCR reflex     Status: None   Collection Time: 11/01/23  5:13 PM   Specimen: STOOL  Result Value Ref Range Status   C Diff antigen NEGATIVE NEGATIVE Final   C Diff toxin NEGATIVE NEGATIVE Final   C Diff interpretation No C. difficile detected.  Final    Comment: Performed at [facility], [address], [place], [place] [address]  Gastrointestinal Panel by PCR , Stool     Status: Abnormal   Collection Time: 11/01/23  5:13 PM   Specimen: Stool  Result Value Ref Range Status   Campylobacter species NOT DETECTED NOT DETECTED Final   Plesimonas shigelloides NOT DETECTED NOT DETECTED Final   Salmonella species DETECTED (A) NOT DETECTED Final    Comment: RESULT CALLED TO, READ BACK BY AND VERIFIED WITH: TINNIE HOPKINS, RN AT 1010 ON 11/02/23 BY GM    Yersinia enterocolitica NOT DETECTED NOT DETECTED Final   Vibrio species NOT DETECTED NOT DETECTED Final   Vibrio cholerae NOT DETECTED NOT DETECTED Final   Enteroaggregative E coli (  EAEC) NOT DETECTED NOT DETECTED Final   Enteropathogenic E coli (EPEC) NOT DETECTED NOT DETECTED Final   Enterotoxigenic E coli (ETEC) NOT DETECTED NOT DETECTED Final   Shiga like toxin producing E coli (STEC) NOT DETECTED NOT DETECTED Final   Shigella/Enteroinvasive E coli (EIEC) NOT DETECTED NOT DETECTED Final   Cryptosporidium NOT DETECTED NOT DETECTED Final   Cyclospora cayetanensis NOT DETECTED NOT DETECTED Final   Entamoeba histolytica NOT DETECTED NOT DETECTED Final   Giardia lamblia NOT DETECTED NOT DETECTED Final   Adenovirus F40/41 NOT DETECTED NOT DETECTED Final   Astrovirus NOT DETECTED NOT DETECTED Final   Norovirus GI/GII NOT DETECTED NOT DETECTED Final   Rotavirus A NOT DETECTED NOT DETECTED Final   Sapovirus (I, II, IV, and V) NOT DETECTED NOT DETECTED Final    Comment: Performed at [facility], [address] [place], [place] [address]          Radiology Studies: CT ABDOMEN PELVIS W CONTRAST Result Date: 11/01/2023 CLINICAL DATA:  Diverticulitis, complication suspected. EXAM: CT ABDOMEN AND PELVIS WITH CONTRAST TECHNIQUE: Multidetector CT imaging of the abdomen and pelvis was performed using the standard protocol following bolus administration of intravenous contrast. RADIATION DOSE REDUCTION: This exam was performed according to the departmental dose-optimization program which includes automated exposure control, adjustment of the mA and/or kV according to patient size and/or use of iterative reconstruction technique. CONTRAST:  OMNIPAQUE  IOHEXOL  300 MG/ML  SOLN COMPARISON:  CT scan abdomen and pelvis from 08/12/2023. FINDINGS: Lower chest: There are subpleural atelectatic changes in the visualized lung bases. No overt consolidation. No pleural effusion. The heart is normal in size. No pericardial effusion. Hepatobiliary: The liver is normal in size. Non-cirrhotic configuration. No suspicious mass. These is mild diffuse hepatic steatosis. No intrahepatic or extrahepatic bile duct dilation. No calcified gallstones. Normal gallbladder wall thickness. No pericholecystic inflammatory changes. Pancreas: Unremarkable. No pancreatic ductal dilatation or surrounding inflammatory changes. Spleen: Within normal limits. No focal lesion. Adrenals/Urinary Tract: Adrenal glands are unremarkable. No suspicious renal mass. No hydronephrosis. No renal or ureteric calculi. Urinary bladder is under distended, precluding optimal assessment. However, no large mass or stones identified. No perivesical fat stranding. Stomach/Bowel: No disproportionate dilation of the small or large bowel loops. There is liquid unformed stool in the colon, nonspecific but commonly seen with diarrhea. Patient is status post partial right hemicolectomy with end-to-side ileocolonic anastomosis in the right upper quadrant. There is mild mucosal hyperenhancement of the sigmoid  colon/rectum with associated prominence of vasa recta concerning for colitis. Findings are most likely infective/inflammatory given the distribution pattern. No associated abscess or collection. No pneumatosis, portal venous gas or pneumoperitoneum. Vascular/Lymphatic: There is stable mildly enlarged right mid abdominal mesenteric lymph node (series 2, image 60). No new abdominal or pelvic lymphadenopathy, by size criteria. No aneurysmal dilation of the major abdominal arteries. There are mild peripheral atherosclerotic vascular calcifications of the aorta and its major branches. Reproductive: The uterus is surgically absent. No large adnexal mass. Other: There is a tiny fat containing umbilical hernia. The soft tissues and abdominal wall are otherwise unremarkable. Musculoskeletal: No suspicious osseous lesions. There are mild - moderate multilevel degenerative changes in the visualized spine. IMPRESSION: 1. Mild mucosal hyperenhancement of the sigmoid colon/rectum with associated prominence of vasa recta, concerning for colitis. No associated abscess or collection. 2. Multiple other nonacute observations, as described above. Aortic Atherosclerosis (ICD10-I70.0). Electronically Signed   By: Ree Molt M.D.   On: 11/01/2023 17:14   DG Chest Portable  1 View Result Date: 11/01/2023 CLINICAL DATA:  Dizziness and shortness of breath. Possible pneumonia. EXAM: PORTABLE CHEST 1 VIEW COMPARISON:  CT scan 03/03/2021 FINDINGS: The cardiac silhouette, mediastinal and hilar contours are within normal limits. Low lung volumes with vascular crowding and streaky atelectasis but no pulmonary infiltrates or pleural effusions. No pneumothorax. The bony thorax is intact. IMPRESSION: Low lung volumes with vascular crowding and streaky atelectasis. Electronically Signed   By: MYRTIS Stammer M.D.   On: 11/01/2023 14:48        Scheduled Meds:  enoxaparin (LOVENOX) injection  40 mg Subcutaneous Q24H   feeding supplement   237 mL Oral TID BM   hydrochlorothiazide   25 mg Oral q morning   insulin aspart  0-15 Units Subcutaneous TID WC   pantoprazole   40 mg Oral Daily   Continuous Infusions:  sodium chloride  150 mL/hr at 11/02/23 1747     LOS: 1 day    Time spent: 50 minutes spent on 11/02/2023 caring for this patient face-to-face including chart review, ordering labs/tests, documenting, discussion with nursing staff, consultants, updating family and interview/physical exam    Camellia PARAS [place], DO [facility] Available via Epic secure chat 7am-7pm After these hours, please refer to coverage provider listed on [email or web address] 11/02/2023, 6:07 PM

## 2023-11-03 DIAGNOSIS — K529 Noninfective gastroenteritis and colitis, unspecified: Secondary | ICD-10-CM | POA: Diagnosis not present

## 2023-11-03 LAB — BASIC METABOLIC PANEL WITH GFR
Anion gap: 9 (ref 5–15)
BUN: <5 mg/dL — ABNORMAL LOW (ref 8–23)
CO2: 24 mmol/L (ref 22–32)
Calcium: 8 mg/dL — ABNORMAL LOW (ref 8.9–10.3)
Chloride: 100 mmol/L (ref 98–111)
Creatinine, Ser: 0.54 mg/dL (ref 0.44–1.00)
GFR, Estimated: >60 mL/min (ref >60)
Glucose, Bld: 112 mg/dL — ABNORMAL HIGH (ref 70–99)
Potassium: 2.7 mmol/L — CL (ref 3.5–5.1)
Sodium: 133 mmol/L — ABNORMAL LOW (ref 135–145)

## 2023-11-03 LAB — GLUCOSE, CAPILLARY
Glucose-Capillary: 121 mg/dL — ABNORMAL HIGH (ref 70–99)
Glucose-Capillary: 104 mg/dL — ABNORMAL HIGH (ref 70–99)
Glucose-Capillary: 90 mg/dL (ref 70–99)
Glucose-Capillary: 85 mg/dL (ref 70–99)

## 2023-11-03 LAB — MAGNESIUM: Magnesium: 2 mg/dL (ref 1.7–2.4)

## 2023-11-03 LAB — PHOSPHORUS: Phosphorus: 1.4 mg/dL — ABNORMAL LOW (ref 2.5–4.6)

## 2023-11-03 MED ORDER — SODIUM PHOSPHATES 45 MMOLE/15ML IV SOLN
30.0000 mmol | Freq: Once | INTRAVENOUS | Status: AC
  Administered 2023-11-03: 30 mmol via INTRAVENOUS
  Filled 2023-11-03: qty 10

## 2023-11-03 MED ORDER — POTASSIUM CHLORIDE IN NACL 40-0.9 MEQ/L-% IV SOLN
INTRAVENOUS | Status: DC
  Filled 2023-11-03 (×3): qty 1000

## 2023-11-03 MED ORDER — ZINC OXIDE 40 % EX OINT
TOPICAL_OINTMENT | Freq: Four times a day (QID) | CUTANEOUS | Status: DC | PRN
  Filled 2023-11-03: qty 57

## 2023-11-03 MED ORDER — POTASSIUM CHLORIDE CRYS ER 20 MEQ PO TBCR
40.0000 meq | EXTENDED_RELEASE_TABLET | ORAL | Status: AC
  Administered 2023-11-03 (×2): 40 meq via ORAL
  Filled 2023-11-03: qty 2

## 2023-11-03 MED ORDER — POTASSIUM CHLORIDE CRYS ER 20 MEQ PO TBCR
40.0000 meq | EXTENDED_RELEASE_TABLET | ORAL | Status: AC
  Filled 2023-11-03: qty 2

## 2023-11-03 NOTE — Progress Notes (Signed)
 PT Cancellation Note  Patient Details Name: Hannah Frazier MRN: [id number] DOB: [birth year]   Cancelled Treatment:    Reason Eval/Treat Not Completed: Other (comment). Pt resting in bed, reports fatigued from medication and wanting to sleep it off. Pt independently rolling and repositioning in bed, reports from home with her son and plans to return home at d/c. Pt reports ind at baseline, drives. Pt open to PT returning for PT eval at later time. Will continue to follow for PT eval.     Tori Marissa Weaver PT, DPT 11/03/23, 11:40 AM

## 2023-11-03 NOTE — Evaluation (Signed)
 Physical Therapy Evaluation Only Patient Details Name: Hannah Frazier MRN: [id number] DOB: December 29, 1950 Today's Date: 11/03/2023  History of Present Illness  Hannah Frazier is a 73 y.o. female presenting to ED with generalized weakness; GI PCR panel positive for Salmonella. PMH:  T2DM, B-cell lymphoma, HTN, anxiety and depression, arthritis and a recent colonoscopy  Clinical Impression  Pt reports from home with son, single level home with 5 steps to enter the home, at baseline pt is ind without AD, reports ind with self care/household chores, drives, volunteers at church, denies falls. On eval, pt mobilizing around room modified ind-supv. Therapist educated pt on log roll with supine<>sit for chronic back pain complaints, no physical assistance needed. Pt amb to sink and brushes teeth independently then amb around room and back to supine. Pt politely declines amb out into hallway due to bowel urgency; reports will amb with nursing when bowel urgency stops. No acute PT needs identified and anticipate no f/u PT needs, pt in agreement with this plan and reports she will mobilize with nursing as able/desired.      If plan is discharge home, recommend the following: Assistance with cooking/housework;Assist for transportation;Help with stairs or ramp for entrance   Can travel by private vehicle        Equipment Recommendations None recommended by PT  Recommendations for Other Services       Functional Status Assessment Patient has had a recent decline in their functional status and demonstrates the ability to make significant improvements in function in a reasonable and predictable amount of time.     Precautions / Restrictions Precautions Precaution/Restrictions Comments: contact precautions Restrictions Weight Bearing Restrictions Per Provider Order: No      Mobility  Bed Mobility Overal bed mobility: Modified Independent             General bed mobility comments:  supine<>sit and rolling, slight increased time, cues for log roll for chronic back pain complaints    Transfers Overall transfer level: Modified independent Equipment used: None               General transfer comment: STS from bedside, no AD, slow to power up    Ambulation/Gait Ambulation/Gait assistance: Supervision Gait Distance (Feet): 12 Feet Assistive device: None Gait Pattern/deviations: Step-to pattern, Decreased stride length       General Gait Details: slow, short steps, somewhat cautious, no LE Buckling noted, reports stiffness in back limiting, able to amb in room wtihout AD, declines amb out into hallway due to bowel urgency  Stairs            Wheelchair Mobility     Tilt Bed    Modified Rankin (Stroke Patients Only)       Balance Overall balance assessment: No apparent balance deficits (not formally assessed)                                           Pertinent Vitals/Pain Pain Assessment Pain Assessment: Faces Faces Pain Scale: Hurts a little bit Pain Location: low back Pain Descriptors / Indicators:  (stiff) Pain Intervention(s): Limited activity within patient's tolerance, Monitored during session, Repositioned    Home Living Family/patient expects to be discharged to:: Private residence Living Arrangements: Children;Other relatives (my son and his dad) Available Help at Discharge: Family Type of Home: House Home Access: Stairs to enter Entrance Stairs-Rails: Right;Left;Can reach both Secretary/administrator  of Steps: 5   Home Layout: One level Home Equipment: Cane - single point      Prior Function Prior Level of Function : Independent/Modified Independent;Driving             Mobility Comments: pt reports ind with amb, denies falls in last 6 months, drives, volunteer at church ADLs Comments: pt reports ind with ADLs/IADLs     Extremity/Trunk Assessment   Upper Extremity Assessment Upper Extremity  Assessment: Overall WFL for tasks assessed    Lower Extremity Assessment Lower Extremity Assessment: Overall WFL for tasks assessed    Cervical / Trunk Assessment Cervical / Trunk Assessment: Normal  Communication   Communication Communication: No apparent difficulties    Cognition Arousal: Alert Behavior During Therapy: WFL for tasks assessed/performed   PT - Cognitive impairments: No apparent impairments                         Following commands: Intact       Cueing       General Comments      Exercises     Assessment/Plan    PT Assessment Patient does not need any further PT services  PT Problem List         PT Treatment Interventions      PT Goals (Current goals can be found in the Care Plan section)  Acute Rehab PT Goals Patient Stated Goal: return home, no PT PT Goal Formulation: All assessment and education complete, DC therapy    Frequency       Co-evaluation               AM-PAC PT 6 Clicks Mobility  Outcome Measure Help needed turning from your back to your side while in a flat bed without using bedrails?: None Help needed moving from lying on your back to sitting on the side of a flat bed without using bedrails?: None Help needed moving to and from a bed to a chair (including a wheelchair)?: None Help needed standing up from a chair using your arms (e.g., wheelchair or bedside chair)?: None Help needed to walk in hospital room?: A Little Help needed climbing 3-5 steps with a railing? : A Little 6 Click Score: 22    End of Session   Activity Tolerance: Patient tolerated treatment well Patient left: in bed;with call bell/phone within reach;with bed alarm set Nurse Communication: Mobility status PT Visit Diagnosis: Muscle weakness (generalized) (M62.81)    Time: 8478-8454 PT Time Calculation (min) (ACUTE ONLY): 24 min   Charges:   PT Evaluation $PT Eval Low Complexity: 1 Low   PT General Charges $$ ACUTE PT  VISIT: 1 Visit         Tori Aksel Bencomo PT, DPT 11/03/23, 3:54 PM

## 2023-11-03 NOTE — Progress Notes (Signed)
 PROGRESS NOTE    Hannah Frazier  FMW:[id number] DOB: 23-Apr-1950 DOA: 11/01/2023 PCP: Tanda Bleacher, MD    Brief Narrative:   Hannah Frazier is a 73 y.o. female with past medical history significant for HTN, DM2, anxiety/depression, B-cell lymphoma, osteoarthritis who presented to [facility] ED on 11/01/2023 via EMS from home generalized weakness, dizziness and diarrhea. Patient reports she had a colonoscopy on Wednesday and found a benign polyp. She has had poor appetite since Sunday and on Monday, she started having dizziness and chills. Today, she woke up with profuse nonbloody diarrhea and has been persistent all day. She reports feeling weak and has had decreased fluid intake in the setting of her fall appetite but denies any abdominal pain, nausea, vomiting, bloody stools, fever, chest pain, shortness of breath or syncope.  She denies any recent antibiotics use, drinking unfiltered or stream water or eating restaurant food.   In the ED, temperature 98.8-> 100.5 F, SBP 120-150s, SpO2 98% on room air. Initial labs significant for sodium 134, K+ 3.8, bicarb 21, glucose 157, normal renal function, bilirubin 1.3, WBC 11.2, negative flu, RSV and COVID test, negative C. difficile screen. EKG shows sinus rhythm with RBBB and occasional PVCs. CXR shows no active disease.  CT A/P shows evidence of colitis but no associated abscess or collection.  Pt received IV NS 1 L bolus, IV Zofran , IV Cipro  and IV Flagyl . TRH was consulted for admission.   Assessment & Plan:   Salmonella gastroenteritis Patient presenting from home with generalized weakness, dizziness and diarrhea.  Was noted to be orthostatic on EMS arrival.  Denies blood in her stool.  No recent antibiotic use.  Denies any sick contacts.  C. difficile PCR negative.  GI PCR panel positive for Salmonella.  Initially started on IV antibiotics with ciprofloxacin  and Flagyl , now discontinued to avoid further Salmonella shedding per IDSA  guidelines. -- NS w/ 40 mEq KCl at 100 mL/h -- Supportive care -- Enteric precautions -- Strict I's and O's, monitor bowel movement  Hypovolemic hyponatremia -- Na 134>133 -- continue IVF hydration -- BMP in am  Hypokalemia Hypophosphatemia Potassium 2.7, phosphorus 1.4 will replete.  Etiology likely secondary to GI loss from Salmonella gastroenteritis -- BMP in am, w/ Mg, Phos  HTN -- Hydrochlorothiazide  25 mg p.o. daily  DM2 -- Moderate SSI for coverage -- CBG before every meal/at bedtime  Anxiety/depression -- Ativan  0.5 mg p.o. nightly as needed anxiety  History of B-cell lymphoma Continue outpatient follow-up with medical oncology, Dr. Lonn  DVT prophylaxis: enoxaparin (LOVENOX) injection 40 mg Start: 11/01/23 2000    Code Status: Limited: Do not attempt resuscitation (DNR) -DNR-LIMITED -Do Not Intubate/DNI  Family Communication:   Disposition Plan:  Level of care: Telemetry Status is: Inpatient Remains inpatient appropriate because: Needs improvement in diarrhea, increased oral intake    Consultants:  None  Procedures:  None  Antimicrobials:  Ciprofloxacin  10/7 - 10/7 Metronidazole  10/7 - 10/8   Subjective: Patient seen examined at bedside, lying in bed.  Patient reports good water intake but otherwise has very poor appetite.  Remains on IV fluids.  Reports overall symptoms improved with less diarrhea.  Continues with significant electrolyte derangements, replacing again today.  No other questions or concerns at this time.  Denies headache, no dizziness, no chest pain, no palpitations, no shortness of breath, no abdominal pain, no current nausea/vomiting, no fever/chills/night sweats, no focal weakness, no fatigue, no paresthesia.  No acute events overnight per nurse staff.  Objective: Vitals:  11/02/23 1310 11/02/23 2139 11/03/23 0601 11/03/23 0801  BP: 121/65 126/61 (!) 111/53   Pulse: 65 69 73   Resp: 20 16 18    Temp: 99.2 F (37.3 C) 99.1 F  (37.3 C) 98.8 F (37.1 C)   TempSrc: Oral Oral    SpO2: 99% 98% 98% 94%  Weight:      Height:        Intake/Output Summary (Last 24 hours) at 11/03/2023 1310 Last data filed at 11/03/2023 1045 Gross per 24 hour  Intake 2699.75 ml  Output 1100 ml  Net 1599.75 ml   Filed Weights   11/02/23 0202  Weight: 83.5 kg    Examination:  Physical Exam: GEN: NAD, alert and oriented x 3, ill in appearance HEENT: NCAT, PERRL, EOMI, sclera clear, MMM PULM: CTAB w/o wheezes/crackles, normal respiratory effort, on room air CV: RRR w/o M/G/R GI: abd soft, NTND, + BS MSK: no peripheral edema, moves all extremity independently NEURO: No focal neurological deficits PSYCH: normal mood/affect Integumentary: dry/intact, no rashes or wounds    Data Reviewed: I have personally reviewed following labs and imaging studies  CBC: Recent Labs  Lab 11/01/23 1404 11/02/23 0545  WBC 11.2* 6.9  NEUTROABS 9.8*  --   HGB 12.6 12.3  HCT 39.2 38.4  MCV 88.3 87.3  PLT 220 175   Basic Metabolic Panel: Recent Labs  Lab 11/01/23 1404 11/02/23 0545 11/03/23 0535  NA 134* 134* 133*  K 3.8 2.9* 2.7*  CL 99 102 100  CO2 21* 22 24  GLUCOSE 157* 133* 112*  BUN 11 8 <5*  CREATININE 0.90 0.69 0.54  CALCIUM 8.7* 8.2* 8.0*  MG 2.0  --  2.0  PHOS  --   --  1.4*   GFR: Estimated Creatinine Clearance: 69.2 mL/min (by C-G formula based on SCr of 0.54 mg/dL). Liver Function Tests: Recent Labs  Lab 11/01/23 1404  AST 33  ALT 12  ALKPHOS 62  BILITOT 1.3*  PROT 7.1  ALBUMIN 3.9   Recent Labs  Lab 11/01/23 1404  LIPASE 21   No results for input(s): AMMONIA in the last 168 hours. Coagulation Profile: No results for input(s): INR, PROTIME in the last 168 hours. Cardiac Enzymes: No results for input(s): CKTOTAL, CKMB, CKMBINDEX, TROPONINI in the last 168 hours. BNP (last 3 results) No results for input(s): PROBNP in the last 8760 hours. HbA1C: Recent Labs    11/02/23 0545   HGBA1C 6.2*   CBG: Recent Labs  Lab 11/02/23 1145 11/02/23 1636 11/02/23 2135 11/03/23 0753 11/03/23 1158  GLUCAP 134* 114* 111* 121* 104*   Lipid Profile: No results for input(s): CHOL, HDL, LDLCALC, TRIG, CHOLHDL, LDLDIRECT in the last 72 hours. Thyroid  Function Tests: No results for input(s): TSH, T4TOTAL, FREET4, T3FREE, THYROIDAB in the last 72 hours. Anemia Panel: No results for input(s): VITAMINB12, FOLATE, FERRITIN, TIBC, IRON, RETICCTPCT in the last 72 hours. Sepsis Labs: No results for input(s): PROCALCITON, LATICACIDVEN in the last 168 hours.  Recent Results (from the past 240 hours)  Resp panel by RT-PCR (RSV, Flu A&B, Covid) Anterior Nasal Swab     Status: None   Collection Time: 11/01/23  1:38 PM   Specimen: Anterior Nasal Swab  Result Value Ref Range Status   SARS Coronavirus 2 by RT PCR NEGATIVE NEGATIVE Final    Comment: (NOTE) SARS-CoV-2 target nucleic acids are NOT DETECTED.  The SARS-CoV-2 RNA is generally detectable in upper respiratory specimens during the acute phase of infection. The lowest concentration of  SARS-CoV-2 viral copies this assay can detect is 138 copies/mL. A negative result does not preclude SARS-Cov-2 infection and should not be used as the sole basis for treatment or other patient management decisions. A negative result may occur with  improper specimen collection/handling, submission of specimen other than nasopharyngeal swab, presence of viral mutation(s) within the areas targeted by this assay, and inadequate number of viral copies(<138 copies/mL). A negative result must be combined with clinical observations, patient history, and epidemiological information. The expected result is Negative.  Fact Sheet for Patients:  BloggerCourse.com  Fact Sheet for Healthcare Providers:  SeriousBroker.it  This test is no t yet approved or cleared by  the [place]  FDA and  has been authorized for detection and/or diagnosis of SARS-CoV-2 by FDA under an Emergency Use Authorization (EUA). This EUA will remain  in effect (meaning this test can be used) for the duration of the COVID-19 declaration under Section 564(b)(1) of the Act, 21 U.S.C.section 360bbb-3(b)(1), unless the authorization is terminated  or revoked sooner.       Influenza A by PCR NEGATIVE NEGATIVE Final   Influenza B by PCR NEGATIVE NEGATIVE Final    Comment: (NOTE) The Xpert Xpress SARS-CoV-2/FLU/RSV plus assay is intended as an aid in the diagnosis of influenza from Nasopharyngeal swab specimens and should not be used as a sole basis for treatment. Nasal washings and aspirates are unacceptable for Xpert Xpress SARS-CoV-2/FLU/RSV testing.  Fact Sheet for Patients: BloggerCourse.com  Fact Sheet for Healthcare Providers: SeriousBroker.it  This test is not yet approved or cleared by the [place]  FDA and has been authorized for detection and/or diagnosis of SARS-CoV-2 by FDA under an Emergency Use Authorization (EUA). This EUA will remain in effect (meaning this test can be used) for the duration of the COVID-19 declaration under Section 564(b)(1) of the Act, 21 U.S.C. section 360bbb-3(b)(1), unless the authorization is terminated or revoked.     Resp Syncytial Virus by PCR NEGATIVE NEGATIVE Final    Comment: (NOTE) Fact Sheet for Patients: BloggerCourse.com  Fact Sheet for Healthcare Providers: SeriousBroker.it  This test is not yet approved or cleared by the [place]  FDA and has been authorized for detection and/or diagnosis of SARS-CoV-2 by FDA under an Emergency Use Authorization (EUA). This EUA will remain in effect (meaning this test can be used) for the duration of the COVID-19 declaration under Section 564(b)(1) of the Act, 21  U.S.C. section 360bbb-3(b)(1), unless the authorization is terminated or revoked.  Performed at [facility], [address] [place], [place] [address]   C Difficile Quick Screen w PCR reflex     Status: None   Collection Time: 11/01/23  5:13 PM   Specimen: STOOL  Result Value Ref Range Status   C Diff antigen NEGATIVE NEGATIVE Final   C Diff toxin NEGATIVE NEGATIVE Final   C Diff interpretation No C. difficile detected.  Final    Comment: Performed at [facility], [address] [place], [place] [address]  Gastrointestinal Panel by PCR , Stool     Status: Abnormal   Collection Time: 11/01/23  5:13 PM   Specimen: Stool  Result Value Ref Range Status   Campylobacter species NOT DETECTED NOT DETECTED Final   Plesimonas shigelloides NOT DETECTED NOT DETECTED Final   Salmonella species DETECTED (A) NOT DETECTED Final    Comment: RESULT CALLED TO, READ BACK BY AND VERIFIED WITH: TINNIE HOPKINS, RN AT 1010 ON 11/02/23 BY GM    Yersinia enterocolitica  NOT DETECTED NOT DETECTED Final   Vibrio species NOT DETECTED NOT DETECTED Final   Vibrio cholerae NOT DETECTED NOT DETECTED Final   Enteroaggregative E coli (EAEC) NOT DETECTED NOT DETECTED Final   Enteropathogenic E coli (EPEC) NOT DETECTED NOT DETECTED Final   Enterotoxigenic E coli (ETEC) NOT DETECTED NOT DETECTED Final   Shiga like toxin producing E coli (STEC) NOT DETECTED NOT DETECTED Final   Shigella/Enteroinvasive E coli (EIEC) NOT DETECTED NOT DETECTED Final   Cryptosporidium NOT DETECTED NOT DETECTED Final   Cyclospora cayetanensis NOT DETECTED NOT DETECTED Final   Entamoeba histolytica NOT DETECTED NOT DETECTED Final   Giardia lamblia NOT DETECTED NOT DETECTED Final   Adenovirus F40/41 NOT DETECTED NOT DETECTED Final   Astrovirus NOT DETECTED NOT DETECTED Final   Norovirus GI/GII NOT DETECTED NOT DETECTED Final   Rotavirus A NOT DETECTED NOT DETECTED Final   Sapovirus (I, II, IV, and  V) NOT DETECTED NOT DETECTED Final    Comment: Performed at [facility], [address], [place], [place] [address]         Radiology Studies: CT ABDOMEN PELVIS W CONTRAST Result Date: 11/01/2023 CLINICAL DATA:  Diverticulitis, complication suspected. EXAM: CT ABDOMEN AND PELVIS WITH CONTRAST TECHNIQUE: Multidetector CT imaging of the abdomen and pelvis was performed using the standard protocol following bolus administration of intravenous contrast. RADIATION DOSE REDUCTION: This exam was performed according to the departmental dose-optimization program which includes automated exposure control, adjustment of the mA and/or kV according to patient size and/or use of iterative reconstruction technique. CONTRAST:  OMNIPAQUE  IOHEXOL  300 MG/ML  SOLN COMPARISON:  CT scan abdomen and pelvis from 08/12/2023. FINDINGS: Lower chest: There are subpleural atelectatic changes in the visualized lung bases. No overt consolidation. No pleural effusion. The heart is normal in size. No pericardial effusion. Hepatobiliary: The liver is normal in size. Non-cirrhotic configuration. No suspicious mass. These is mild diffuse hepatic steatosis. No intrahepatic or extrahepatic bile duct dilation. No calcified gallstones. Normal gallbladder wall thickness. No pericholecystic inflammatory changes. Pancreas: Unremarkable. No pancreatic ductal dilatation or surrounding inflammatory changes. Spleen: Within normal limits. No focal lesion. Adrenals/Urinary Tract: Adrenal glands are unremarkable. No suspicious renal mass. No hydronephrosis. No renal or ureteric calculi. Urinary bladder is under distended, precluding optimal assessment. However, no large mass or stones identified. No perivesical fat stranding. Stomach/Bowel: No disproportionate dilation of the small or large bowel loops. There is liquid unformed stool in the colon, nonspecific but commonly seen with diarrhea. Patient is status post partial right  hemicolectomy with end-to-side ileocolonic anastomosis in the right upper quadrant. There is mild mucosal hyperenhancement of the sigmoid colon/rectum with associated prominence of vasa recta concerning for colitis. Findings are most likely infective/inflammatory given the distribution pattern. No associated abscess or collection. No pneumatosis, portal venous gas or pneumoperitoneum. Vascular/Lymphatic: There is stable mildly enlarged right mid abdominal mesenteric lymph node (series 2, image 60). No new abdominal or pelvic lymphadenopathy, by size criteria. No aneurysmal dilation of the major abdominal arteries. There are mild peripheral atherosclerotic vascular calcifications of the aorta and its major branches. Reproductive: The uterus is surgically absent. No large adnexal mass. Other: There is a tiny fat containing umbilical hernia. The soft tissues and abdominal wall are otherwise unremarkable. Musculoskeletal: No suspicious osseous lesions. There are mild - moderate multilevel degenerative changes in the visualized spine. IMPRESSION: 1. Mild mucosal hyperenhancement of the sigmoid colon/rectum with associated prominence of vasa recta, concerning for colitis. No associated abscess or collection. 2. Multiple  other nonacute observations, as described above. Aortic Atherosclerosis (ICD10-I70.0). Electronically Signed   By: Ree Molt M.D.   On: 11/01/2023 17:14   DG Chest Portable 1 View Result Date: 11/01/2023 CLINICAL DATA:  Dizziness and shortness of breath. Possible pneumonia. EXAM: PORTABLE CHEST 1 VIEW COMPARISON:  CT scan 03/03/2021 FINDINGS: The cardiac silhouette, mediastinal and hilar contours are within normal limits. Low lung volumes with vascular crowding and streaky atelectasis but no pulmonary infiltrates or pleural effusions. No pneumothorax. The bony thorax is intact. IMPRESSION: Low lung volumes with vascular crowding and streaky atelectasis. Electronically Signed   By: MYRTIS Stammer  M.D.   On: 11/01/2023 14:48        Scheduled Meds:  enoxaparin (LOVENOX) injection  40 mg Subcutaneous Q24H   feeding supplement  237 mL Oral TID BM   hydrochlorothiazide   25 mg Oral q morning   insulin aspart  0-15 Units Subcutaneous TID WC   pantoprazole   40 mg Oral Daily   Continuous Infusions:  0.9 % NaCl with KCl 40 mEq / L 100 mL/hr at 11/03/23 0759   sodium PHOSPHATE IVPB (in mmol) 30 mmol (11/03/23 0843)     LOS: 2 days    Time spent: 50 minutes spent on 11/03/2023 caring for this patient face-to-face including chart review, ordering labs/tests, documenting, discussion with nursing staff, consultants, updating family and interview/physical exam    Camellia PARAS [place], DO [facility] Available via Epic secure chat 7am-7pm After these hours, please refer to coverage provider listed on [email or web address] 11/03/2023, 1:10 PM

## 2023-11-04 DIAGNOSIS — K529 Noninfective gastroenteritis and colitis, unspecified: Secondary | ICD-10-CM | POA: Diagnosis not present

## 2023-11-04 LAB — BASIC METABOLIC PANEL WITH GFR
Anion gap: 8 (ref 5–15)
BUN: <5 mg/dL — ABNORMAL LOW (ref 8–23)
CO2: 27 mmol/L (ref 22–32)
Calcium: 8.2 mg/dL — ABNORMAL LOW (ref 8.9–10.3)
Chloride: 102 mmol/L (ref 98–111)
Creatinine, Ser: 0.65 mg/dL (ref 0.44–1.00)
GFR, Estimated: >60 mL/min (ref >60)
Glucose, Bld: 120 mg/dL — ABNORMAL HIGH (ref 70–99)
Potassium: 3.2 mmol/L — ABNORMAL LOW (ref 3.5–5.1)
Sodium: 137 mmol/L (ref 135–145)

## 2023-11-04 LAB — PHOSPHORUS: Phosphorus: 1.4 mg/dL — ABNORMAL LOW (ref 2.5–4.6)

## 2023-11-04 LAB — MAGNESIUM: Magnesium: 2.1 mg/dL (ref 1.7–2.4)

## 2023-11-04 LAB — GLUCOSE, CAPILLARY
Glucose-Capillary: 91 mg/dL (ref 70–99)
Glucose-Capillary: 121 mg/dL — ABNORMAL HIGH (ref 70–99)
Glucose-Capillary: 114 mg/dL — ABNORMAL HIGH (ref 70–99)
Glucose-Capillary: 134 mg/dL — ABNORMAL HIGH (ref 70–99)

## 2023-11-04 MED ORDER — SODIUM PHOSPHATES 45 MMOLE/15ML IV SOLN
30.0000 mmol | Freq: Once | INTRAVENOUS | Status: AC
  Administered 2023-11-04: 30 mmol via INTRAVENOUS
  Filled 2023-11-04: qty 10

## 2023-11-04 MED ORDER — POTASSIUM CHLORIDE ER 10 MEQ PO TBCR
40.0000 meq | EXTENDED_RELEASE_TABLET | ORAL | Status: AC
  Administered 2023-11-04 (×2): 40 meq via ORAL
  Filled 2023-11-04 (×2): qty 4

## 2023-11-04 MED ORDER — POTASSIUM CHLORIDE IN NACL 40-0.9 MEQ/L-% IV SOLN
INTRAVENOUS | Status: AC
  Administered 2023-11-04: 100 mL/h via INTRAVENOUS
  Filled 2023-11-04 (×2): qty 1000

## 2023-11-04 NOTE — Progress Notes (Signed)
 Mobility Specialist - Progress Note  Pre-mobility: 84 BPM HR During mobility: 98 BPM HR Post-mobility: 85 BPM HR   11/04/23 1008  Mobility  Activity Ambulated with assistance  Level of Assistance Contact guard assist, steadying assist  Assistive Device Front wheel walker  Distance Ambulated (ft) 140 ft  Range of Motion/Exercises Active  Activity Response Tolerated well  Mobility Referral Yes  Mobility visit 1 Mobility  Mobility Specialist Start Time (ACUTE ONLY) 0940  Mobility Specialist Stop Time (ACUTE ONLY) 1000  Mobility Specialist Time Calculation (min) (ACUTE ONLY) 20 min   Pt was received in bed and agreed to mobility. 1x small standing break during ambulation. Returned back to bed with all needs met. Call bell in reach.  [employer] - Mobility Specialist

## 2023-11-04 NOTE — Progress Notes (Signed)
 PROGRESS NOTE    Hannah Frazier  FMW:[id number] DOB: 01-10-1951 DOA: 11/01/2023 PCP: Tanda Bleacher, MD    Brief Narrative:   Hannah Frazier is a 73 y.o. female with past medical history significant for HTN, DM2, anxiety/depression, B-cell lymphoma, osteoarthritis who presented to [facility] ED on 11/01/2023 via EMS from home generalized weakness, dizziness and diarrhea. Patient reports she had a colonoscopy on Wednesday and found a benign polyp. She has had poor appetite since Sunday and on Monday, she started having dizziness and chills. Today, she woke up with profuse nonbloody diarrhea and has been persistent all day. She reports feeling weak and has had decreased fluid intake in the setting of her fall appetite but denies any abdominal pain, nausea, vomiting, bloody stools, fever, chest pain, shortness of breath or syncope.  She denies any recent antibiotics use, drinking unfiltered or stream water or eating restaurant food.   In the ED, temperature 98.8-> 100.5 F, SBP 120-150s, SpO2 98% on room air. Initial labs significant for sodium 134, K+ 3.8, bicarb 21, glucose 157, normal renal function, bilirubin 1.3, WBC 11.2, negative flu, RSV and COVID test, negative C. difficile screen. EKG shows sinus rhythm with RBBB and occasional PVCs. CXR shows no active disease.  CT A/P shows evidence of colitis but no associated abscess or collection.  Pt received IV NS 1 L bolus, IV Zofran , IV Cipro  and IV Flagyl . TRH was consulted for admission.   Assessment & Plan:   Salmonella gastroenteritis Patient presenting from home with generalized weakness, dizziness and diarrhea.  Was noted to be orthostatic on EMS arrival.  Denies blood in her stool.  No recent antibiotic use.  Denies any sick contacts.  C. difficile PCR negative.  GI PCR panel positive for Salmonella.  Initially started on IV antibiotics with ciprofloxacin  and Flagyl , now discontinued to avoid further Salmonella shedding per IDSA  guidelines. -- NS w/ 40 mEq KCl at 100 mL/h -- Supportive care -- Enteric precautions -- Strict I's and O's, monitor bowel movement  Hypovolemic hyponatremia -- Na 865>866>862 -- continue IVF hydration -- BMP in am  Hypokalemia Hypophosphatemia Potassium 3.2, phosphorus 1.4 will replete.  Etiology likely secondary to GI loss from Salmonella gastroenteritis -- BMP in am, w/ Mg, Phos  HTN -- Hydrochlorothiazide  25 mg p.o. daily  DM2 -- Moderate SSI for coverage -- CBG before every meal/at bedtime  Anxiety/depression -- Ativan  0.5 mg p.o. nightly as needed anxiety  History of B-cell lymphoma Continue outpatient follow-up with medical oncology, Dr. Lonn  DVT prophylaxis: enoxaparin (LOVENOX) injection 40 mg Start: 11/01/23 2000    Code Status: Limited: Do not attempt resuscitation (DNR) -DNR-LIMITED -Do Not Intubate/DNI  Family Communication:   Disposition Plan:  Level of care: Telemetry Status is: Inpatient Remains inpatient appropriate because: Needs improvement in diarrhea, increased oral intake    Consultants:  None  Procedures:  None  Antimicrobials:  Ciprofloxacin  10/7 - 10/7 Metronidazole  10/7 - 10/8   Subjective: Patient seen examined at bedside, sitting at edge of bed with mobility tech and RN present.  Reports appetite improving, less diarrhea.  Overall feels improved.  Continues with electrolyte disturbances and will continue replacement today.  Discussed with patient if she does well anticipate discharge home likely tomorrow.  Awaiting PT evaluation as well.    Denies headache, no dizziness, no chest pain, no palpitations, no shortness of breath, no abdominal pain, no current nausea/vomiting, no fever/chills/night sweats, no focal weakness, no fatigue, no paresthesia.  No acute events overnight per  nurse staff.  Objective: Vitals:   11/03/23 0801 11/03/23 1321 11/03/23 2128 11/04/23 0515  BP:  (!) 114/57 121/65 125/66  Pulse:  63 70 65  Resp:  18  18 18   Temp:  98.2 F (36.8 C) 98.1 F (36.7 C) 98.4 F (36.9 C)  TempSrc:  Oral    SpO2: 94% 95% 94% 92%  Weight:      Height:        Intake/Output Summary (Last 24 hours) at 11/04/2023 1237 Last data filed at 11/04/2023 1200 Gross per 24 hour  Intake 3175.71 ml  Output 1100 ml  Net 2075.71 ml   Filed Weights   11/02/23 0202  Weight: 83.5 kg    Examination:  Physical Exam: GEN: NAD, alert and oriented x 3, wd/wn HEENT: NCAT, PERRL, EOMI, sclera clear, MMM PULM: CTAB w/o wheezes/crackles, normal respiratory effort, on room air CV: RRR w/o M/G/R GI: abd soft, NTND, + BS MSK: no peripheral edema, moves all extremity independently NEURO: No focal neurological deficits PSYCH: normal mood/affect Integumentary: dry/intact, no rashes or wounds    Data Reviewed: I have personally reviewed following labs and imaging studies  CBC: Recent Labs  Lab 11/01/23 1404 11/02/23 0545  WBC 11.2* 6.9  NEUTROABS 9.8*  --   HGB 12.6 12.3  HCT 39.2 38.4  MCV 88.3 87.3  PLT 220 175   Basic Metabolic Panel: Recent Labs  Lab 11/01/23 1404 11/02/23 0545 11/03/23 0535 11/04/23 0509  NA 134* 134* 133* 137  K 3.8 2.9* 2.7* 3.2*  CL 99 102 100 102  CO2 21* 22 24 27   GLUCOSE 157* 133* 112* 120*  BUN 11 8 <5* <5*  CREATININE 0.90 0.69 0.54 0.65  CALCIUM 8.7* 8.2* 8.0* 8.2*  MG 2.0  --  2.0 2.1  PHOS  --   --  1.4* 1.4*   GFR: Estimated Creatinine Clearance: 69.2 mL/min (by C-G formula based on SCr of 0.65 mg/dL). Liver Function Tests: Recent Labs  Lab 11/01/23 1404  AST 33  ALT 12  ALKPHOS 62  BILITOT 1.3*  PROT 7.1  ALBUMIN 3.9   Recent Labs  Lab 11/01/23 1404  LIPASE 21   No results for input(s): AMMONIA in the last 168 hours. Coagulation Profile: No results for input(s): INR, PROTIME in the last 168 hours. Cardiac Enzymes: No results for input(s): CKTOTAL, CKMB, CKMBINDEX, TROPONINI in the last 168 hours. BNP (last 3 results) No results for  input(s): PROBNP in the last 8760 hours. HbA1C: Recent Labs    11/02/23 0545  HGBA1C 6.2*   CBG: Recent Labs  Lab 11/03/23 1158 11/03/23 1613 11/03/23 2129 11/04/23 0752 11/04/23 1141  GLUCAP 104* 90 85 91 121*   Lipid Profile: No results for input(s): CHOL, HDL, LDLCALC, TRIG, CHOLHDL, LDLDIRECT in the last 72 hours. Thyroid  Function Tests: No results for input(s): TSH, T4TOTAL, FREET4, T3FREE, THYROIDAB in the last 72 hours. Anemia Panel: No results for input(s): VITAMINB12, FOLATE, FERRITIN, TIBC, IRON, RETICCTPCT in the last 72 hours. Sepsis Labs: No results for input(s): PROCALCITON, LATICACIDVEN in the last 168 hours.  Recent Results (from the past 240 hours)  Resp panel by RT-PCR (RSV, Flu A&B, Covid) Anterior Nasal Swab     Status: None   Collection Time: 11/01/23  1:38 PM   Specimen: Anterior Nasal Swab  Result Value Ref Range Status   SARS Coronavirus 2 by RT PCR NEGATIVE NEGATIVE Final    Comment: (NOTE) SARS-CoV-2 target nucleic acids are NOT DETECTED.  The SARS-CoV-2 RNA  is generally detectable in upper respiratory specimens during the acute phase of infection. The lowest concentration of SARS-CoV-2 viral copies this assay can detect is 138 copies/mL. A negative result does not preclude SARS-Cov-2 infection and should not be used as the sole basis for treatment or other patient management decisions. A negative result may occur with  improper specimen collection/handling, submission of specimen other than nasopharyngeal swab, presence of viral mutation(s) within the areas targeted by this assay, and inadequate number of viral copies(<138 copies/mL). A negative result must be combined with clinical observations, patient history, and epidemiological information. The expected result is Negative.  Fact Sheet for Patients:  BloggerCourse.com  Fact Sheet for Healthcare Providers:   SeriousBroker.it  This test is no t yet approved or cleared by the [place]  FDA and  has been authorized for detection and/or diagnosis of SARS-CoV-2 by FDA under an Emergency Use Authorization (EUA). This EUA will remain  in effect (meaning this test can be used) for the duration of the COVID-19 declaration under Section 564(b)(1) of the Act, 21 U.S.C.section 360bbb-3(b)(1), unless the authorization is terminated  or revoked sooner.       Influenza A by PCR NEGATIVE NEGATIVE Final   Influenza B by PCR NEGATIVE NEGATIVE Final    Comment: (NOTE) The Xpert Xpress SARS-CoV-2/FLU/RSV plus assay is intended as an aid in the diagnosis of influenza from Nasopharyngeal swab specimens and should not be used as a sole basis for treatment. Nasal washings and aspirates are unacceptable for Xpert Xpress SARS-CoV-2/FLU/RSV testing.  Fact Sheet for Patients: BloggerCourse.com  Fact Sheet for Healthcare Providers: SeriousBroker.it  This test is not yet approved or cleared by the [place]  FDA and has been authorized for detection and/or diagnosis of SARS-CoV-2 by FDA under an Emergency Use Authorization (EUA). This EUA will remain in effect (meaning this test can be used) for the duration of the COVID-19 declaration under Section 564(b)(1) of the Act, 21 U.S.C. section 360bbb-3(b)(1), unless the authorization is terminated or revoked.     Resp Syncytial Virus by PCR NEGATIVE NEGATIVE Final    Comment: (NOTE) Fact Sheet for Patients: BloggerCourse.com  Fact Sheet for Healthcare Providers: SeriousBroker.it  This test is not yet approved or cleared by the [place]  FDA and has been authorized for detection and/or diagnosis of SARS-CoV-2 by FDA under an Emergency Use Authorization (EUA). This EUA will remain in effect (meaning this test can be used) for  the duration of the COVID-19 declaration under Section 564(b)(1) of the Act, 21 U.S.C. section 360bbb-3(b)(1), unless the authorization is terminated or revoked.  Performed at [facility], [address] [place], [place] [address]   C Difficile Quick Screen w PCR reflex     Status: None   Collection Time: 11/01/23  5:13 PM   Specimen: STOOL  Result Value Ref Range Status   C Diff antigen NEGATIVE NEGATIVE Final   C Diff toxin NEGATIVE NEGATIVE Final   C Diff interpretation No C. difficile detected.  Final    Comment: Performed at [facility], [address] [place], [place] [address]  Gastrointestinal Panel by PCR , Stool     Status: Abnormal   Collection Time: 11/01/23  5:13 PM   Specimen: Stool  Result Value Ref Range Status   Campylobacter species NOT DETECTED NOT DETECTED Final   Plesimonas shigelloides NOT DETECTED NOT DETECTED Final   Salmonella species DETECTED (A) NOT DETECTED Final    Comment: RESULT CALLED TO, READ BACK BY  AND VERIFIED WITH: TINNIE HOPKINS, RN AT 1010 ON 11/02/23 BY GM    Yersinia enterocolitica NOT DETECTED NOT DETECTED Final   Vibrio species NOT DETECTED NOT DETECTED Final   Vibrio cholerae NOT DETECTED NOT DETECTED Final   Enteroaggregative E coli (EAEC) NOT DETECTED NOT DETECTED Final   Enteropathogenic E coli (EPEC) NOT DETECTED NOT DETECTED Final   Enterotoxigenic E coli (ETEC) NOT DETECTED NOT DETECTED Final   Shiga like toxin producing E coli (STEC) NOT DETECTED NOT DETECTED Final   Shigella/Enteroinvasive E coli (EIEC) NOT DETECTED NOT DETECTED Final   Cryptosporidium NOT DETECTED NOT DETECTED Final   Cyclospora cayetanensis NOT DETECTED NOT DETECTED Final   Entamoeba histolytica NOT DETECTED NOT DETECTED Final   Giardia lamblia NOT DETECTED NOT DETECTED Final   Adenovirus F40/41 NOT DETECTED NOT DETECTED Final   Astrovirus NOT DETECTED NOT DETECTED Final   Norovirus GI/GII NOT DETECTED NOT DETECTED  Final   Rotavirus A NOT DETECTED NOT DETECTED Final   Sapovirus (I, II, IV, and V) NOT DETECTED NOT DETECTED Final    Comment: Performed at [facility], [address], [place], [place] [address]         Radiology Studies: No results found.       Scheduled Meds:  enoxaparin (LOVENOX) injection  40 mg Subcutaneous Q24H   feeding supplement  237 mL Oral TID BM   hydrochlorothiazide   25 mg Oral q morning   insulin aspart  0-15 Units Subcutaneous TID WC   pantoprazole   40 mg Oral Daily   Continuous Infusions:  0.9 % NaCl with KCl 40 mEq / L 100 mL/hr at 11/04/23 9164   sodium PHOSPHATE IVPB (in mmol) 30 mmol (11/04/23 0951)     LOS: 3 days    Time spent: 48 minutes spent on 11/04/2023 caring for this patient face-to-face including chart review, ordering labs/tests, documenting, discussion with nursing staff, consultants, updating family and interview/physical exam    Camellia PARAS [place], DO [facility] Available via Epic secure chat 7am-7pm After these hours, please refer to coverage provider listed on [email or web address] 11/04/2023, 12:37 PM

## 2023-11-04 NOTE — TOC Progression Note (Signed)
 Transition of Care [facility]) - Progression Note   Patient Details  Name: Hannah Frazier MRN: [id number] Date of Birth: 1950/11/08  [facility] CM/SW Contact  Duwaine GORMAN Aran, LCSW Phone Number: 11/04/2023, 9:17 AM  Clinical Narrative: PT evaluation did not recommend any DME or PT follow up. Patient no longer requiring IV antibiotics at this time. No care management needs identified at this time.  Expected Discharge Plan: Home/Self Care Barriers to Discharge: Continued Medical Work up  Expected Discharge Plan and Services In-house Referral: Clinical Social Work Living arrangements for the past 2 months: Single Family Home              DME Arranged: N/A DME Agency: NA  Social Drivers of Health (SDOH) Interventions SDOH Screenings   Food Insecurity: No Food Insecurity (11/01/2023)  Housing: Low Risk  (11/01/2023)  Transportation Needs: No Transportation Needs (11/01/2023)  Utilities: Not At Risk (11/01/2023)  Alcohol Screen: Low Risk  (07/22/2022)  Depression (PHQ2-9): Low Risk  (03/02/2023)  Financial Resource Strain: Low Risk  (02/26/2023)  Physical Activity: Inactive (02/26/2023)  Social Connections: Moderately Integrated (11/01/2023)  Stress: No Stress Concern Present (02/26/2023)  Tobacco Use: Low Risk  (11/01/2023)  Health Literacy: Adequate Health Literacy (10/29/2022)   Readmission Risk Interventions     No data to display

## 2023-11-05 DIAGNOSIS — K529 Noninfective gastroenteritis and colitis, unspecified: Secondary | ICD-10-CM | POA: Diagnosis not present

## 2023-11-05 LAB — BASIC METABOLIC PANEL WITH GFR
Anion gap: 8 (ref 5–15)
BUN: <5 mg/dL — ABNORMAL LOW (ref 8–23)
CO2: 27 mmol/L (ref 22–32)
Calcium: 8.4 mg/dL — ABNORMAL LOW (ref 8.9–10.3)
Chloride: 104 mmol/L (ref 98–111)
Creatinine, Ser: 0.56 mg/dL (ref 0.44–1.00)
GFR, Estimated: >60 mL/min (ref >60)
Glucose, Bld: 97 mg/dL (ref 70–99)
Potassium: 4 mmol/L (ref 3.5–5.1)
Sodium: 138 mmol/L (ref 135–145)

## 2023-11-05 LAB — MAGNESIUM: Magnesium: 2.2 mg/dL (ref 1.7–2.4)

## 2023-11-05 LAB — PHOSPHORUS: Phosphorus: 2.2 mg/dL — ABNORMAL LOW (ref 2.5–4.6)

## 2023-11-05 LAB — GLUCOSE, CAPILLARY
Glucose-Capillary: 103 mg/dL — ABNORMAL HIGH (ref 70–99)
Glucose-Capillary: 119 mg/dL — ABNORMAL HIGH (ref 70–99)

## 2023-11-05 MED ORDER — SODIUM PHOSPHATES 45 MMOLE/15ML IV SOLN
15.0000 mmol | Freq: Once | INTRAVENOUS | Status: AC
  Administered 2023-11-05: 15 mmol via INTRAVENOUS
  Filled 2023-11-05: qty 5

## 2023-11-05 NOTE — Progress Notes (Signed)
 Pt awaiting on ride  IV and teli removed  AVS reviewed with pt

## 2023-11-05 NOTE — Discharge Summary (Signed)
 Physician Discharge Summary  Hannah Frazier FMW:[id number] DOB: [birth year] DOA: 11/01/2023  PCP: Tanda Bleacher, MD  Admit date: 11/01/2023 Discharge date: 11/05/2023  Admitted From: Home Disposition: Home  Recommendations for Outpatient Follow-up:  Follow up with PCP in 1-2 weeks Recommend repeat BMP, magnesium, phosphorus level at visit to ensure electrolytes remain stable   Home Health: No Equipment/Devices: None  Discharge Condition: Stable CODE STATUS: DNR Diet recommendation: Heart healthy/consistent carb regimen diet  History of present illness:  Hannah Frazier is a 73 y.o. female with past medical history significant for HTN, DM2, anxiety/depression, B-cell lymphoma, osteoarthritis who presented to [facility] ED on 11/01/2023 via EMS from home generalized weakness, dizziness and diarrhea. Patient reports she had a colonoscopy on Wednesday and found a benign polyp. She has had poor appetite since Sunday and on Monday, she started having dizziness and chills. Today, she woke up with profuse nonbloody diarrhea and has been persistent all day. She reports feeling weak and has had decreased fluid intake in the setting of her fall appetite but denies any abdominal pain, nausea, vomiting, bloody stools, fever, chest pain, shortness of breath or syncope.  She denies any recent antibiotics use, drinking unfiltered or stream water or eating restaurant food.   In the ED, temperature 98.8-> 100.5 F, SBP 120-150s, SpO2 98% on room air. Initial labs significant for sodium 134, K+ 3.8, bicarb 21, glucose 157, normal renal function, bilirubin 1.3, WBC 11.2, negative flu, RSV and COVID test, negative C. difficile screen. EKG shows sinus rhythm with RBBB and occasional PVCs. CXR shows no active disease.  CT A/P shows evidence of colitis but no associated abscess or collection.  Pt received IV NS 1 L bolus, IV Zofran , IV Cipro  and IV Flagyl . TRH was consulted for admission.   Hospital  course:  Salmonella gastroenteritis Patient presenting from home with generalized weakness, dizziness and diarrhea.  Was noted to be orthostatic on EMS arrival.  Denies blood in her stool.  No recent antibiotic use.  Denies any sick contacts.  C. difficile PCR negative.  GI PCR panel positive for Salmonella.  Initially started on IV antibiotics with ciprofloxacin  and Flagyl , now discontinued to avoid further Salmonella shedding per IDSA guidelines.  Patient was maintained on IV fluid hydration with aggressive electrolyte replacements with slow and gradual improvement of symptoms.  Volume of diarrhea markedly improved at time of discharge and tolerating oral diet.  Outpatient follow-up with PCP 1 week with repeat labs recommended.   Hypovolemic hyponatremia: Resolved. Etiology secondary to GI loss from Salmonella gastroenteritis as above.  Patient was aggressively hydrated with IV fluids with improvement of sodium to 138 at time of discharge.  Repeat BMP 1 week.   Hypokalemia Hypophosphatemia Etiology likely secondary from GI losses above.  Aggressively repleted during hospitalization.  Recommend repeat electrolytes 1 week at PCP visit.     HTN Hydrochlorothiazide  25 mg p.o. daily   DM2 Hemoglobin A1c 6.2 on 11/02/2023, well-controlled.  Diet controlled at baseline.   Anxiety/depression Ativan  0.5 mg p.o. nightly as needed anxiety  GERD Continue Protonix    History of B-cell lymphoma Continue outpatient follow-up with medical oncology, Dr. Lonn  Discharge Diagnoses:  Principal Problem:   Colitis Active Problems:   Generalized weakness   Dehydration   Gastroesophageal reflux disease   Type 2 diabetes mellitus without complication, without long-term current use of insulin [facility])    Discharge Instructions  Discharge Instructions     Call MD for:  difficulty breathing, headache or visual disturbances  Complete by: As directed    Call MD for:  extreme fatigue   Complete by: As  directed    Call MD for:  persistant dizziness or light-headedness   Complete by: As directed    Call MD for:  persistant nausea and vomiting   Complete by: As directed    Call MD for:  severe uncontrolled pain   Complete by: As directed    Call MD for:  temperature >100.4   Complete by: As directed    Diet - low sodium heart healthy   Complete by: As directed    Increase activity slowly   Complete by: As directed       Allergies as of 11/05/2023       Reactions   Simvastatin Swelling, Rash   Tongue swelling Tongue swelling Tongue swelling   Lisinopril Nausea Only, Other (See Comments)   Sertraline Hcl Other (See Comments)        Medication List     STOP taking these medications    fenofibrate  48 MG tablet Commonly known as: Tricor    traZODone  100 MG tablet Commonly known as: DESYREL        TAKE these medications    hydrochlorothiazide  25 MG tablet Commonly known as: HYDRODIURIL  Take 1 tablet by mouth every morning.   LORazepam  1 MG tablet Commonly known as: ATIVAN  Take 0.5 tablets (0.5 mg total) by mouth at bedtime as needed for anxiety (for sleep).   meloxicam  15 MG tablet Commonly known as: MOBIC  Take 1 tablet by mouth once a day with food What changed:  when to take this reasons to take this   metroNIDAZOLE  0.75 % cream Commonly known as: METROCREAM  Apply dime-size amount to face twice daily and taper use to daily once improved.   ondansetron  4 MG tablet Commonly known as: Zofran  Take 1 tablet (4 mg total) by mouth every 8 (eight) hours as needed for nausea or vomiting.   ONE TOUCH ULTRA 2 w/Device Kit Use to check blood sugars two times a day   OneTouch Ultra test strip Generic drug: glucose blood USE TO TEST YOUR BLOOD SUGAR ONCE A DAY   pantoprazole  40 MG tablet Commonly known as: PROTONIX  Take 1 tablet (40 mg total) by mouth daily.        Follow-up Information     Tanda Bleacher, MD. Schedule an appointment as soon as  possible for a visit in 1 week(s).   Specialty: Family Medicine Contact information: [address] [place] [address] [phone]                Allergies  Allergen Reactions   Simvastatin Swelling and Rash    Tongue swelling  Tongue swelling Tongue swelling    Lisinopril Nausea Only and Other (See Comments)   Sertraline Hcl Other (See Comments)    Consultations: None   Procedures/Studies: CT ABDOMEN PELVIS W CONTRAST Result Date: 11/01/2023 CLINICAL DATA:  Diverticulitis, complication suspected. EXAM: CT ABDOMEN AND PELVIS WITH CONTRAST TECHNIQUE: Multidetector CT imaging of the abdomen and pelvis was performed using the standard protocol following bolus administration of intravenous contrast. RADIATION DOSE REDUCTION: This exam was performed according to the departmental dose-optimization program which includes automated exposure control, adjustment of the mA and/or kV according to patient size and/or use of iterative reconstruction technique. CONTRAST:  OMNIPAQUE  IOHEXOL  300 MG/ML  SOLN COMPARISON:  CT scan abdomen and pelvis from 08/12/2023. FINDINGS: Lower chest: There are subpleural atelectatic changes in the visualized lung bases. No  overt consolidation. No pleural effusion. The heart is normal in size. No pericardial effusion. Hepatobiliary: The liver is normal in size. Non-cirrhotic configuration. No suspicious mass. These is mild diffuse hepatic steatosis. No intrahepatic or extrahepatic bile duct dilation. No calcified gallstones. Normal gallbladder wall thickness. No pericholecystic inflammatory changes. Pancreas: Unremarkable. No pancreatic ductal dilatation or surrounding inflammatory changes. Spleen: Within normal limits. No focal lesion. Adrenals/Urinary Tract: Adrenal glands are unremarkable. No suspicious renal mass. No hydronephrosis. No renal or ureteric calculi. Urinary bladder is under distended, precluding optimal assessment. However, no  large mass or stones identified. No perivesical fat stranding. Stomach/Bowel: No disproportionate dilation of the small or large bowel loops. There is liquid unformed stool in the colon, nonspecific but commonly seen with diarrhea. Patient is status post partial right hemicolectomy with end-to-side ileocolonic anastomosis in the right upper quadrant. There is mild mucosal hyperenhancement of the sigmoid colon/rectum with associated prominence of vasa recta concerning for colitis. Findings are most likely infective/inflammatory given the distribution pattern. No associated abscess or collection. No pneumatosis, portal venous gas or pneumoperitoneum. Vascular/Lymphatic: There is stable mildly enlarged right mid abdominal mesenteric lymph node (series 2, image 60). No new abdominal or pelvic lymphadenopathy, by size criteria. No aneurysmal dilation of the major abdominal arteries. There are mild peripheral atherosclerotic vascular calcifications of the aorta and its major branches. Reproductive: The uterus is surgically absent. No large adnexal mass. Other: There is a tiny fat containing umbilical hernia. The soft tissues and abdominal wall are otherwise unremarkable. Musculoskeletal: No suspicious osseous lesions. There are mild - moderate multilevel degenerative changes in the visualized spine. IMPRESSION: 1. Mild mucosal hyperenhancement of the sigmoid colon/rectum with associated prominence of vasa recta, concerning for colitis. No associated abscess or collection. 2. Multiple other nonacute observations, as described above. Aortic Atherosclerosis (ICD10-I70.0). Electronically Signed   By: Ree Molt M.D.   On: 11/01/2023 17:14   DG Chest Portable 1 View Result Date: 11/01/2023 CLINICAL DATA:  Dizziness and shortness of breath. Possible pneumonia. EXAM: PORTABLE CHEST 1 VIEW COMPARISON:  CT scan 03/03/2021 FINDINGS: The cardiac silhouette, mediastinal and hilar contours are within normal limits. Low lung  volumes with vascular crowding and streaky atelectasis but no pulmonary infiltrates or pleural effusions. No pneumothorax. The bony thorax is intact. IMPRESSION: Low lung volumes with vascular crowding and streaky atelectasis. Electronically Signed   By: MYRTIS Stammer M.D.   On: 11/01/2023 14:48   MM 3D DIAGNOSTIC MAMMOGRAM BILATERAL BREAST Result Date: 10/07/2023 CLINICAL DATA:  73 year old female here for second (delayed) follow-up of 2 probably benign masses, probable oil cysts, in the upper inner left breast anterior depth. Patient also due for annual screening of the right breast. EXAM: DIGITAL DIAGNOSTIC BILATERAL MAMMOGRAM WITH TOMOSYNTHESIS AND CAD; ULTRASOUND LEFT BREAST LIMITED TECHNIQUE: Bilateral digital diagnostic mammography and breast tomosynthesis was performed. The images were evaluated with computer-aided detection. ; Targeted ultrasound examination of the left breast was performed. COMPARISON:  Previous exam(s). ACR Breast Density Category b: There are scattered areas of fibroglandular density. FINDINGS: Mammogram: Stable appearance of 2 tiny fat containing well-circumscribed oval and round masses in the upper anterior breast central to slightly medial. No suspicious microcalcifications, architectural distortion or masses seen in either breast. Left breast ultrasound: Stable appearance a tiny 3 x 3 by 3 mm oval cyst in the left breast 11 o'clock 1 cm from the nipple. Second smaller 2 x 2 x 2 mm tiny round mixed density complex cyst at 11 o'clock 1 cm. These cysts correspond to  the benign oil cysts seen on mammogram. IMPRESSION: No mammographic evidence of malignancy. Stable benign oil cysts in the left anterior breast. RECOMMENDATION: Routine annual screening mammogram in 1 year. I have discussed the findings and recommendations with the patient. If applicable, a reminder letter will be sent to the patient regarding the next appointment. BI-RADS CATEGORY  2: Benign. Electronically Signed   By:  Rosina Gelineau M.D.   On: 10/07/2023 11:41   US  LIMITED ULTRASOUND INCLUDING AXILLA LEFT BREAST  Result Date: 10/07/2023 CLINICAL DATA:  73 year old female here for second (delayed) follow-up of 2 probably benign masses, probable oil cysts, in the upper inner left breast anterior depth. Patient also due for annual screening of the right breast. EXAM: DIGITAL DIAGNOSTIC BILATERAL MAMMOGRAM WITH TOMOSYNTHESIS AND CAD; ULTRASOUND LEFT BREAST LIMITED TECHNIQUE: Bilateral digital diagnostic mammography and breast tomosynthesis was performed. The images were evaluated with computer-aided detection. ; Targeted ultrasound examination of the left breast was performed. COMPARISON:  Previous exam(s). ACR Breast Density Category b: There are scattered areas of fibroglandular density. FINDINGS: Mammogram: Stable appearance of 2 tiny fat containing well-circumscribed oval and round masses in the upper anterior breast central to slightly medial. No suspicious microcalcifications, architectural distortion or masses seen in either breast. Left breast ultrasound: Stable appearance a tiny 3 x 3 by 3 mm oval cyst in the left breast 11 o'clock 1 cm from the nipple. Second smaller 2 x 2 x 2 mm tiny round mixed density complex cyst at 11 o'clock 1 cm. These cysts correspond to the benign oil cysts seen on mammogram. IMPRESSION: No mammographic evidence of malignancy. Stable benign oil cysts in the left anterior breast. RECOMMENDATION: Routine annual screening mammogram in 1 year. I have discussed the findings and recommendations with the patient. If applicable, a reminder letter will be sent to the patient regarding the next appointment. BI-RADS CATEGORY  2: Benign. Electronically Signed   By: Rosina Gelineau M.D.   On: 10/07/2023 11:41     Subjective: Patient seen examined bedside, sitting in bed.  No complaints this morning.  Feels well.  Tolerating diet.  Discharge Exam: Vitals:   11/04/23 2146 11/05/23 0611  BP: 126/65  132/69  Pulse: 67 62  Resp: 19 16  Temp: 98.2 F (36.8 C) 98.6 F (37 C)  SpO2: 94% 96%   Vitals:   11/04/23 0515 11/04/23 1325 11/04/23 2146 11/05/23 0611  BP: 125/66 129/70 126/65 132/69  Pulse: 65 72 67 62  Resp: 18 20 19 16   Temp: 98.4 F (36.9 C) 98.9 F (37.2 C) 98.2 F (36.8 C) 98.6 F (37 C)  TempSrc:  Oral    SpO2: 92% 92% 94% 96%  Weight:      Height:        Physical Exam: GEN: NAD, alert and oriented x 3, wd/wn HEENT: NCAT, PERRL, EOMI, sclera clear, MMM PULM: CTAB w/o wheezes/crackles, normal respiratory effort, on room air CV: RRR w/o M/G/R GI: abd soft, NTND, + BS MSK: no peripheral edema, muscle strength globally intact 5/5 bilateral upper/lower extremities NEURO: CN II-XII intact, no focal deficits, sensation to light touch intact PSYCH: normal mood/affect Integumentary: dry/intact, no rashes or wounds    The results of significant diagnostics from this hospitalization (including imaging, microbiology, ancillary and laboratory) are listed below for reference.     Microbiology: Recent Results (from the past 240 hours)  Resp panel by RT-PCR (RSV, Flu A&B, Covid) Anterior Nasal Swab     Status: None   Collection Time: 11/01/23  1:38 PM   Specimen: Anterior Nasal Swab  Result Value Ref Range Status   SARS Coronavirus 2 by RT PCR NEGATIVE NEGATIVE Final    Comment: (NOTE) SARS-CoV-2 target nucleic acids are NOT DETECTED.  The SARS-CoV-2 RNA is generally detectable in upper respiratory specimens during the acute phase of infection. The lowest concentration of SARS-CoV-2 viral copies this assay can detect is 138 copies/mL. A negative result does not preclude SARS-Cov-2 infection and should not be used as the sole basis for treatment or other patient management decisions. A negative result may occur with  improper specimen collection/handling, submission of specimen other than nasopharyngeal swab, presence of viral mutation(s) within the areas  targeted by this assay, and inadequate number of viral copies(<138 copies/mL). A negative result must be combined with clinical observations, patient history, and epidemiological information. The expected result is Negative.  Fact Sheet for Patients:  BloggerCourse.com  Fact Sheet for Healthcare Providers:  SeriousBroker.it  This test is no t yet approved or cleared by the [place]  FDA and  has been authorized for detection and/or diagnosis of SARS-CoV-2 by FDA under an Emergency Use Authorization (EUA). This EUA will remain  in effect (meaning this test can be used) for the duration of the COVID-19 declaration under Section 564(b)(1) of the Act, 21 U.S.C.section 360bbb-3(b)(1), unless the authorization is terminated  or revoked sooner.       Influenza A by PCR NEGATIVE NEGATIVE Final   Influenza B by PCR NEGATIVE NEGATIVE Final    Comment: (NOTE) The Xpert Xpress SARS-CoV-2/FLU/RSV plus assay is intended as an aid in the diagnosis of influenza from Nasopharyngeal swab specimens and should not be used as a sole basis for treatment. Nasal washings and aspirates are unacceptable for Xpert Xpress SARS-CoV-2/FLU/RSV testing.  Fact Sheet for Patients: BloggerCourse.com  Fact Sheet for Healthcare Providers: SeriousBroker.it  This test is not yet approved or cleared by the [place]  FDA and has been authorized for detection and/or diagnosis of SARS-CoV-2 by FDA under an Emergency Use Authorization (EUA). This EUA will remain in effect (meaning this test can be used) for the duration of the COVID-19 declaration under Section 564(b)(1) of the Act, 21 U.S.C. section 360bbb-3(b)(1), unless the authorization is terminated or revoked.     Resp Syncytial Virus by PCR NEGATIVE NEGATIVE Final    Comment: (NOTE) Fact Sheet for  Patients: BloggerCourse.com  Fact Sheet for Healthcare Providers: SeriousBroker.it  This test is not yet approved or cleared by the [place]  FDA and has been authorized for detection and/or diagnosis of SARS-CoV-2 by FDA under an Emergency Use Authorization (EUA). This EUA will remain in effect (meaning this test can be used) for the duration of the COVID-19 declaration under Section 564(b)(1) of the Act, 21 U.S.C. section 360bbb-3(b)(1), unless the authorization is terminated or revoked.  Performed at [facility], [address], [place], [place] [address]   C Difficile Quick Screen w PCR reflex     Status: None   Collection Time: 11/01/23  5:13 PM   Specimen: STOOL  Result Value Ref Range Status   C Diff antigen NEGATIVE NEGATIVE Final   C Diff toxin NEGATIVE NEGATIVE Final   C Diff interpretation No C. difficile detected.  Final    Comment: Performed at [facility], [address] [place], [place] [address]  Gastrointestinal Panel by PCR , Stool     Status: Abnormal   Collection Time: 11/01/23  5:13 PM   Specimen: Stool  Result  Value Ref Range Status   Campylobacter species NOT DETECTED NOT DETECTED Final   Plesimonas shigelloides NOT DETECTED NOT DETECTED Final   Salmonella species DETECTED (A) NOT DETECTED Final    Comment: RESULT CALLED TO, READ BACK BY AND VERIFIED WITH: TINNIE HOPKINS, RN AT 1010 ON 11/02/23 BY GM    Yersinia enterocolitica NOT DETECTED NOT DETECTED Final   Vibrio species NOT DETECTED NOT DETECTED Final   Vibrio cholerae NOT DETECTED NOT DETECTED Final   Enteroaggregative E coli (EAEC) NOT DETECTED NOT DETECTED Final   Enteropathogenic E coli (EPEC) NOT DETECTED NOT DETECTED Final   Enterotoxigenic E coli (ETEC) NOT DETECTED NOT DETECTED Final   Shiga like toxin producing E coli (STEC) NOT DETECTED NOT DETECTED Final   Shigella/Enteroinvasive E coli (EIEC)  NOT DETECTED NOT DETECTED Final   Cryptosporidium NOT DETECTED NOT DETECTED Final   Cyclospora cayetanensis NOT DETECTED NOT DETECTED Final   Entamoeba histolytica NOT DETECTED NOT DETECTED Final   Giardia lamblia NOT DETECTED NOT DETECTED Final   Adenovirus F40/41 NOT DETECTED NOT DETECTED Final   Astrovirus NOT DETECTED NOT DETECTED Final   Norovirus GI/GII NOT DETECTED NOT DETECTED Final   Rotavirus A NOT DETECTED NOT DETECTED Final   Sapovirus (I, II, IV, and V) NOT DETECTED NOT DETECTED Final    Comment: Performed at [facility], [address] [place], [place] [address]     Labs: BNP (last 3 results) No results for input(s): BNP in the last 8760 hours. Basic Metabolic Panel: Recent Labs  Lab 11/01/23 1404 11/02/23 0545 11/03/23 0535 11/04/23 0509 11/05/23 0527  NA 134* 134* 133* 137 138  K 3.8 2.9* 2.7* 3.2* 4.0  CL 99 102 100 102 104  CO2 21* 22 24 27 27   GLUCOSE 157* 133* 112* 120* 97  BUN 11 8 <5* <5* <5*  CREATININE 0.90 0.69 0.54 0.65 0.56  CALCIUM 8.7* 8.2* 8.0* 8.2* 8.4*  MG 2.0  --  2.0 2.1 2.2  PHOS  --   --  1.4* 1.4* 2.2*   Liver Function Tests: Recent Labs  Lab 11/01/23 1404  AST 33  ALT 12  ALKPHOS 62  BILITOT 1.3*  PROT 7.1  ALBUMIN 3.9   Recent Labs  Lab 11/01/23 1404  LIPASE 21   No results for input(s): AMMONIA in the last 168 hours. CBC: Recent Labs  Lab 11/01/23 1404 11/02/23 0545  WBC 11.2* 6.9  NEUTROABS 9.8*  --   HGB 12.6 12.3  HCT 39.2 38.4  MCV 88.3 87.3  PLT 220 175   Cardiac Enzymes: No results for input(s): CKTOTAL, CKMB, CKMBINDEX, TROPONINI in the last 168 hours. BNP: Invalid input(s): POCBNP CBG: Recent Labs  Lab 11/04/23 0752 11/04/23 1141 11/04/23 1616 11/04/23 2143 11/05/23 0810  GLUCAP 91 121* 114* 134* 103*   D-Dimer No results for input(s): DDIMER in the last 72 hours. Hgb A1c No results for input(s): HGBA1C in the last 72 hours. Lipid Profile No results for  input(s): CHOL, HDL, LDLCALC, TRIG, CHOLHDL, LDLDIRECT in the last 72 hours. Thyroid  function studies No results for input(s): TSH, T4TOTAL, T3FREE, THYROIDAB in the last 72 hours.  Invalid input(s): FREET3 Anemia work up No results for input(s): VITAMINB12, FOLATE, FERRITIN, TIBC, IRON, RETICCTPCT in the last 72 hours. Urinalysis    Component Value Date/Time   COLORURINE YELLOW 11/02/2023 1036   APPEARANCEUR HAZY (A) 11/02/2023 1036   LABSPEC 1.024 11/02/2023 1036   PHURINE 5.0 11/02/2023 1036   GLUCOSEU 50 (A) 11/02/2023  1036   HGBUR NEGATIVE 11/02/2023 1036   BILIRUBINUR NEGATIVE 11/02/2023 1036   KETONESUR 5 (A) 11/02/2023 1036   PROTEINUR 30 (A) 11/02/2023 1036   UROBILINOGEN 1.0 04/29/2012 0625   NITRITE NEGATIVE 11/02/2023 1036   LEUKOCYTESUR SMALL (A) 11/02/2023 1036   Sepsis Labs Recent Labs  Lab 11/01/23 1404 11/02/23 0545  WBC 11.2* 6.9   Microbiology Recent Results (from the past 240 hours)  Resp panel by RT-PCR (RSV, Flu A&B, Covid) Anterior Nasal Swab     Status: None   Collection Time: 11/01/23  1:38 PM   Specimen: Anterior Nasal Swab  Result Value Ref Range Status   SARS Coronavirus 2 by RT PCR NEGATIVE NEGATIVE Final    Comment: (NOTE) SARS-CoV-2 target nucleic acids are NOT DETECTED.  The SARS-CoV-2 RNA is generally detectable in upper respiratory specimens during the acute phase of infection. The lowest concentration of SARS-CoV-2 viral copies this assay can detect is 138 copies/mL. A negative result does not preclude SARS-Cov-2 infection and should not be used as the sole basis for treatment or other patient management decisions. A negative result may occur with  improper specimen collection/handling, submission of specimen other than nasopharyngeal swab, presence of viral mutation(s) within the areas targeted by this assay, and inadequate number of viral copies(<138 copies/mL). A negative result must be combined  with clinical observations, patient history, and epidemiological information. The expected result is Negative.  Fact Sheet for Patients:  BloggerCourse.com  Fact Sheet for Healthcare Providers:  SeriousBroker.it  This test is no t yet approved or cleared by the [place]  FDA and  has been authorized for detection and/or diagnosis of SARS-CoV-2 by FDA under an Emergency Use Authorization (EUA). This EUA will remain  in effect (meaning this test can be used) for the duration of the COVID-19 declaration under Section 564(b)(1) of the Act, 21 U.S.C.section 360bbb-3(b)(1), unless the authorization is terminated  or revoked sooner.       Influenza A by PCR NEGATIVE NEGATIVE Final   Influenza B by PCR NEGATIVE NEGATIVE Final    Comment: (NOTE) The Xpert Xpress SARS-CoV-2/FLU/RSV plus assay is intended as an aid in the diagnosis of influenza from Nasopharyngeal swab specimens and should not be used as a sole basis for treatment. Nasal washings and aspirates are unacceptable for Xpert Xpress SARS-CoV-2/FLU/RSV testing.  Fact Sheet for Patients: BloggerCourse.com  Fact Sheet for Healthcare Providers: SeriousBroker.it  This test is not yet approved or cleared by the [place]  FDA and has been authorized for detection and/or diagnosis of SARS-CoV-2 by FDA under an Emergency Use Authorization (EUA). This EUA will remain in effect (meaning this test can be used) for the duration of the COVID-19 declaration under Section 564(b)(1) of the Act, 21 U.S.C. section 360bbb-3(b)(1), unless the authorization is terminated or revoked.     Resp Syncytial Virus by PCR NEGATIVE NEGATIVE Final    Comment: (NOTE) Fact Sheet for Patients: BloggerCourse.com  Fact Sheet for Healthcare Providers: SeriousBroker.it  This test is not yet approved  or cleared by the [place]  FDA and has been authorized for detection and/or diagnosis of SARS-CoV-2 by FDA under an Emergency Use Authorization (EUA). This EUA will remain in effect (meaning this test can be used) for the duration of the COVID-19 declaration under Section 564(b)(1) of the Act, 21 U.S.C. section 360bbb-3(b)(1), unless the authorization is terminated or revoked.  Performed at [facility], [address] [place], [place] [address]   C Difficile Quick Screen w PCR  reflex     Status: None   Collection Time: 11/01/23  5:13 PM   Specimen: STOOL  Result Value Ref Range Status   C Diff antigen NEGATIVE NEGATIVE Final   C Diff toxin NEGATIVE NEGATIVE Final   C Diff interpretation No C. difficile detected.  Final    Comment: Performed at [facility], [address], [place], [place] [address]  Gastrointestinal Panel by PCR , Stool     Status: Abnormal   Collection Time: 11/01/23  5:13 PM   Specimen: Stool  Result Value Ref Range Status   Campylobacter species NOT DETECTED NOT DETECTED Final   Plesimonas shigelloides NOT DETECTED NOT DETECTED Final   Salmonella species DETECTED (A) NOT DETECTED Final    Comment: RESULT CALLED TO, READ BACK BY AND VERIFIED WITH: TINNIE HOPKINS, RN AT 1010 ON 11/02/23 BY GM    Yersinia enterocolitica NOT DETECTED NOT DETECTED Final   Vibrio species NOT DETECTED NOT DETECTED Final   Vibrio cholerae NOT DETECTED NOT DETECTED Final   Enteroaggregative E coli (EAEC) NOT DETECTED NOT DETECTED Final   Enteropathogenic E coli (EPEC) NOT DETECTED NOT DETECTED Final   Enterotoxigenic E coli (ETEC) NOT DETECTED NOT DETECTED Final   Shiga like toxin producing E coli (STEC) NOT DETECTED NOT DETECTED Final   Shigella/Enteroinvasive E coli (EIEC) NOT DETECTED NOT DETECTED Final   Cryptosporidium NOT DETECTED NOT DETECTED Final   Cyclospora cayetanensis NOT DETECTED NOT DETECTED Final   Entamoeba histolytica  NOT DETECTED NOT DETECTED Final   Giardia lamblia NOT DETECTED NOT DETECTED Final   Adenovirus F40/41 NOT DETECTED NOT DETECTED Final   Astrovirus NOT DETECTED NOT DETECTED Final   Norovirus GI/GII NOT DETECTED NOT DETECTED Final   Rotavirus A NOT DETECTED NOT DETECTED Final   Sapovirus (I, II, IV, and V) NOT DETECTED NOT DETECTED Final    Comment: Performed at [facility], [address], [place], [place] [address]     Time coordinating discharge: Over 30 minutes  SIGNED:   Camellia PARAS [place], DO  [facility] 11/05/2023, 10:18 AM

## 2023-11-07 ENCOUNTER — Telehealth: Payer: Self-pay

## 2023-11-07 NOTE — Transitions of Care (Post Inpatient/ED Visit) (Unsigned)
   11/07/2023  Name: Hannah Frazier MRN: [id number] DOB: 07/08/1950  Today's TOC FU Call Status: Today's TOC FU Call Status:: Unsuccessful Call (1st Attempt) Unsuccessful Call (1st Attempt) Date: 11/07/23  Attempted to reach the patient regarding the most recent Inpatient/ED visit.  Follow Up Plan: Additional outreach attempts will be made to reach the patient to complete the Transitions of Care (Post Inpatient/ED visit) call.   Signature Julian Lemmings, LPN [facility] Nurse Health Advisor Direct Dial [phone]

## 2023-11-08 NOTE — Transitions of Care (Post Inpatient/ED Visit) (Unsigned)
   11/08/2023  Name: Nealie Mchatton MRN: [id number] DOB: 1950-08-27  Today's TOC FU Call Status: Today's TOC FU Call Status:: Unsuccessful Call (1st Attempt) Unsuccessful Call (1st Attempt) Date: 11/08/23  Attempted to reach the patient regarding the most recent Inpatient/ED visit.  Follow Up Plan: Additional outreach attempts will be made to reach the patient to complete the Transitions of Care (Post Inpatient/ED visit) call.   Signature Julian Lemmings, LPN [facility] Nurse Health Advisor Direct Dial [phone]

## 2023-11-09 NOTE — Transitions of Care (Post Inpatient/ED Visit) (Signed)
   11/09/2023  Name: Hannah Frazier MRN: [id number] DOB: 22-Oct-1950  Today's TOC FU Call Status: Today's TOC FU Call Status:: Successful TOC FU Call Completed Unsuccessful Call (1st Attempt) Date: 11/08/23 [facility] FU Call Complete Date: 11/09/23 Patient's Name and Date of Birth confirmed.  Transition Care Management Follow-up Telephone Call Date of Discharge: 11/05/23 Discharge Facility: [facility]) Type of Discharge: Inpatient Admission Primary Inpatient Discharge Diagnosis:: gastroenteritis How have you been since you were released from the hospital?: Better Any questions or concerns?: No  Items Reviewed: Did you receive and understand the discharge instructions provided?: Yes Medications obtained,verified, and reconciled?: Yes (Medications Reviewed) Any new allergies since your discharge?: No Dietary orders reviewed?: Yes Do you have support at home?: Yes People in Home [RPT]: child(ren), adult  Medications Reviewed Today: Medications Reviewed Today     Reviewed by Emmitt Pan, LPN (Licensed Practical Nurse) on 11/09/23 at 0935  Med List Status: <None>   Medication Order Taking? Sig Documenting Provider Last Dose Status Informant  Blood Glucose Monitoring Suppl (ONE TOUCH ULTRA 2) w/Device KIT [id number] Yes Use to check blood sugars two times a day Tanda Bleacher, MD  Active Self, Pharmacy Records  hydrochlorothiazide  (HYDRODIURIL ) 25 MG tablet [id number] Yes Take 1 tablet by mouth every morning. Tanda Bleacher, MD  Active Self, Pharmacy Records  LORazepam  (ATIVAN ) 1 MG tablet [id number] Yes Take 0.5 tablets (0.5 mg total) by mouth at bedtime as needed for anxiety (for sleep). Tanda Bleacher, MD  Active Self, Pharmacy Records  meloxicam  (MOBIC ) 15 MG tablet [id number] Yes Take 1 tablet by mouth once a day with food  Patient taking differently: Take 15 mg by mouth as needed for pain.     Active Self, Pharmacy Records  metroNIDAZOLE  (METROCREAM ) 0.75 % cream [id number]  Yes Apply dime-size amount to face twice daily and taper use to daily once improved.   Active Self, Pharmacy Records  ondansetron  (ZOFRAN ) 4 MG tablet [id number] Yes Take 1 tablet (4 mg total) by mouth every 8 (eight) hours as needed for nausea or vomiting. Tanda Bleacher, MD  Active Self, Pharmacy Records  [facility] ULTRA test strip [id number] Yes USE TO TEST YOUR BLOOD SUGAR ONCE A MICHAE Tanda Bleacher, MD  Active Self, Pharmacy Records  pantoprazole  (PROTONIX ) 40 MG tablet [id number]  Take 1 tablet (40 mg total) by mouth daily.  Patient not taking: Reported on 11/09/2023   Lonn Hicks, MD  Active Self, Pharmacy Records            Home Care and Equipment/Supplies: Were Home Health Services Ordered?: NA Any new equipment or medical supplies ordered?: NA  Functional Questionnaire: Do you need assistance with bathing/showering or dressing?: No Do you need assistance with meal preparation?: No Do you need assistance with eating?: No Do you have difficulty maintaining continence: No Do you need assistance with getting out of bed/getting out of a chair/moving?: No Do you have difficulty managing or taking your medications?: No  Follow up appointments reviewed: PCP Follow-up appointment confirmed?: Yes Date of PCP follow-up appointment?: 11/16/23 Follow-up Provider: [facility] Follow-up appointment confirmed?: NA Do you need transportation to your follow-up appointment?: No Do you understand care options if your condition(s) worsen?: Yes-patient verbalized understanding    SIGNATURE Pan Emmitt, LPN [facility] Nurse Health Advisor Direct Dial [phone]

## 2023-11-09 NOTE — Telephone Encounter (Signed)
 Keep upcoming appointment. During the interim report to the Emergency Department/Urgent Care/call 911 for immediate medical evaluation.

## 2023-11-10 ENCOUNTER — Other Ambulatory Visit: Payer: Self-pay | Admitting: Family Medicine

## 2023-11-10 DIAGNOSIS — F419 Anxiety disorder, unspecified: Secondary | ICD-10-CM

## 2023-11-10 NOTE — Telephone Encounter (Signed)
 I called patient with pcp recommendations no one answered so I left a voicemail to return my call.

## 2023-11-14 ENCOUNTER — Encounter: Payer: Self-pay | Admitting: Hematology and Oncology

## 2023-11-15 ENCOUNTER — Other Ambulatory Visit: Payer: Self-pay | Admitting: Hematology and Oncology

## 2023-11-15 ENCOUNTER — Telehealth: Payer: Self-pay

## 2023-11-15 DIAGNOSIS — Z8572 Personal history of non-Hodgkin lymphomas: Secondary | ICD-10-CM

## 2023-11-15 NOTE — Telephone Encounter (Signed)
 Called and given mychart message from Dr. Lonn. Scheduled appts 1/7 lab appt and see Dr. Lonn on 1/15 at 1140. She will call Radiology to schedule CT on 1/7.

## 2023-11-15 NOTE — Telephone Encounter (Signed)
 I called patient with pcp recommendations and patient stated she is feeling better just weak and has an appointment tomorrow.

## 2023-11-16 ENCOUNTER — Encounter: Payer: Self-pay | Admitting: Family

## 2023-11-16 ENCOUNTER — Ambulatory Visit (INDEPENDENT_AMBULATORY_CARE_PROVIDER_SITE_OTHER): Admitting: Family

## 2023-11-16 VITALS — BP 114/83 | HR 88 | Temp 98.2°F | Resp 16 | Ht 66.0 in | Wt 167.6 lb

## 2023-11-16 DIAGNOSIS — E1165 Type 2 diabetes mellitus with hyperglycemia: Secondary | ICD-10-CM | POA: Diagnosis not present

## 2023-11-16 DIAGNOSIS — E876 Hypokalemia: Secondary | ICD-10-CM

## 2023-11-16 DIAGNOSIS — K219 Gastro-esophageal reflux disease without esophagitis: Secondary | ICD-10-CM | POA: Diagnosis not present

## 2023-11-16 DIAGNOSIS — A020 Salmonella enteritis: Secondary | ICD-10-CM | POA: Diagnosis not present

## 2023-11-16 DIAGNOSIS — F419 Anxiety disorder, unspecified: Secondary | ICD-10-CM

## 2023-11-16 DIAGNOSIS — I10 Essential (primary) hypertension: Secondary | ICD-10-CM | POA: Diagnosis not present

## 2023-11-16 DIAGNOSIS — H538 Other visual disturbances: Secondary | ICD-10-CM

## 2023-11-16 DIAGNOSIS — Z09 Encounter for follow-up examination after completed treatment for conditions other than malignant neoplasm: Secondary | ICD-10-CM

## 2023-11-16 DIAGNOSIS — Z0100 Encounter for examination of eyes and vision without abnormal findings: Secondary | ICD-10-CM

## 2023-11-16 DIAGNOSIS — E871 Hypo-osmolality and hyponatremia: Secondary | ICD-10-CM | POA: Diagnosis not present

## 2023-11-16 DIAGNOSIS — R2689 Other abnormalities of gait and mobility: Secondary | ICD-10-CM

## 2023-11-16 DIAGNOSIS — Z8572 Personal history of non-Hodgkin lymphomas: Secondary | ICD-10-CM

## 2023-11-16 DIAGNOSIS — F418 Other specified anxiety disorders: Secondary | ICD-10-CM

## 2023-11-16 DIAGNOSIS — R490 Dysphonia: Secondary | ICD-10-CM | POA: Diagnosis not present

## 2023-11-16 NOTE — Progress Notes (Signed)
 Patient ID: Hannah Frazier, female    DOB: 1950/09/30  MRN: [id number]  CC: Hospital Discharge Follow-Up  Subjective: Hannah Frazier is a 73 y.o. female who presents for hospital discharge follow-up.   Her concerns today include:  - Patient seen on 11/01/2023 - 11/05/2023 (4 days) at [facility]. Reports feeling overall improved. Denies red flag symptoms. States still having some imbalance and denies recent falls/trauma/injury. States she was cleared from Physical Therapy prior to hospital discharge. Doing well on medication regimen, no issues/concerns. She does not complain of red flag symptoms such as but not limited to chest pain, shortness of breath, worst headache of life, nausea/vomiting. She denies thoughts of self-harm, suicidal ideations, homicidal ideations. - Bilateral blurry vision. Denies red flag symptoms. States established with Ophthalmology.  - Hoarseness of voice persisting. Denies red flag symptoms. Established with ENT.   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 90 tablet 0   LORazepam  (ATIVAN ) 1 MG tablet Take 0.5 tablets (0.5 mg total) by mouth at bedtime as needed for anxiety (for sleep). 30 tablet 0   meloxicam  (MOBIC ) 15 MG tablet Take 1 tablet by mouth once a day with food (Patient taking differently: Take 15 mg by mouth as needed for pain.) 30 tablet 0   metroNIDAZOLE  (METROCREAM ) 0.75 % cream Apply dime-size amount to face twice daily and taper use to daily once improved. 45 g 2   ondansetron  (ZOFRAN ) 4 MG tablet Take 1 tablet (4 mg total) by mouth every 8 (eight) hours as needed for nausea or vomiting. 20 tablet 1   ONETOUCH ULTRA test strip USE TO TEST YOUR BLOOD SUGAR ONCE A DAY 100 each 2   pantoprazole  (PROTONIX ) 40 MG tablet Take 1 tablet (40 mg total) by mouth daily. (Patient not taking: Reported on 11/09/2023) 90 tablet 1   No current facility-administered medications on file prior to visit.    Allergies  Allergen Reactions   Simvastatin Swelling and Rash    Tongue swelling  Tongue swelling Tongue swelling    Lisinopril Nausea Only and Other (See Comments)   Sertraline Hcl Other (See Comments)    Social History   Socioeconomic History   Marital status: Divorced    Spouse name: Not on file   Number of children: Not on file   Years of education: Not on file   Highest education level: Some college, no degree  Occupational History  Not on file  Tobacco Use   Smoking status: Never   Smokeless tobacco: Never  Vaping Use   Vaping status: Never Used  Substance and Sexual Activity   Alcohol use: No   Drug use: No   Sexual activity: Not Currently  Other Topics Concern   Not on file  Social History Narrative   Not on file   Social Drivers of Health   Financial Resource Strain: Low Risk   (02/26/2023)   Overall Financial Resource Strain (CARDIA)    Difficulty of Paying Living Expenses: Not hard at all  Food Insecurity: No Food Insecurity (11/01/2023)   Hunger Vital Sign    Worried About Running Out of Food in the Last Year: Never true    Ran Out of Food in the Last Year: Never true  Transportation Needs: No Transportation Needs (11/01/2023)   PRAPARE - Administrator, Civil Service (Medical): No    Lack of Transportation (Non-Medical): No  Physical Activity: Inactive (02/26/2023)   Exercise Vital Sign    Days of Exercise per Week: 2 days    Minutes of Exercise per Session: 0 min  Stress: No Stress Concern Present (02/26/2023)   [employer] of Occupational Health - Occupational Stress Questionnaire    Feeling of Stress : Only a little  Social Connections: Moderately Integrated (11/01/2023)   Social Connection and Isolation Panel    Frequency of Communication with Friends and Family: More than three times a week    Frequency of Social Gatherings with Friends and Family: More than three times a week    Attends Religious Services: More than 4 times per year    Active Member of [employer] or Organizations: Yes    Attends Engineer, structural: More than 4 times per year    Marital Status: Divorced  Intimate Partner Violence: Not At Risk (11/01/2023)   Humiliation, Afraid, Rape, and Kick questionnaire    Fear of Current or Ex-Partner: No    Emotionally Abused: No    Physically Abused: No    Sexually Abused: No    Family History  Problem Relation Age of Onset   Cancer Mother        cervical ca   Parkinson's disease Mother    Heart disease Father    Diabetes Father    Hypertension Father    ADD / ADHD Son    ADD / ADHD Daughter    Anxiety disorder Son    Anxiety disorder Daughter    Stroke Brother     Past Surgical History:  Procedure Laterality Date   ABDOMINAL HYSTERECTOMY     APPENDECTOMY  1989   COLECTOMY      ROS: Review of  Systems Negative except as stated above  PHYSICAL EXAM: BP 114/83   Pulse 88   Temp 98.2 F (36.8 C) (Oral)   Resp 16   Ht 5' 6 (1.676 m)   Wt 167 lb 9.6 oz (76 kg)   SpO2 97%   BMI 27.05 kg/m   Physical Exam HENT:     Head: Normocephalic and atraumatic.     Nose: Nose normal.     Mouth/Throat:     Mouth: Mucous membranes are moist.     Pharynx: Oropharynx is clear.  Eyes:     Extraocular Movements: Extraocular movements intact.     Conjunctiva/sclera: Conjunctivae normal.     Pupils: Pupils are equal, round, and reactive to light.  Cardiovascular:     Rate and Rhythm:  Normal rate and regular rhythm.     Pulses: Normal pulses.     Heart sounds: Normal heart sounds.  Pulmonary:     Effort: Pulmonary effort is normal.     Breath sounds: Normal breath sounds.  Abdominal:     Frazier: Bowel sounds are normal.     Palpations: Abdomen is soft.  Musculoskeletal:        Frazier: Normal range of motion.     Cervical back: Normal range of motion and neck supple.  Neurological:     Frazier: No focal deficit present.     Mental Status: She is alert and oriented to person, place, and time.  Psychiatric:        Mood and Affect: Mood normal.        Behavior: Behavior normal.    ASSESSMENT AND PLAN: 1. Hospital discharge follow-up (Primary) - Reviewed hospital course, current medications, ensured proper follow-up in place, and addressed concerns.  - Routine screening.  - Basic Metabolic Panel - Magnesium - Phosphorus  2. Salmonella gastroenteritis - Continue present management (see #1).  3. Hyponatremia - Resolved prior to hospital discharge.  4. Hypokalemia 5. Hypophosphatemia - Routine screening (see #1).  6. Primary hypertension - Continue present management.  - Counseled on blood pressure goal of less than 130/80, low-sodium, DASH diet, medication compliance, and 150 minutes of moderate intensity exercise per week as tolerated. Counseled on medication adherence  and adverse effects. - Follow-up with primary provider as scheduled.   7. Type 2 diabetes mellitus with hyperglycemia, without long-term current use of insulin (HCC) - Hemoglobin A1c 6.2% on 11/02/2023. Goal 7%.  - Continue present management. - Discussed the importance of healthy eating habits, low-carbohydrate diet, low-sugar diet, regular aerobic exercise (at least 150 minutes a week as tolerated) and medication compliance to achieve or maintain control of diabetes. Counseled on medication adherence/adverse effects.  - Follow-up with primary provider as scheduled.   8. Anxiety and depression - Patient denies thoughts of self-harm, suicidal ideations, homicidal ideations. - Continue present management.  - Follow-up with primary provider as scheduled.   9. Gastroesophageal reflux disease, unspecified whether esophagitis present - Continue present management.  - Follow-up with primary provider as scheduled.  10. History of B-cell lymphoma - Keep all scheduled appointments with established Oncology.   11. Routine eye exam 12. Blurry vision, bilateral - Keep all scheduled appointments with established Ophthalmology.  13. Hoarseness of voice - Keep all scheduled appointments with established ENT.   14. Imbalance - Patient declined referral to Physical Therapy.    Patient was given the opportunity to ask questions.  Patient verbalized understanding of the plan and was able to repeat key elements of the plan. Patient was given clear instructions to go to Emergency Department or return to medical center if symptoms don't improve, worsen, or new problems develop.The patient verbalized understanding.   Orders Placed This Encounter  Procedures   Basic Metabolic Panel   Magnesium   Phosphorus    Return for Follow-up as needed with Raguel Blush, MD.  Greig JINNY Drones, NP

## 2023-11-16 NOTE — Progress Notes (Signed)
 Hospital follow up, patient having trouble keeping her balance when walking,  vision has been affected,  patient voice is hoarse

## 2023-11-17 ENCOUNTER — Ambulatory Visit: Payer: Self-pay | Admitting: Family

## 2023-11-17 LAB — BASIC METABOLIC PANEL WITH GFR
BUN/Creatinine Ratio: 8 — ABNORMAL LOW (ref 12–28)
BUN: 7 mg/dL — ABNORMAL LOW (ref 8–27)
CO2: 23 mmol/L (ref 20–29)
Calcium: 9.5 mg/dL (ref 8.7–10.3)
Chloride: 97 mmol/L (ref 96–106)
Creatinine, Ser: 0.83 mg/dL (ref 0.57–1.00)
Glucose: 126 mg/dL — ABNORMAL HIGH (ref 70–99)
Potassium: 4.4 mmol/L (ref 3.5–5.2)
Sodium: 136 mmol/L (ref 134–144)
eGFR: 75 mL/min/1.73 (ref >59)

## 2023-11-17 LAB — PHOSPHORUS: Phosphorus: 3.5 mg/dL (ref 3.0–4.3)

## 2023-11-17 LAB — MAGNESIUM: Magnesium: 2.3 mg/dL (ref 1.6–2.3)

## 2023-11-18 ENCOUNTER — Other Ambulatory Visit (HOSPITAL_COMMUNITY): Payer: Self-pay

## 2023-11-18 MED ORDER — LORAZEPAM 1 MG PO TABS
0.5000 mg | ORAL_TABLET | Freq: Every evening | ORAL | 0 refills | Status: DC | PRN
  Filled 2023-11-18: qty 30, 60d supply, fill #0

## 2023-11-21 ENCOUNTER — Other Ambulatory Visit: Payer: Self-pay

## 2023-12-05 LAB — MISCELLANEOUS TEST

## 2023-12-27 ENCOUNTER — Other Ambulatory Visit (HOSPITAL_COMMUNITY): Payer: Self-pay

## 2023-12-27 ENCOUNTER — Other Ambulatory Visit: Payer: Self-pay

## 2023-12-28 ENCOUNTER — Other Ambulatory Visit (HOSPITAL_COMMUNITY): Payer: Self-pay

## 2023-12-28 ENCOUNTER — Other Ambulatory Visit: Payer: Self-pay

## 2023-12-29 ENCOUNTER — Other Ambulatory Visit: Payer: Self-pay

## 2023-12-30 ENCOUNTER — Other Ambulatory Visit (HOSPITAL_COMMUNITY): Payer: Self-pay

## 2023-12-31 ENCOUNTER — Other Ambulatory Visit (HOSPITAL_COMMUNITY): Payer: Self-pay

## 2023-12-31 MED ORDER — METRONIDAZOLE 0.75 % EX CREA
1.0000 | TOPICAL_CREAM | Freq: Two times a day (BID) | CUTANEOUS | 2 refills | Status: AC
  Filled 2023-12-31: qty 45, 30d supply, fill #0

## 2024-01-20 ENCOUNTER — Other Ambulatory Visit: Payer: Self-pay | Admitting: Family Medicine

## 2024-01-20 DIAGNOSIS — F419 Anxiety disorder, unspecified: Secondary | ICD-10-CM

## 2024-01-23 ENCOUNTER — Other Ambulatory Visit (HOSPITAL_COMMUNITY): Payer: Self-pay

## 2024-01-23 MED ORDER — LORAZEPAM 1 MG PO TABS
0.5000 mg | ORAL_TABLET | Freq: Every evening | ORAL | 0 refills | Status: AC | PRN
  Filled 2024-01-23: qty 30, 60d supply, fill #0

## 2024-01-23 NOTE — Telephone Encounter (Signed)
 Complete

## 2024-01-24 ENCOUNTER — Other Ambulatory Visit: Payer: Self-pay

## 2024-02-01 ENCOUNTER — Inpatient Hospital Stay: Attending: Hematology and Oncology

## 2024-02-01 ENCOUNTER — Ambulatory Visit (HOSPITAL_COMMUNITY)
Admission: RE | Admit: 2024-02-01 | Discharge: 2024-02-01 | Disposition: A | Discharge status: Home / Self Care | Source: Ambulatory Visit | Attending: Hematology and Oncology | Admitting: Hematology and Oncology

## 2024-02-01 DIAGNOSIS — Z8572 Personal history of non-Hodgkin lymphomas: Secondary | ICD-10-CM | POA: Insufficient documentation

## 2024-02-01 DIAGNOSIS — Z79899 Other long term (current) drug therapy: Secondary | ICD-10-CM | POA: Insufficient documentation

## 2024-02-01 DIAGNOSIS — R109 Unspecified abdominal pain: Secondary | ICD-10-CM | POA: Insufficient documentation

## 2024-02-01 DIAGNOSIS — E119 Type 2 diabetes mellitus without complications: Secondary | ICD-10-CM | POA: Insufficient documentation

## 2024-02-01 DIAGNOSIS — C8513 Unspecified B-cell lymphoma, intra-abdominal lymph nodes: Secondary | ICD-10-CM | POA: Insufficient documentation

## 2024-02-01 LAB — CBC WITH DIFFERENTIAL/PLATELET
Abs Immature Granulocytes: 0.01 K/uL (ref 0.00–0.07)
Basophils Absolute: 0.1 K/uL (ref 0.0–0.1)
Basophils Relative: 1 %
Eosinophils Absolute: 0.2 K/uL (ref 0.0–0.5)
Eosinophils Relative: 3 %
HCT: 44.3 % (ref 36.0–46.0)
Hemoglobin: 15 g/dL (ref 12.0–15.0)
Immature Granulocytes: 0 %
Lymphocytes Relative: 38 %
Lymphs Abs: 2.7 K/uL (ref 0.7–4.0)
MCH: 28.5 pg (ref 26.0–34.0)
MCHC: 33.9 g/dL (ref 30.0–36.0)
MCV: 84.2 fL (ref 80.0–100.0)
Monocytes Absolute: 0.5 K/uL (ref 0.1–1.0)
Monocytes Relative: 6 %
Neutro Abs: 3.6 K/uL (ref 1.7–7.7)
Neutrophils Relative %: 52 %
Platelets: 264 K/uL (ref 150–400)
RBC: 5.26 MIL/uL — ABNORMAL HIGH (ref 3.87–5.11)
RDW: 13.3 % (ref 11.5–15.5)
WBC: 7.1 K/uL (ref 4.0–10.5)
nRBC: 0 % (ref 0.0–0.2)

## 2024-02-01 LAB — COMPREHENSIVE METABOLIC PANEL WITH GFR
ALT: 15 U/L (ref 0–44)
AST: 25 U/L (ref 15–41)
Albumin: 4.3 g/dL (ref 3.5–5.0)
Alkaline Phosphatase: 87 U/L (ref 38–126)
Anion gap: 9 (ref 5–15)
BUN: 11 mg/dL (ref 8–23)
CO2: 29 mmol/L (ref 22–32)
Calcium: 9.6 mg/dL (ref 8.9–10.3)
Chloride: 102 mmol/L (ref 98–111)
Creatinine, Ser: 0.93 mg/dL (ref 0.44–1.00)
GFR, Estimated: >60 mL/min
Glucose, Bld: 120 mg/dL — ABNORMAL HIGH (ref 70–99)
Potassium: 4.1 mmol/L (ref 3.5–5.1)
Sodium: 139 mmol/L (ref 135–145)
Total Bilirubin: 1 mg/dL (ref 0.0–1.2)
Total Protein: 8.1 g/dL (ref 6.5–8.1)

## 2024-02-01 MED ORDER — IOHEXOL 9 MG/ML PO SOLN: 500.0000 mL | ORAL | Status: AC

## 2024-02-01 MED ORDER — IOHEXOL 300 MG/ML  SOLN
100.0000 mL | Freq: Once | INTRAMUSCULAR | Status: AC | PRN
  Administered 2024-02-01: 100 mL via INTRAVENOUS

## 2024-02-02 NOTE — Progress Notes (Signed)
 Hannah Frazier                                          MRN: [id number]   02/02/2024   The [facility] Quality Team Specialist reviewed this patient medical record for the purposes of chart review for care gap closure. The following were reviewed: abstraction for care gap closure-transition of care:notification of admission, receipt of discharge, patient engagement, and medication reconciliation post discharge.    [facility] Quality Team

## 2024-02-09 ENCOUNTER — Encounter: Payer: Self-pay | Admitting: Hematology and Oncology

## 2024-02-09 ENCOUNTER — Inpatient Hospital Stay: Admitting: Hematology and Oncology

## 2024-02-09 VITALS — BP 139/73 | HR 89 | Temp 98.1°F | Resp 18 | Ht 66.0 in | Wt 172.8 lb

## 2024-02-09 DIAGNOSIS — Z8572 Personal history of non-Hodgkin lymphomas: Secondary | ICD-10-CM | POA: Diagnosis not present

## 2024-02-09 DIAGNOSIS — E119 Type 2 diabetes mellitus without complications: Secondary | ICD-10-CM

## 2024-02-09 NOTE — Assessment & Plan Note (Addendum)
 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 I will see her again in a year for further follow-up with labs only

## 2024-02-09 NOTE — Progress Notes (Signed)
 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 I will see her again in a year for further follow-up with labs only Type 2 diabetes mellitus without complication, without long-term current use of insulin  (HCC) The patient is prediabetic She is currently not on medications We discussed risk and benefits consideration for metformin  No orders of the defined types were placed in this encounter.    Almarie Bedford, MD  INTERVAL HISTORY: she returns for surveillance follow-up for history of lymphoma She was recently admitted to the hospital in October 2025 for infection I reviewed documentation, blood work and imaging She is not doing well I reviewed labs and CT imaging from January 2025  PHYSICAL EXAMINATION: ECOG PERFORMANCE STATUS: 0 - Asymptomatic  Vitals:   02/09/24 1200  BP: 139/73  Pulse: 89  Resp: 18  Temp: 98.1 F (36.7 C)  SpO2: 99%   Filed Weights   02/09/24 1200  Weight: 172 lb 12.8 oz (78.4 kg)    Relevant data reviewed during this visit included CBC, CMP, CT imaging

## 2024-02-09 NOTE — Assessment & Plan Note (Addendum)
 The patient is prediabetic She is currently not on medications We discussed risk and benefits consideration for metformin

## 2024-03-30 ENCOUNTER — Ambulatory Visit: Admitting: Hematology and Oncology

## 2024-08-14 ENCOUNTER — Ambulatory Visit: Admitting: Hematology and Oncology

## 2024-08-14 ENCOUNTER — Other Ambulatory Visit

## 2025-02-11 ENCOUNTER — Inpatient Hospital Stay

## 2025-02-11 ENCOUNTER — Inpatient Hospital Stay: Admitting: Hematology and Oncology
